# Patient Record
Sex: Male | Born: 1935 | Race: White | Hispanic: No | Smoking: Former smoker
Health system: Southern US, Community
[De-identification: ages and names within clinical notes are randomized; demographics above are authoritative.]

## PROBLEM LIST (undated history)

## (undated) DIAGNOSIS — Z9981 Dependence on supplemental oxygen: Secondary | ICD-10-CM

## (undated) DIAGNOSIS — I10 Essential (primary) hypertension: Secondary | ICD-10-CM

## (undated) DIAGNOSIS — I739 Peripheral vascular disease, unspecified: Secondary | ICD-10-CM

## (undated) DIAGNOSIS — C159 Malignant neoplasm of esophagus, unspecified: Secondary | ICD-10-CM

## (undated) DIAGNOSIS — J449 Chronic obstructive pulmonary disease, unspecified: Secondary | ICD-10-CM

## (undated) DIAGNOSIS — R2689 Other abnormalities of gait and mobility: Secondary | ICD-10-CM

## (undated) DIAGNOSIS — E538 Deficiency of other specified B group vitamins: Secondary | ICD-10-CM

## (undated) DIAGNOSIS — R911 Solitary pulmonary nodule: Secondary | ICD-10-CM

## (undated) DIAGNOSIS — I6529 Occlusion and stenosis of unspecified carotid artery: Secondary | ICD-10-CM

## (undated) DIAGNOSIS — D649 Anemia, unspecified: Secondary | ICD-10-CM

## (undated) DIAGNOSIS — I82409 Acute embolism and thrombosis of unspecified deep veins of unspecified lower extremity: Secondary | ICD-10-CM

## (undated) DIAGNOSIS — I219 Acute myocardial infarction, unspecified: Secondary | ICD-10-CM

## (undated) DIAGNOSIS — I2510 Atherosclerotic heart disease of native coronary artery without angina pectoris: Secondary | ICD-10-CM

## (undated) HISTORY — DX: Chronic obstructive pulmonary disease, unspecified: J44.9

## (undated) HISTORY — DX: Acute myocardial infarction, unspecified: I21.9

## (undated) HISTORY — DX: Other abnormalities of gait and mobility: R26.89

## (undated) HISTORY — DX: Atherosclerotic heart disease of native coronary artery without angina pectoris: I25.10

## (undated) HISTORY — PX: VASCULAR SURGERY: SHX849

## (undated) HISTORY — PX: PR VEIN BYPASS GRAFT,AORTO-FEM-POP: 35551

## (undated) HISTORY — DX: Solitary pulmonary nodule: R91.1

## (undated) HISTORY — DX: Occlusion and stenosis of unspecified carotid artery: I65.29

## (undated) HISTORY — DX: Acute embolism and thrombosis of unspecified deep veins of unspecified lower extremity: I82.409

---

## 1984-03-30 DIAGNOSIS — I219 Acute myocardial infarction, unspecified: Secondary | ICD-10-CM

## 1984-03-30 HISTORY — DX: Acute myocardial infarction, unspecified: I21.9

## 2001-01-14 ENCOUNTER — Ambulatory Visit (HOSPITAL_COMMUNITY): Admission: RE | Admit: 2001-01-14 | Discharge: 2001-01-14 | Payer: Self-pay | Admitting: Family Medicine

## 2002-12-18 ENCOUNTER — Ambulatory Visit (HOSPITAL_COMMUNITY): Admission: RE | Admit: 2002-12-18 | Discharge: 2002-12-18 | Payer: Self-pay | Admitting: Cardiovascular Disease

## 2002-12-18 ENCOUNTER — Encounter: Payer: Self-pay | Admitting: Cardiovascular Disease

## 2003-01-31 ENCOUNTER — Inpatient Hospital Stay (HOSPITAL_COMMUNITY): Admission: RE | Admit: 2003-01-31 | Discharge: 2003-02-02 | Payer: Self-pay

## 2006-03-30 HISTORY — PX: LUNG SURGERY: SHX703

## 2006-10-11 ENCOUNTER — Encounter: Payer: Self-pay | Admitting: Pulmonary Disease

## 2006-12-27 ENCOUNTER — Encounter: Payer: Self-pay | Admitting: Pulmonary Disease

## 2007-01-06 ENCOUNTER — Ambulatory Visit: Payer: Self-pay | Admitting: Pulmonary Disease

## 2007-01-10 ENCOUNTER — Encounter: Payer: Self-pay | Admitting: Pulmonary Disease

## 2007-01-10 ENCOUNTER — Ambulatory Visit: Admission: RE | Admit: 2007-01-10 | Discharge: 2007-01-10 | Payer: Self-pay | Admitting: Pulmonary Disease

## 2007-01-25 ENCOUNTER — Encounter: Payer: Self-pay | Admitting: Pulmonary Disease

## 2007-01-25 ENCOUNTER — Ambulatory Visit (HOSPITAL_COMMUNITY): Admission: RE | Admit: 2007-01-25 | Discharge: 2007-01-25 | Payer: Self-pay | Admitting: Pulmonary Disease

## 2007-01-28 ENCOUNTER — Ambulatory Visit: Payer: Self-pay | Admitting: Pulmonary Disease

## 2007-02-09 ENCOUNTER — Ambulatory Visit: Payer: Self-pay | Admitting: Thoracic Surgery

## 2007-02-21 ENCOUNTER — Inpatient Hospital Stay (HOSPITAL_COMMUNITY): Admission: RE | Admit: 2007-02-21 | Discharge: 2007-03-08 | Payer: Self-pay | Admitting: Thoracic Surgery

## 2007-02-21 ENCOUNTER — Ambulatory Visit: Payer: Self-pay | Admitting: Pulmonary Disease

## 2007-02-21 ENCOUNTER — Encounter: Payer: Self-pay | Admitting: Thoracic Surgery

## 2007-02-22 ENCOUNTER — Ambulatory Visit: Payer: Self-pay | Admitting: Thoracic Surgery

## 2007-03-11 ENCOUNTER — Inpatient Hospital Stay (HOSPITAL_COMMUNITY): Admission: EM | Admit: 2007-03-11 | Discharge: 2007-03-14 | Payer: Self-pay | Admitting: Emergency Medicine

## 2007-03-16 DIAGNOSIS — R0989 Other specified symptoms and signs involving the circulatory and respiratory systems: Secondary | ICD-10-CM

## 2007-03-16 DIAGNOSIS — J449 Chronic obstructive pulmonary disease, unspecified: Secondary | ICD-10-CM

## 2007-03-16 DIAGNOSIS — I219 Acute myocardial infarction, unspecified: Secondary | ICD-10-CM | POA: Insufficient documentation

## 2007-03-16 DIAGNOSIS — R0609 Other forms of dyspnea: Secondary | ICD-10-CM

## 2007-03-16 DIAGNOSIS — J984 Other disorders of lung: Secondary | ICD-10-CM

## 2007-03-17 ENCOUNTER — Ambulatory Visit: Payer: Self-pay | Admitting: Thoracic Surgery

## 2007-03-17 ENCOUNTER — Encounter: Admission: RE | Admit: 2007-03-17 | Discharge: 2007-03-17 | Payer: Self-pay | Admitting: Thoracic Surgery

## 2007-03-18 ENCOUNTER — Inpatient Hospital Stay (HOSPITAL_COMMUNITY): Admission: AD | Admit: 2007-03-18 | Discharge: 2007-03-23 | Payer: Self-pay | Admitting: Thoracic Surgery

## 2007-03-29 ENCOUNTER — Encounter: Admission: RE | Admit: 2007-03-29 | Discharge: 2007-03-29 | Payer: Self-pay | Admitting: Thoracic Surgery

## 2007-03-29 ENCOUNTER — Ambulatory Visit: Payer: Self-pay | Admitting: Thoracic Surgery

## 2007-03-31 HISTORY — PX: CORONARY ARTERY BYPASS GRAFT: SHX141

## 2007-03-31 HISTORY — PX: CAROTID ENDARTERECTOMY: SUR193

## 2007-04-06 ENCOUNTER — Ambulatory Visit: Payer: Self-pay | Admitting: Thoracic Surgery

## 2007-04-06 ENCOUNTER — Encounter: Admission: RE | Admit: 2007-04-06 | Discharge: 2007-04-06 | Payer: Self-pay | Admitting: Thoracic Surgery

## 2007-04-11 ENCOUNTER — Ambulatory Visit: Payer: Self-pay | Admitting: Pulmonary Disease

## 2007-04-15 ENCOUNTER — Telehealth (INDEPENDENT_AMBULATORY_CARE_PROVIDER_SITE_OTHER): Payer: Self-pay | Admitting: *Deleted

## 2007-04-25 ENCOUNTER — Telehealth (INDEPENDENT_AMBULATORY_CARE_PROVIDER_SITE_OTHER): Payer: Self-pay | Admitting: *Deleted

## 2007-05-01 ENCOUNTER — Ambulatory Visit: Payer: Self-pay | Admitting: Emergency Medicine

## 2007-05-01 ENCOUNTER — Inpatient Hospital Stay (HOSPITAL_COMMUNITY): Admission: EM | Admit: 2007-05-01 | Discharge: 2007-05-19 | Payer: Self-pay | Admitting: Emergency Medicine

## 2007-05-03 ENCOUNTER — Encounter: Payer: Self-pay | Admitting: Emergency Medicine

## 2007-05-04 ENCOUNTER — Encounter: Payer: Self-pay | Admitting: Internal Medicine

## 2007-05-05 ENCOUNTER — Ambulatory Visit: Payer: Self-pay | Admitting: Infectious Disease

## 2007-05-09 ENCOUNTER — Ambulatory Visit: Payer: Self-pay | Admitting: Internal Medicine

## 2007-05-27 ENCOUNTER — Ambulatory Visit: Payer: Self-pay | Admitting: Gastroenterology

## 2007-06-07 ENCOUNTER — Encounter: Admission: RE | Admit: 2007-06-07 | Discharge: 2007-06-07 | Payer: Self-pay | Admitting: Thoracic Surgery

## 2007-06-07 ENCOUNTER — Ambulatory Visit: Payer: Self-pay | Admitting: Thoracic Surgery

## 2007-06-22 ENCOUNTER — Ambulatory Visit: Payer: Self-pay | Admitting: Pulmonary Disease

## 2007-07-06 ENCOUNTER — Ambulatory Visit: Payer: Self-pay | Admitting: Pulmonary Disease

## 2007-07-19 ENCOUNTER — Telehealth: Payer: Self-pay | Admitting: Pulmonary Disease

## 2007-07-20 ENCOUNTER — Ambulatory Visit: Payer: Self-pay | Admitting: Pulmonary Disease

## 2007-07-20 DIAGNOSIS — I5021 Acute systolic (congestive) heart failure: Secondary | ICD-10-CM

## 2007-07-25 ENCOUNTER — Ambulatory Visit: Payer: Self-pay | Admitting: Pulmonary Disease

## 2007-08-04 ENCOUNTER — Encounter: Admission: RE | Admit: 2007-08-04 | Discharge: 2007-08-04 | Payer: Self-pay | Admitting: Cardiovascular Disease

## 2007-08-09 ENCOUNTER — Ambulatory Visit: Payer: Self-pay | Admitting: Thoracic Surgery (Cardiothoracic Vascular Surgery)

## 2007-08-09 ENCOUNTER — Encounter: Payer: Self-pay | Admitting: Thoracic Surgery (Cardiothoracic Vascular Surgery)

## 2007-08-09 ENCOUNTER — Inpatient Hospital Stay (HOSPITAL_COMMUNITY): Admission: AD | Admit: 2007-08-09 | Discharge: 2007-08-10 | Payer: Self-pay | Admitting: Cardiovascular Disease

## 2007-08-10 ENCOUNTER — Encounter: Payer: Self-pay | Admitting: Thoracic Surgery (Cardiothoracic Vascular Surgery)

## 2007-08-15 ENCOUNTER — Ambulatory Visit: Payer: Self-pay | Admitting: Thoracic Surgery (Cardiothoracic Vascular Surgery)

## 2007-08-17 ENCOUNTER — Inpatient Hospital Stay (HOSPITAL_COMMUNITY)
Admission: RE | Admit: 2007-08-17 | Discharge: 2007-08-29 | Payer: Self-pay | Admitting: Thoracic Surgery (Cardiothoracic Vascular Surgery)

## 2007-09-19 ENCOUNTER — Ambulatory Visit: Payer: Self-pay | Admitting: Thoracic Surgery (Cardiothoracic Vascular Surgery)

## 2007-09-19 ENCOUNTER — Encounter
Admission: RE | Admit: 2007-09-19 | Discharge: 2007-09-19 | Payer: Self-pay | Admitting: Thoracic Surgery (Cardiothoracic Vascular Surgery)

## 2007-09-22 ENCOUNTER — Encounter (HOSPITAL_COMMUNITY): Admission: RE | Admit: 2007-09-22 | Discharge: 2007-11-11 | Payer: Self-pay | Admitting: Cardiovascular Disease

## 2007-10-21 ENCOUNTER — Ambulatory Visit: Payer: Self-pay | Admitting: Pulmonary Disease

## 2007-10-21 DIAGNOSIS — J9611 Chronic respiratory failure with hypoxia: Secondary | ICD-10-CM

## 2007-11-03 ENCOUNTER — Encounter: Payer: Self-pay | Admitting: Pulmonary Disease

## 2007-12-21 ENCOUNTER — Encounter: Admission: RE | Admit: 2007-12-21 | Discharge: 2007-12-21 | Payer: Self-pay | Admitting: Cardiovascular Disease

## 2007-12-27 ENCOUNTER — Ambulatory Visit (HOSPITAL_COMMUNITY): Admission: RE | Admit: 2007-12-27 | Discharge: 2007-12-28 | Payer: Self-pay | Admitting: Cardiovascular Disease

## 2008-01-31 ENCOUNTER — Inpatient Hospital Stay (HOSPITAL_COMMUNITY): Admission: RE | Admit: 2008-01-31 | Discharge: 2008-02-01 | Payer: Self-pay | Admitting: Cardiovascular Disease

## 2008-02-20 ENCOUNTER — Ambulatory Visit: Payer: Self-pay | Admitting: Pulmonary Disease

## 2008-03-27 ENCOUNTER — Emergency Department (HOSPITAL_COMMUNITY): Admission: EM | Admit: 2008-03-27 | Discharge: 2008-03-27 | Payer: Self-pay | Admitting: Emergency Medicine

## 2008-08-21 ENCOUNTER — Ambulatory Visit: Payer: Self-pay | Admitting: Pulmonary Disease

## 2008-09-14 ENCOUNTER — Ambulatory Visit (HOSPITAL_COMMUNITY): Admission: RE | Admit: 2008-09-14 | Discharge: 2008-09-14 | Payer: Self-pay | Admitting: Cardiovascular Disease

## 2009-02-18 ENCOUNTER — Ambulatory Visit: Payer: Self-pay | Admitting: Pulmonary Disease

## 2009-03-28 ENCOUNTER — Ambulatory Visit (HOSPITAL_COMMUNITY): Admission: RE | Admit: 2009-03-28 | Discharge: 2009-03-28 | Payer: Self-pay | Admitting: Cardiovascular Disease

## 2009-04-17 ENCOUNTER — Encounter: Admission: RE | Admit: 2009-04-17 | Discharge: 2009-04-17 | Payer: Self-pay | Admitting: Cardiovascular Disease

## 2009-04-22 ENCOUNTER — Ambulatory Visit (HOSPITAL_COMMUNITY): Admission: RE | Admit: 2009-04-22 | Discharge: 2009-04-22 | Payer: Self-pay | Admitting: Cardiovascular Disease

## 2009-07-02 ENCOUNTER — Ambulatory Visit: Payer: Self-pay | Admitting: Pulmonary Disease

## 2009-07-05 IMAGING — CT NM PET TUM IMG SKULL BASE T - THIGH
6 series · 25 of 25 positions shown · IV contrast (350 OM)
Comparison: Outside CT report from [HOSPITAL] dated 12/27/2006

FDG PET-CT TUMOR IMAGING (WHOLE BODY):

Fasting Blood Glucose:  107

CLINICAL DATA: Possible left upper lobe lung nodule on outside CT
TECHNIQUE: 16.6 mCi F-18 FDG was injected intravenously via the right a.c. . 
Full-ring PET imaging was performed from the skull base through the lower
extremities 78 minutes after injection.  CT data was obtained and used for
attenuation correction and anatomic localization only.  (This was not acquired
as a diagnostic CT examination.)

[Series 1: pet ac · axial · 3.3mm · 4.69mm/px · z∈[-870,+0]mm · 5 of 267 slices shown]
[im 1/267]
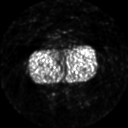
[im 67/267]
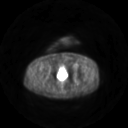
[im 134/267]
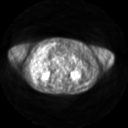
[im 200/267]
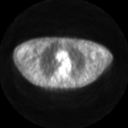
[im 267/267]
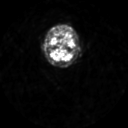

[Series 2: ct images · axial · 3.8mm · 0.98mm/px · z∈[-870,+0]mm · 5 of 267 slices shown]
[im 1/267]
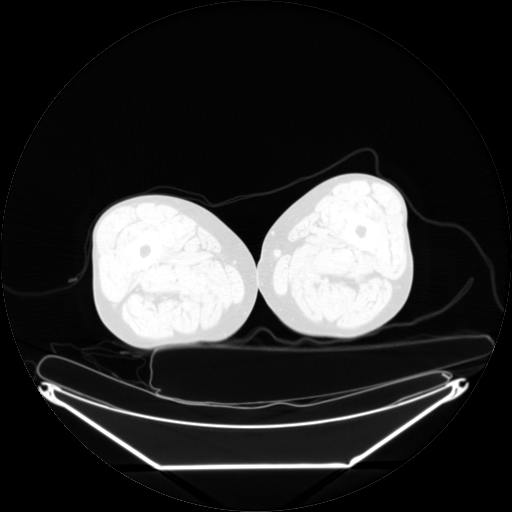
[im 67/267]
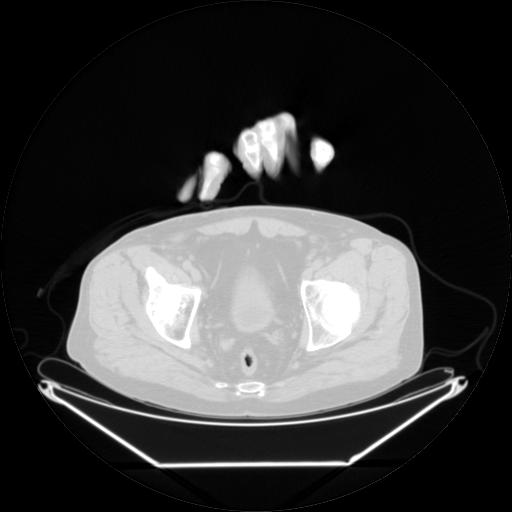
[im 134/267]
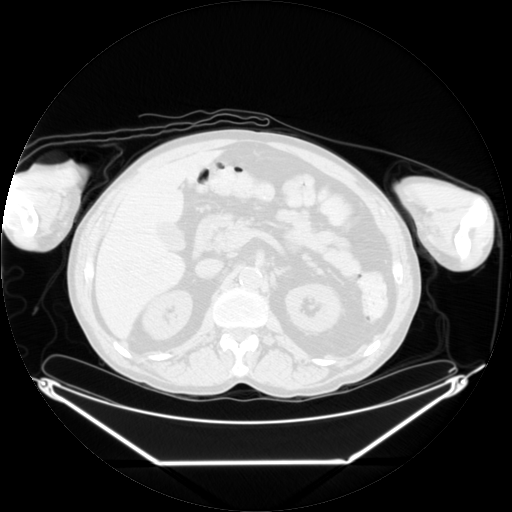
[im 200/267]
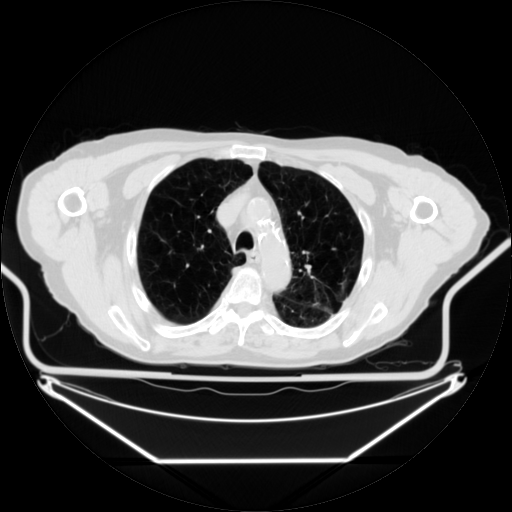
[im 267/267]
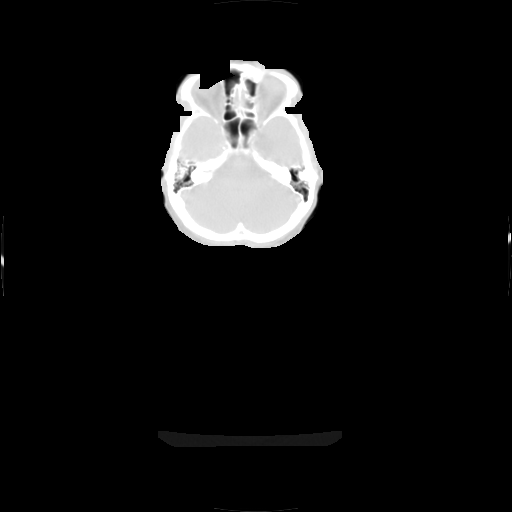

[Series 2: pet nac · axial · 3.3mm · 4.69mm/px · z∈[-870,+0]mm · 6 of 267 slices shown]
[im 1/267]
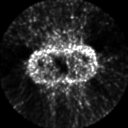
[im 54/267]
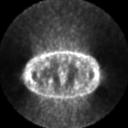
[im 107/267]
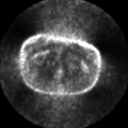
[im 160/267]
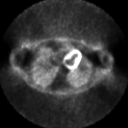
[im 213/267]
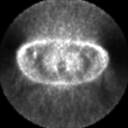
[im 267/267]
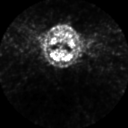

[Series 123: mip · coronal · 3.3mm · 4.69mm/px · 1 of 30 slices shown]
[im 1/30]
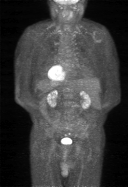

[Series 151: reformatted · axial · 3.3mm · 3.91mm/px · z∈[-870,+0]mm · 6 of 265 slices shown (1 of 2)]
[im 1/265]
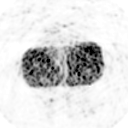
[im 53/265]
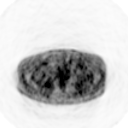
[im 106/265]
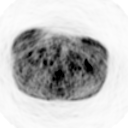
[im 159/265]
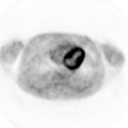
[im 212/265]
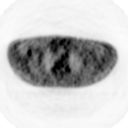
[im 265/265]
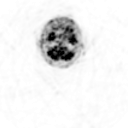

[Series 153: reformatted · coronal · 4.7mm · 6.98mm/px · 2 of 77 slices shown (2 of 2)]
[im 1/77]
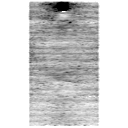
[im 77/77]
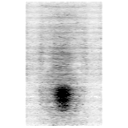

[25 of 25 positions shown; findings below may reference images not displayed]

FINDINGS: PET images demonstrate mild asymmetric right greater than left
palatine tonsil activity, likely physiologic, without correlative CT mass.

Extensive hypermetabolic-Brown fat. Apparent activity at the left posterior
first rib is felt to be related to surrounding hypermetabolic fat. Less marked
hypermetabolism surrounds more inferior posterior ribs as well. Degenerative
metabolism at the right glenohumeral joint.

Corresponding to a spiculated left upper lobe lung nodule at approximately 1.0 x
1.2 cm is hypermetabolism 2.8 g/mL. Image 63. Immediately medially is a probable
satellite nodule at 6 mm.

More posteriorly and inferiorly in the left upper lobe is a spiculated nodule
measuring 5 mm on image 70. This is faintly visible on PET at 1.1 g/mL.

A low left mediastinal node measures 9 mm on image 89. Mild hypermetabolism
g/mL. Slightly hypermetabolic to the surrounding mediastinal pool.

No activity within the right adrenal adenoma.

Two foci of hypermetabolism within the rectosigmoid region. The more superior
measures 4.2 and corresponds to an area of underdistention. Just inferior to
this is a focal area of hypermetabolism at 4.5 grams per milliliter. Concurrent
soft tissue density on image 193 could relate to retained stool or a small
polyp.

CT images performed for attenuation correction demonstrate no significant
findings in the neck. Moderate coronary artery atherosclerosis.

Moderate centrilobular emphysema. 4 mm posterior right upper lobe lung nodule on
image 79.

Right adrenal nodule measures approximately -8 Hounsfield units, consistent with
an adenoma. Tiny gallstones. Mild bladder wall thickening. Moderate
prostatomegaly. Right inguinal hernia contains fat.
IMPRESSION: 1. The largest left upper lobe lung nodule demonstrates malignant range activity
suspicious for bronchogenic carcinoma. A immediately adjacent smaller nodule is
likely a satellite.
2. More lateral left upper lobe smaller nodule may be too small to entirely
characterized by PET. A intrapulmonary metastasis cannot be excluded.
3. Isolated equivocal left mediastinal lymph node warrants followup attention.
4. No extra thoracic disease.
5. Right adrenal adenoma.
6. Right upper lobe 4 mm lung nodule, not mentioned on outside CT report.
7. Two foci of hypermetabolism within the rectosigmoid colon. these could be
physiologic, given their focality, polyps cannot be excluded. Correlate with
gastrointestinal symptoms and possibly sigmoidoscopy or colonoscopy.

## 2009-08-19 ENCOUNTER — Ambulatory Visit: Payer: Self-pay | Admitting: Pulmonary Disease

## 2010-02-18 ENCOUNTER — Telehealth: Payer: Self-pay | Admitting: Pulmonary Disease

## 2010-04-09 ENCOUNTER — Ambulatory Visit
Admission: RE | Admit: 2010-04-09 | Discharge: 2010-04-09 | Payer: Self-pay | Source: Home / Self Care | Attending: Pulmonary Disease | Admitting: Pulmonary Disease

## 2010-04-20 ENCOUNTER — Encounter: Payer: Self-pay | Admitting: Thoracic Surgery

## 2010-04-29 NOTE — Assessment & Plan Note (Signed)
Summary: rov for emphysema/chronic respiratory failure.   Copy to:  Allyson Sabal Primary Provider/Referring Provider:  Aida Puffer,  [place], [place]  CC:  Pt is here for a 6 month f/u appt.  Pt saw TP for a sick visit on 07-02-2009.  Pt c/o sob with exertion and also feels "dizzy while walking."   Pt c/o coughing up cream colored sputum.   Pt denied chest pain or tightness in chest.  .  History of Present Illness: the patient comes in today for followup of his known emphysema with chronic respiratory failure.he was recently seen by our nurse practitioner for atypical chest pain, but responded quite well to nonsteroidals and a heating pad. His chest discomfort has totally resolved. The patient states that his dyspnea on exertion is at his usual baseline, and he only has an intermittent cough with occasional cream colored sputum production. He is wearing his oxygen compliantly, and is staying on his usual bronchodilator regimen.  Current Medications (verified): 1)  Advair Diskus 250-50 Mcg/dose  Misc (Fluticasone-Salmeterol) .... Inhale 1 Puff Two Times A Day 2)  Proair Hfa 108 (90 Base) Mcg/act  Aers (Albuterol Sulfate) .... Inhale 2 Puffs Every 4 To 6 Hours As Needed 3)  Metoprolol Tartrate 25 Mg  Tabs (Metoprolol Tartrate) .... Take 1/2 Tab By Mouth Two Times A Day 4)  Lanoxin 0.125 Mg  Tabs (Digoxin) .... Take 1 Tablet By Mouth Once A Day 5)  Lipitor 40 Mg Tabs (Atorvastatin Calcium) .[name].. 1 By Mouth Once Daily 6)  Plavix 75 Mg Tabs (Clopidogrel Bisulfate) .... Take 1 Tablet By Mouth Once A Day 7)  Mobic 15 Mg Tabs (Meloxicam) .... Take 1 Tablet By Mouth Once A Day 8)  Folic Acid 1 Mg Tabs (Folic Acid) .... Take 1 Tablet By Mouth Once A Day 9)  Aspirin Low Dose 81 Mg Tabs (Aspirin) .... Take 1 Tablet By Mouth Once A Day 10)  Albuterol Sulfate (2.5 Mg/67ml) 0.083% Nebu (Albuterol Sulfate) .... Four Times A Day in Nebulizer As Needed  Allergies (verified): No Known Drug Allergies  Review of Systems    The patient complains of shortness of breath with activity, productive cough, acid heartburn, and sneezing.  The patient denies shortness of breath at rest, non-productive cough, coughing up blood, chest pain, irregular heartbeats, indigestion, loss of appetite, weight change, abdominal pain, difficulty swallowing, sore throat, tooth/dental problems, headaches, nasal congestion/difficulty breathing through nose, itching, ear ache, anxiety, depression, hand/feet swelling, joint stiffness or pain, rash, change in color of mucus, and fever.    Vital Signs:  Patient profile:   77 year old male Height:      67 inches Weight:      174 pounds BMI:     27.35 O2 Sat:      95 % on 3 LPM pulsed Temp:     97.6 degrees F oral Pulse rate:   69 / minute BP sitting:   134 / 68  (left arm) Cuff size:   regular  Vitals Entered By: Arman Filter LPN (Aug 19, 2009 11:07 AM)  O2 Flow:  3 LPM pulsed CC: Pt is here for a 6 month f/u appt.  Pt saw TP for a sick visit on 07-02-2009.  Pt c/o sob with exertion and also feels "dizzy while walking."   Pt c/o coughing up cream colored sputum.   Pt denied chest pain or tightness in chest.   Comments Medications reviewed with patient Arman Filter LPN  Aug 19, 2009 11:07 AM  Physical Exam  General:  ow male in nad Lungs:  decreased bs, but no wheezing or rhonchi Heart:  rrr, no mrg Extremities:  no edema or cyanosis Neurologic:  alert and oriented, moves all 4.   Impression & Recommendations:  Problem # 1:  EMPHYSEMA (ICD-492.8) the patient is maintaining a stable baseline on his current treatment. He has had no recent acute exacerbation or pulmonary infection. I think the most important thing that he can do at this point is to work aggressively on a conditioning and exercise program. He has been through pulmonary rehabilitation at one point, and thought it helped quite a bit. I have offered to refer him back for another session. I have recommended no change in  his current medications, and he is to followup with me in 6 months.  Medications Added to Medication List This Visit: 1)  Albuterol Sulfate (2.5 Mg/8ml) 0.083% Nebu (Albuterol sulfate) .... Four times a day in nebulizer as needed  Other Orders: Est. Patient Level III (62952) Prescription Created Electronically [phone])  Patient Instructions: 1)  no change in breathing medications 2)  think about pulmonary rehab, and will refer you. 3)  followup with me in 6mos.  Prescriptions: PROAIR HFA 108 (90 BASE) MCG/ACT  AERS (ALBUTEROL SULFATE) inhale 2 puffs every 4 to 6 hours as needed  #1 x 6   Entered and Authorized by:   Barbaraann Share MD   Signed by:   Barbaraann Share MD on 08/19/2009   Method used:   Faxed to ...       [facility] (retail)       [address]       [place], [place]  [address]       Ph: [phone] or [phone]       Fax: [phone]   RxID:   [id number] ADVAIR DISKUS 250-50 MCG/DOSE  MISC (FLUTICASONE-SALMETEROL) Inhale 1 puff two times a day  #1 x 6   Entered and Authorized by:   Barbaraann Share MD   Signed by:   Barbaraann Share MD on 08/19/2009   Method used:   Faxed to ...       [facility] (retail)       [address]       [place], [place]  [address]       Ph: [phone] or [phone]       Fax: [phone]   RxID:   [id number]

## 2010-04-29 NOTE — Assessment & Plan Note (Signed)
Summary: Acute NP office visit - "lung pain"   Copy to:  Allyson Sabal Primary Provider/Referring Provider:  Aida Puffer,  [place], [place]  CC:  sharp, tight pain in chest that pt states wrapped all the way around his ribs that pt rates as a 10 out of 10; occured Wednesday night that lasted x56min irritated with movement, no episodes since but having residual pains.  pt denies any changes in his breathing that include dyspnea, and wheezing or cough.  .  History of Present Illness: 77 yo with known hx emphysema with chronic respiratory failure.    July 02, 2009 --Presents for an acute office  visit. Complains of sharp, tight pain in chest that pt states wrapped all the way around his ribs that pt rates as a 10 out of 10; occured 1 week ago,  that lasted x41min irritated with movement, no episodes since but having residual pains.  pt denies any changes in his breathing that include dyspnea, wheezing or cough.  Would like tylox- it worked well 2 years ago,  took mobic without much help. Felt like a  tight band around ribs 1 week ago -lasted 5 min then resolved. Worse with movement. Now ribs are sore, w/ coughing. Feels much better. Breathing is at baseline. No xray >2 yrs. Denies orthopnea, hemoptysis, fever, n/v/d, edema, headache,recent travel or antibiotics.  Medications Prior to Update: 1)  Advair Diskus 250-50 Mcg/dose  Misc (Fluticasone-Salmeterol) .... Inhale 1 Puff Two Times A Day 2)  Proair Hfa 108 (90 Base) Mcg/act  Aers (Albuterol Sulfate) .... Inhale 2 Puffs Every 4 To 6 Hours As Needed 3)  Metoprolol Tartrate 25 Mg  Tabs (Metoprolol Tartrate) .... Take 1/2 Tab By Mouth Two Times A Day 4)  Lanoxin 0.125 Mg  Tabs (Digoxin) .... Take 1 Tablet By Mouth Once A Day 5)  Lipitor 40 Mg Tabs (Atorvastatin Calcium) .[name].. 1 By Mouth Once Daily 6)  Plavix 75 Mg Tabs (Clopidogrel Bisulfate) .... Take 1 Tablet By Mouth Once A Day 7)  Mobic 15 Mg Tabs (Meloxicam) .... Take 1 Tablet By Mouth Once A Day 8)  Folic  Acid 1 Mg Tabs (Folic Acid) .... Take 1 Tablet By Mouth Once A Day 9)  Aspirin Low Dose 81 Mg Tabs (Aspirin) .... Take 1 Tablet By Mouth Once A Day 10)  Albuterol Sulfate (2.5 Mg/62ml) 0.083% Nebu (Albuterol Sulfate) .... Four Times A Day in Nebulizer 11)  Budesonide .... Two Times A Day in Nebulizer  Current Medications (verified): 1)  Advair Diskus 250-50 Mcg/dose  Misc (Fluticasone-Salmeterol) .... Inhale 1 Puff Two Times A Day 2)  Proair Hfa 108 (90 Base) Mcg/act  Aers (Albuterol Sulfate) .... Inhale 2 Puffs Every 4 To 6 Hours As Needed 3)  Metoprolol Tartrate 25 Mg  Tabs (Metoprolol Tartrate) .... Take 1/2 Tab By Mouth Two Times A Day 4)  Lanoxin 0.125 Mg  Tabs (Digoxin) .... Take 1 Tablet By Mouth Once A Day 5)  Lipitor 40 Mg Tabs (Atorvastatin Calcium) .[name].. 1 By Mouth Once Daily 6)  Plavix 75 Mg Tabs (Clopidogrel Bisulfate) .... Take 1 Tablet By Mouth Once A Day 7)  Mobic 15 Mg Tabs (Meloxicam) .... Take 1 Tablet By Mouth Once A Day 8)  Folic Acid 1 Mg Tabs (Folic Acid) .... Take 1 Tablet By Mouth Once A Day 9)  Aspirin Low Dose 81 Mg Tabs (Aspirin) .... 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, inflammatory, +MAC EMPHYSEMA (ICD-492.8) DYSPNEA ON EXERTION (ICD-786.09) MYOCARDIAL INFARCTION (ICD-410.90)  Family History: Last updated: 07/02/2009 heart disease - father DM - sister  Social History: Last updated: 07/02/2009 former smoker, quit 2008 x26yrs 2ppd drinks alcohol occasionally divorced 5 children retired: Public house manager  Risk Factors: Smoking Status: quit (04/11/2007) Packs/Day: 1 1/2 ppd (03/16/2007)  Family History: heart disease - father DM - sister  Social History: former  smoker, quit 2008 x20yrs 2ppd drinks alcohol occasionally divorced 5 children retired: Public house manager  Review of Systems      See HPI  Vital Signs:  Patient profile:   77 year old male Height:      67 inches Weight:      176 pounds BMI:     27.67 O2 Sat:      98 % on 3 L/min pulsing Temp:     96.8 degrees F oral Pulse rate:   74 / minute BP sitting:   126 / 70  (left arm) Cuff size:   regular  Vitals Entered By: Boone Master CNA (July 02, 2009 11:42 AM)  O2 Flow:  3 L/min pulsing CC: sharp, tight pain in chest that pt states wrapped all the way around his ribs that pt rates as a 10 out of 10; occured Wednesday night that lasted x59min irritated with movement, no episodes since but having residual pains.  pt denies any changes in his breathing that include dyspnea, wheezing or cough.   Is Patient Diabetic? No Comments Medications reviewed with patient Daytime contact number verified with patient. Boone Master CNA  July 02, 2009 11:43 AM    Physical Exam  Additional Exam:  GEN: A/Ox3; pleasant , NAD HEENT:  [place]/AT, , EACs-clear, TMs-wnl, NOSE-clear, THROAT-clear NECK:  Supple w/ fair ROM; no JVD; normal carotid impulses w/o bruits; no thyromegaly or nodules palpated; no lymphadenopathy. RESP  Clear to P & A; w/o, wheezes/ rales/ or rhonchi., decreased bases  CARD:  RRR, no m/r/g   GI:   Soft & nt; nml bowel sounds; no organomegaly or masses detected. Musco: Warm bil,  no calf tenderness edema, clubbing, pulses intact, tender along posterior back, no eccymosis or rash.  Neuro: intact w/ no focal deficits noted.     Impression & Recommendations:  Problem # 1:  EMPHYSEMA (ICD-492.8) Atypical chest pain suspect musculoskeletal in nature. check cxr today COPD appears to be stable.  REC:  Warm heat to back.  Advil 200mg  2 tabs two times a day for 5 days, take w/ food.  Please contact office for sooner follow up if symptoms do not improve or worsen  follow up Dr.  Shelle Iron as scheduled.    Orders: T-2 View CXR (71020TC) Est. Patient Level IV (84132)  Complete Medication List: 1)  Advair Diskus 250-50 Mcg/dose Misc (Fluticasone-salmeterol) .... Inhale 1 puff two times a day 2)  Proair Hfa 108 (90 Base) Mcg/act Aers (Albuterol sulfate) .... Inhale 2 puffs every 4 to 6 hours as needed 3)  Metoprolol Tartrate 25 Mg Tabs (Metoprolol tartrate) .... Take 1/2 tab by mouth two times a day 4)  Lanoxin 0.125 Mg Tabs (Digoxin) .... Take 1 tablet by mouth once a day 5)  Lipitor 40 Mg Tabs (Atorvastatin calcium) .[name].. 1 by mouth once daily 6)  Plavix 75 Mg Tabs (Clopidogrel bisulfate) .Marland KitchenMarland KitchenMarland Kitchen  Take 1 tablet by mouth once a day 7)  Mobic 15 Mg Tabs (Meloxicam) .... Take 1 tablet by mouth once a day 8)  Folic Acid 1 Mg Tabs (Folic acid) .... Take 1 tablet by mouth once a day 9)  Aspirin Low Dose 81 Mg Tabs (Aspirin) .... Take 1 tablet by mouth once a day 10)  Albuterol Sulfate (2.5 Mg/16ml) 0.083% Nebu (Albuterol sulfate) .... Four times a day in nebulizer  Patient Instructions: 1)  Warm heat to back.  2)  Advil 200mg  2 tabs two times a day for 5 days, take w/ food.  3)  Please contact office for sooner follow up if symptoms do not improve or worsen  4)  follow up Dr. Shelle Iron as scheduled.

## 2010-04-29 NOTE — Progress Notes (Signed)
Summary: nos appt  Phone Note Call from Patient   Caller: [email or web address]  Call For: clance Summary of Call: In ref to 11/21 nos, pt has appt on 04/09/2010. Initial call taken by: Darletta Moll,  February 18, 2010 9:08 AM

## 2010-05-01 NOTE — Assessment & Plan Note (Signed)
Summary: rov for emphysema   Visit Type:  Follow-up Copy to:  Allyson Sabal Primary Provider/Referring Provider:  Aida Puffer,  [place], [place]  CC:  follow up. Pt states his breathing has unchanged. pt denies any cough. Still has occas wheezing. [name]  History of Present Illness: the pt comes in today for f/u of his known emphysema.  He is continuing on his bronchodilators and oxygen, and is trying to stay active.  He feels he is doing fairly well overall, maintaining a functional baseline.  He denies any chest congestion or purulent mucus.  Current Medications (verified): 1)  Advair Diskus 250-50 Mcg/dose  Misc (Fluticasone-Salmeterol) .... Inhale 1 Puff Two Times A Day 2)  Proair Hfa 108 (90 Base) Mcg/act  Aers (Albuterol Sulfate) .... Inhale 2 Puffs Every 4 To 6 Hours As Needed 3)  Lanoxin 0.125 Mg  Tabs (Digoxin) .... Take 1 Tablet By Mouth Once A Day 4)  Plavix 75 Mg Tabs (Clopidogrel Bisulfate) .... Take 1 Tablet By Mouth Once A Day 5)  Mobic 15 Mg Tabs (Meloxicam) .... Take 1 Tablet By Mouth Once A Day 6)  Folic Acid 1 Mg Tabs (Folic Acid) .... Take 1 Tablet By Mouth Once A Day 7)  Aspirin Low Dose 81 Mg Tabs (Aspirin) .... Take 1 Tablet By Mouth Once A Day 8)  Albuterol Sulfate (2.5 Mg/88ml) 0.083% Nebu (Albuterol Sulfate) .... Four Times A Day in Nebulizer As Needed  Allergies (verified): No Known Drug Allergies  Review of Systems       The patient complains of shortness of breath with activity, hand/feet swelling, and joint stiffness or pain.  The patient denies shortness of breath at rest, productive cough, non-productive cough, coughing up blood, chest pain, irregular heartbeats, acid heartburn, indigestion, loss of appetite, weight change, abdominal pain, difficulty swallowing, sore throat, tooth/dental problems, headaches, nasal congestion/difficulty breathing through nose, sneezing, itching, ear ache, anxiety, depression, rash, change in color of mucus, and fever.    Vital  Signs:  Patient profile:   77 year old male Height:      67 inches Weight:      178.38 pounds BMI:     28.04 O2 Sat:      90 % on 3 L/min pulsed Temp:     98.3 degrees F oral Pulse rate:   77 / minute BP sitting:   120 / 68  (left arm) Cuff size:   regular  Vitals Entered By: Carver Fila (April 09, 2010 2:26 PM)  O2 Flow:  3 L/min pulsed CC: follow up. Pt states his breathing has unchanged. pt denies any cough. Still has occas wheezing.  Comments meds and allergies updated Phone number updated Carver Fila  April 09, 2010 2:27 PM    Physical Exam  General:  thin male in nad Lungs:  decreased bs with faint basilar crackles. no wheezing noted. Heart:  rrr Extremities:  no significant edema or cyanosis  Neurologic:  alert and oriented, moves all 4.   Impression & Recommendations:  Problem # 1:  EMPHYSEMA (ICD-492.8)  the pt is maintaining a stable baseline on his current meds.  I have stressed the need to work on some type of conditioning program in order to maximize QOL.    Problem # 2:  CHRONIC RESPIRATORY FAILURE (ICD-518.83)  adequate sats on current oxygen therapy  Other Orders: Est. Patient Level III (40981)  Patient Instructions: 1)  no change in meds 2)  try to stay as active as possible. 3)  followup with  me in 6mos or sooner if having issues.   Immunization History:  Influenza Immunization History:    Influenza:  historical (03/18/2010)

## 2010-08-12 NOTE — Letter (Signed)
March 29, 2007   Barbaraann Share, MD,FCCP  [address]  [place], [place] [address]   Re:  [name], ANACKER                  DOB:  Jun 02, 1931   Dear Mellody Dance:   I saw the patient back today after three hospitalizations.  It looks  like his lung is healed.  We removed his last chest tube sutures.  He is  breathing better.  He is off oxygen except at night.  His blood sats  were 92%, his blood pressure was 116/77, pulse 100, respirations 18, and  chest x-ray showed just normal postoperative changes.  I will plan to  see him back again in 1 week to continue a close followup.   [name], M.D.  Electronically Signed   DPB/MEDQ  D:  03/29/2007  T:  03/29/2007  Job:  [id number]

## 2010-08-12 NOTE — Op Note (Signed)
[name], [name]                ACCOUNT NO.:  [id number]   MEDICAL RECORD NO.:  [id number]          PATIENT TYPE:  INP   LOCATION:  2306                         FACILITY:  [facility]   PHYSICIAN:  Salvatore Decent. Cornelius Moras, M.D. DATE OF BIRTH:  [birth year]   DATE OF PROCEDURE:  08/17/2007  DATE OF DISCHARGE:                               OPERATIVE REPORT   PREOPERATIVE DIAGNOSES:  1. Left main disease with three-vessel coronary artery disease.  2. Ischemic cardiomyopathy.   POSTOPERATIVE DIAGNOSES:  1. Left main disease with three-vessel coronary artery disease.  2. Ischemic cardiomyopathy.   PROCEDURE:  Median sternotomy for coronary artery bypass grafting x3  (left internal mammary artery to distal left anterior descending  coronary artery, saphenous vein graft to circumflex marginal branch,  saphenous vein graft to right posterolateral branch, and endoscopic  saphenous vein harvest from right thigh)   SURGEON:  Salvatore Decent. Cornelius Moras, MD   ASSISTANTS:  1. Salvatore Decent Dorris Fetch, MD  2. Theda Belfast, PA   ANESTHESIA:  General.   BRIEF CLINICAL NOTE:  The patient is a 77 year old male with known  history of coronary artery disease, peripheral vascular disease,  hypertension, hyperlipidemia, and congestive heart failure.  The patient  suffered a myocardial infarction many years ago and was treated  medically at that time.  In November 2008, the patient underwent left  VATS and thoracotomy for wedge resection of what turned out to be a  benign left upper lobe lung lesion.  Following this, the patient had a  very long and complicated postoperative convalescence culminating in an  episode of severe sepsis and congestive heart failure complicated by  acute respiratory failure and acute renal failure.  This all developed  over the course of several months.  Ultimately, the patient recovered.  During his convalescence, echocardiogram was performed demonstrating  global left ventricular  dysfunction with ejection fraction estimated  25%.  The patient subsequently underwent elective cardiac  catheterization by Dr. Nanetta Batty.  He was found to have critical  left main disease with three-vessel coronary artery disease and ischemic  cardiomyopathy.  A full consultation note has been dictated previously.  The patient has been counseled at length regarding the indications,  risks, and potential benefits of surgery.  Alternative treatment  strategies have been discussed.  He understands and accepts all  associated risks of surgery and desires to proceed as described.   OPERATIVE FINDINGS:  1. Moderate-to-severe left ventricular dysfunction with ejection      fraction estimated 25%.  2. Trace mitral regurgitation.  3. Good quality left internal mammary artery and saphenous vein      conduit for grafting.  4. Good quality target vessels for grafting.   OPERATIVE NOTE IN DETAIL:  The patient was brought to the operating room  on above-mentioned date and central monitoring was established by the  anesthesia service under the care and direction of Dr. Kipp Brood.  Specifically, a Swan-Ganz catheter was placed through the right internal  jugular approach.  A radial arterial line was placed.  Intravenous  antibiotics were administered.  Following induction with  general  endotracheal anesthesia, a Foley catheter was placed.  The patient's  chest, abdomen, both groins, and both lower extremities were prepared  and draped in a sterile manner.  Baseline transesophageal echocardiogram  was performed by Dr. Noreene Larsson.  This demonstrates global left ventricular  dysfunction with moderately dilated left ventricle.  The inferior wall  appears akinetic.  Ejection fraction is estimated 25%.  There is trace  mitral regurgitation.  No other significant abnormalities were noted.   The median sternotomy incision was performed and left internal mammary  artery was dissected from the chest wall  and prepared for bypass  grafting.  The left internal mammary artery was good-quality conduit.  Simultaneously, saphenous vein was obtained from the patient's right  thigh using endoscopic vein harvest technique through a small incision  made just below the right knee.  The saphenous vein was good-quality  conduit.  After the saphenous vein had been removed, the small incision  in the right lower extremities closed in multiple layers with running  absorbable suture.  The patient was heparinized systemically.  The left  internal mammary artery was transected distally and noted to have  excellent flow.   The pericardium was opened.  There was severe atherosclerotic plaque in  the transverse aortic arch, but that the ascending thoracic aorta  appears fairly normal.  The ascending thoracic aorta was cannulated  uneventfully.  A venous cannula was placed through the tip of the right  atrium.  A retrograde cardioplegic catheter was placed through the right  atrium into the coronary sinus.  Cardiopulmonary bypass was begun and  distal target sites were selected for coronary bypass grafting.  There  was some scar involving the inferior wall of the heart consistent with  remote history of myocardial infarction.  A temperature probe was placed  in left ventricular septum and a cardioplegic catheter was placed in the  ascending aorta.   The patient was allowed to cool passively to 32 degrees systemic  temperature.  The aortic crossclamp was applied and cold blood  cardioplegia is delivered initially in antegrade fashion through the  aortic root.  Iced saline slush was applied for topical hypothermia and  supplemental cardioplegia was administered retrograde through the  coronary sinus catheter to maintain left ventricular septal temperature  below 15 degrees centigrade.  The intermittent doses of cardioplegia  administered intermittently throughout the crossclamp portion of the  operation through  the aortic root, down the subsequently placed vein  grafts, and retrograde through the coronary sinus catheter.  The initial  cardioplegic arrest and myocardial cooling was felt be excellent.   The following distal coronary anastomoses were performed:  1. The circumflex marginal branch is grafted with a saphenous vein      graft in end-to-side fashion.  This vessel measures 2.0 mm in      diameter and is a good-quality target vessel for grafting.  2. The posterolateral branch of the distal right coronary artery is      grafted with saphenous vein graft in end-to-side fashion.  This      vessel measured 1.9 mm in diameter and is a good-quality target      vessel for grafting.  3. The left anterior descending coronary artery is grafted with left      internal mammary artery in end-to-side fashion.  This vessel      measured 2.0 mm in diameter and is a good-quality target vessel for      grafting.   Both  proximal saphenous vein anastomoses were performed directly to the  ascending aorta prior to removal of the aortic crossclamp.  The left  ventricular septal temperature rises rapidly with reperfusion of the  left internal mammary artery.  One final dose of warm retrograde hot  shot cardioplegia was administered.  The aortic crossclamp was removed  after a total crossclamp time of 50 minutes.  The heart begins to beat  spontaneously and a slow junctional escape rhythm resumes.  All proximal  and distal coronary anastomoses were inspected for hemostasis and  appropriate graft orientation.  Epicardial pacing wires fixed to the  right ventricular free wall into the right atrial appendage.  The  patient was rewarmed to 37 degrees centigrade temperature.  Low-dose  milrinone and dopamine infusions are begun.  The patient was weaned from  cardiopulmonary bypass without difficulty.  The patient's rhythm at  separation from bypass is a slow junctional escape rhythm.  AV  sequential pacing was  employed.  Total cardiopulmonary bypass time of  the operation is 65 minutes.   Followup transesophageal echocardiogram performed by Dr. Noreene Larsson after  separation from bypass demonstrates some improvement in left ventricular  function.  No other abnormalities are noted.   The venous and arterial cannulae were removed uneventfully.  Protamine  was administered to reverse anticoagulation.  The mediastinum and the  left chest were irrigated with saline solution containing vancomycin.  Meticulous surgical hemostasis was ascertained.  The mediastinum and  left chest were drained with three chest tubes exited through separate  stab incisions.  The pericardium soft tissues anterior to the aorta were  reapproximated loosely.  The sternum was closed with double-strength  sternal wire.  The soft tissues anterior to the sternum were closed in  multiple layers and the skin was closed with a running subcuticular skin  closure.   The patient tolerated the procedure well and was transported to the  surgical intensive care unit in stable condition.  There were no  intraoperative complications.  All sponge, instrument, and needle counts  were verified and correct at the completion of the operation.  The  patient was transfused 2 units of packed red blood cells during  cardiopulmonary bypass due to anemia, which was present prior to surgery  and exacerbated with hemodilution.      Salvatore Decent. Cornelius Moras, M.D.  Electronically Signed     CHO/MEDQ  D:  08/17/2007  T:  08/17/2007  Job:  [id number]   cc:   Nanetta Batty, M.D.  Barbaraann Share, MD,FCCP  Dr. Thora Lance, M.D.

## 2010-08-12 NOTE — Assessment & Plan Note (Signed)
[facility]                             PULMONARY OFFICE NOTE   NAME:[name], [name]                         MRN:          [id number]  DATE:01/06/2007                            DOB:          [birth year]    HISTORY OF PRESENT ILLNESS:  The patient is a very pleasant 77 year old  gentleman who I have been asked to see for an abnormal CT scan as well  as dyspnea.  The patient has had an abnormal area in the left upper lobe  that was noted on prior CT scan.  The patient subsequently has had a 2-  to 34-month followup and shows increasing size of the density as well as  spiculation that is concerning for possible malignancy.  The patient has  had no chest pain or hemoptysis and has had an excellent appetite.  He  has not been losing weight.  He has no significant cough but does  produce white foamy mucus throughout the day.  The patient also has a  long history of smoking and significant dyspnea on exertion.  He states  that he would probably get short of breath at one block on flat ground  at a moderate pace.  However, he has no difficulties with his easier day-  to-day activities.  He also notes day-to-day variability.   PAST MEDICAL HISTORY:  Significant for:  1. Myocardial infarction in 1986.  2. History of what sounds like a vascular occlusion that failed      interventional approach and required vascular surgery approximately      3+ years ago.   CURRENT MEDICATIONS:  1. Albuterol inhaler two puffs daily.  2. Flovent HFA two puffs b.i.d.   The patient has no known drug allergies.   SOCIAL HISTORY:  He has a history of smoking one-and-a-half packs per  day or more for the last 56 years.  He is retired from [employer] work and  lives alone.   FAMILY HISTORY:  Unknown to him.   REVIEW OF SYSTEMS:  As per history of present illness.  Also see patient  intake form documented in the chart.   PHYSICAL EXAMINATION:  GENERAL:  He is a well-developed male  in no acute  distress.  Blood pressure is 112/80, pulse 111, temperature 98.2, weight  is 156 pounds, he is 5 feet 7 inches tall, O2 saturation on room air  95%.  HEENT:  Pupils equal, round, and reactive to light and accommodation.  Extraocular muscles are intact.  Nares are patent without discharge.  Oropharynx is clear.  NECK:  Supple without JVD or lymphadenopathy.  There is no palpable  thyromegaly.  CHEST:  Reveals very decreased breath sounds in all lung fields but no  wheezes.  CARDIAC:  Reveals a mildly tachycardic but regular rhythm.  ABDOMEN:  Soft, nontender, with good bowel sounds.  GENITAL EXAM, RECTAL EXAM, BREAST EXAM:  Not done and not indicated.  LOWER EXTREMITIES:  Without edema.  Pulses are intact distally but  diminished.  NEUROLOGIC:  He is alert and oriented with no obvious motor deficits.   IMPRESSION:  1. Abnormal density noted on CT scan report from December 27, 2006,      which shows a density in the left upper lobe that is increasing in      size and has associated spiculation.  Unfortunately, this is not      available to me but sounds somewhat worrisome for malignancy.  I      have asked the patient to go by [facility] and get a disk and      bring to the office.  We have called them ahead of time to have it      prepared for him.  If he truly does have a lesion that is      suspicious for cancer and has grown in size, we typically treat      this as cancer until proven otherwise and have it resected.      However, the patient clearly has emphysema of unknown severity, and      if his lung function is poor enough we would like to try and      establish a diagnosis of cancer prior to possible resection.  I      would like to go ahead and get pulmonary function studies to get      some idea as to the severity of his emphysema and if significant      would follow through with a PET scan.  If the patient has adequate      pulmonary function for  resection, and the lesion is indeed      concerning for cancer, will go ahead and refer to thoracic surgery      for resection.  2. Emphysema with significant dyspnea on exertion.  I would like to      try the patient on a long-acting cholinergic as well as a long-      acting beta agonist and see if we can make some difference.  I have      stressed to him the importance of smoking cessation.   PLAN:  1. Obtain disk from [facility] to better look at the films.  2. Schedule for full pulmonary function studies.  3. Will start on Spiriva one puff q.a.m. and Symbicort 160/4.5 two      b.i.d.  He is to stop Flovent HFA and is to use his albuterol      inhaler only on a p.r.n. basis for rescue.  4. Stop smoking.  5. The patient will follow up after the above.     Barbaraann Share, MD,FCCP  Electronically Signed    [facility]/MedQ  DD: 01/06/2007  DT: 01/06/2007  Job #: [id number]   cc:   Aida Puffer

## 2010-08-12 NOTE — Discharge Summary (Signed)
[name], BERROCAL                ACCOUNT NO.:  [id number]   MEDICAL RECORD NO.:  [id number]          PATIENT TYPE:  INP   LOCATION:  6527                         FACILITY:  [facility]   PHYSICIAN:  Lezlie Octave, N.P.     DATE OF BIRTH:  1931/12/23   DATE OF ADMISSION:  08/09/2007  DATE OF DISCHARGE:  08/10/2007                               DISCHARGE SUMMARY   Mr. Oregel is a 77 year old male patient who came in for elective cath  by Dr. Nanetta Batty.  This was performed on Aug 09, 2007.  He had 80%  left main and 100% chronically occluded RCA with left to right  collaterals.  He did have a positive Myoview prior to his procedure.  Cardiovascular surgical consult was called.  He was seen by Dr. Tressie Stalker.  The patient had a long hospitalization back in February with  history of mycobacterium avium.  He had a VATS procedure by Dr. Edwyna Shell.  Dr. Freida Busman wanted him to get PFTs, which were performed prior to his  discharge.  He also had pre-Doppler procedures.  He had bilateral  moderate  heterogeneous diffuse plaque in bifurcation ICA.  He really  had no ICA stenosis.  His vertebral artery flow was antegrade, left was  abnormal wave forms.  His upper extremity palmar arches were within  normal limits and his right ABI waveforms demonstrated normal at rest  but his left ABI Doppler waveform demonstrated mild reduction of flow  with an ABI of 0.62.  An echo was performed also, which showed again an  EF 25-30%.  He had severe diffuse left ventricular hypokinesis.  He had  mildly reduced aortic valve leaflets with excursion.  He had mild MVR.  He had a small pericardial effusion circumferential to his heart.   Discharge medications are:  1. Lipitor 20 mg a day.  2. Advair 250/50 twice a day.  3. Aspirin 81 mg two per day.  4. Spiriva 80 mcg a day.  5. Furosemide 20 mg twice per day.  6. Potassium chloride 20 mEq once a day.  7. Metoprolol 12.5 mg twice a day.  8. Digoxin 0.125 mg once  a day.  9. Nitroglycerin 1/150 one at a time as needed for chest pain.  He did      have an episode of a decreased blood pressure at midnight and he      was given a bolus of fluid.  The following morning, his blood      pressures were okay and he was given his regular medications.      Blood pressure being 118/62, pulse was 63, and temperature was      96.8.  It was planned that he would follow up with Dr. Cornelius Moras on Aug 15, 2007, that will be Monday at 3:00 p.m. to discuss timing of his      surgery.  I do not have the results of the PFTs at this time.   DISCHARGE DIAGNOSES:  1. Positive Myoview with subsequent cardiac catheterization showing      left  main 80% and chronically occluded 100% RCA with left to right      collaterals.  2. Ischemic cardiomyopathy with an EF of 25-30% per echo done on Aug 10, 2007.  3. Hyperlipidemia.  4. Recent lobectomy for Mycobacterium avium complex.  5. Recent ventilator-dependent respiratory failure.  6. Chronic obstructive pulmonary disease.  7.  8. History of arteriosclerotic peripheral vascular disease.  9. History of congestive heart failure.  10.History of anemia.  11.Recent history of sepsis.  12.Recent history of acute renal failure.  13.Peripheral peripheral vascular disease.  14.Status post left femoral popliteal bypass for superficial femoral      artery occlusion.      Lezlie Octave, N.P.     BB/MEDQ  D:  08/10/2007  T:  08/11/2007  Job:  [id number]   cc:   Peyton Najjar A. Freida Busman, MD

## 2010-08-12 NOTE — Discharge Summary (Signed)
NAMEARNEL, [name]                ACCOUNT NO.:  [id number]   MEDICAL RECORD NO.:  [id number]          PATIENT TYPE:  INP   LOCATION:  3733                         FACILITY:  [facility]   PHYSICIAN:  Nanetta Batty, M.D.   DATE OF BIRTH:  1931-05-02   DATE OF ADMISSION:  01/31/2008  DATE OF DISCHARGE:  02/01/2008                               DISCHARGE SUMMARY   DISCHARGE DIAGNOSES:  1. Atherosclerotic peripheral vascular disease on this admission with      status post left superficial femoral artery percutaneous      transluminal angioplasty and stent secondary to claudication and      decreased ankle-brachial indices.  Previous attempts at same lesion      had failed.  This admission, I used a different wire system with a      successful procedure.  2. A prior medical history of left femoral-popliteal bypass grafting.  3. Atherosclerotic cardiovascular disease with history of coronary      bypass grafting x3 with left internal mammary artery to his left      anterior descending, saphenous vein graft to his obtuse marginal      artery and posterior descending artery in 2009.  4. Chronic obstructive pulmonary disease.  5. Severe left ventricular dysfunction/ischemic cardiomyopathy with an      ejection fraction of 25-35% with last Myoview on November 03, 2007,      showed an ejection fraction of 45% and no ischemia.  6. Hyperlipidemia.  7. History of congestive heart failure.  8. History of left upper lobe nodule which is noncancerous per old      chart.   LABORATORY:  Sodium 140, potassium 4.6, chloride 107, CO2 26, glucose  96, BUN 17, and creatinine 1.36.  Hemoglobin 11.6, hematocrit 34.9, WBCs  10.3, and platelets 283.   MEDICATIONS:  1. Lipitor 20 mg every day.  2. Advair 250/50 twice a day.  3. Aspirin 81 mg a day.  4. Spiriva every day.  5. Metoprolol 25 mg half twice a day.  6. Lanoxin 0.125 mg a day.  7. Lasix 20 mg a dat.  8. KCl 20 mEq every day.  9. ProAir p.r.n.  10.Altace 2.5 mg a day.  11.Oxygen as needed.  12.Plavix 75 mg every day was added.  13.He can take Pepcid 20 mg a day which he can buy over the counter at      the pharmacy if the stomach hurts.  He has had intolerance to full      dose aspirin in the past.  14.He is now on Plavix also.   OTHER DISCHARGE INSTRUCTIONS:  He should do no straining, lifting,  pushing, pulling, or exercise for a week.  He can drive in 2 days.  If  he has any problems with his groin, he will call us.  He has an  appointment for lower extremity Dopplers on February 21, 2008, at 10:30  and then to see Dr. Allyson Sabal on February 22, 2008 at 10:45.   HOSPITAL COURSE:  Mr. Karen Kinnard was admitted because of claudication  and elective re-attempt at  PTA and stent of his left SFA, which is known  occluded.  His only other option is to have another surgery, which he  would be high risk because of his heart disease.  Procedure was  performed by Dr. Allyson Sabal with a different catheter system, apparently with  the help of Dr. Jacinto Halim.  The procedure was successful.  He was seen by  Dr. Allyson Sabal on February 01, 2008, and he was stable and considered able to  be discharged home.      Lezlie Octave, N.P.      Nanetta Batty, M.D.  Electronically Signed    BB/MEDQ  D:  02/01/2008  T:  02/02/2008  Job:  [id number]   cc:   Barbaraann Share, MD,FCCP  Gabriel Earing, M.D.

## 2010-08-12 NOTE — Op Note (Signed)
[name], [name]                ACCOUNT NO.:  [id number]   MEDICAL RECORD NO.:  [id number]          PATIENT TYPE:  INP   LOCATION:  2899                         FACILITY:  [facility]   PHYSICIAN:  [name], M.D. DATE OF BIRTH:  04/18/1931   DATE OF PROCEDURE:  02/21/2007  DATE OF DISCHARGE:                               OPERATIVE REPORT   PREOPERATIVE DIAGNOSIS:  Left upper lobe mass, PET positive.   POSTOPERATIVE DIAGNOSIS:  Inflammatory mass left upper lobe, PET  positive.   OPERATION PERFORMED:  Left video assisted thoracoscopic surgery,  minithoracotomy, wedge left upper lobe lesion with node dissection.   SURGEON:  Dr. Patricia Nettle. Burney   FIRST ASSISTANT:  Jeanice Lim, RNFA   ANESTHESIA:  General anesthesia.   This long time 77 year old patient was a long time smoker, was found  have a left upper lobe lesion that was positive on PET.  Because of the  emphysematous nature of his left upper lobe.  The needle biopsy was  discouraged.  He is brought to the operating room for wedge resection of  this and possible left upper lobectomy.  He had a large lesion with a  small satellite lesion.  After general anesthesia, he was turned to the  left lateral thoracotomy position.  A dual-lumen tube was inserted.  The  left lung was deflated.  Two trocar sites were made in the anterior and  posterior midaxillary line at the seventh intercostal space, two trocars  were inserted.  Using a 0 degrees scope I could see there was a marked  amount of adhesions of particular the left upper lobe and the left lower  lobe to the chest wall.  Using electrocautery through one trocar site,  these were partially taken down but there was too much to take down this  way, so we proceeded with a small posterolateral thoracotomy over the  fifth intercostal space, partially dividing the latissimus, reflecting  the serratus anteriorly and opening the fifth intercostal space and  taking down the adhesions to this  area.  We then used sharp and blunt  dissection to take down the other adhesions, particularly the adhesions  to the left upper lobe.  The lesion was palpated in the posterior  segment of the left upper lobe.  We first divided the fissure with two  applications of the auto suture 60 blue stapler and then we resected the  first lesion with the auto suture 60 stapler and a second lesion which  was kind of superior to this with the auto suture 60 stapler, removing  about 40% of the left upper lobe.  Several areas where there was  bleeding were oversewn with 3-0 Vicryl.  One area on the left lower lobe  near the fissure had to be restapled with a no knife 45 with Singard  reinforcements.  We then dissected out the hilum, dissecting out a 5 and  a 10 R node and then a 11 R, a 10 L node and then 11 L node.  Two chest  tubes were brought in through the trocar sites and tied in place  with 0  silk.  A Marcaine block was done in the usual fashion.  A single On-Q  was inserted in the usual fashion.  Three drill holes were placed  through the sixth rib and then interpericostals passed around the  superior rib.  The frozen section came down as some type of  inflammatory process.  We sent some of the tissue for culture for fungus  and TB.  The pericostal were tied down and the chest was closed in  layers with #1 Vicryl in the muscle layer, 2-0 Vicryl in the  subcutaneous tissue and Dermabond for the skin.  The patient was turned  to the recovery room in stable condition.      [name], M.D.  Electronically Signed     DPB/MEDQ  D:  02/21/2007  T:  02/21/2007  Job:  [id number]   cc:   Barbaraann Share, MD,FCCP

## 2010-08-12 NOTE — Assessment & Plan Note (Signed)
OFFICE VISIT   [name], [name]  DOB:  Jul 28, 1931                                        March 17, 2007  CHART #:  [id number]   HISTORY OF PRESENT ILLNESS:  [name] is status post left video  assisted thoracoscopic surgery with mini thoracotomy and wedge resection  in the left upper lobe as well as lymph node dissection on February 21, 2007. He is seen in the office on today's date for surgical followup.  Currently he describes intermittent shortness of breath, sometimes  fairly significant, but at other times unremarkable. He obtained a chest  x-ray which reveals and increasing size of the pneumothorax on the left  side. His chest x-ray continues to show significant bilateral  subcutaneous air.   PHYSICAL EXAMINATION:  VITAL SIGNS: Blood pressure 107/72, heart rate  118, respirations 24, oxygen saturation 88% on room air.  PULMONARY EXAMINATION: Reveals marked subcutaneous air throughout the  thorax into the neck.  LUNGS: Sounds are fairly clear, but somewhat distant and also are  diminished in the bases.  CARDIAC EXAMINATION: Regular rate and rhythm. Tachycardic. Incision is  healing well without evidence of infection.  EXTREMITIES: No significant edema.   ASSESSMENT:  [name] appears to have a slow ongoing leak. He was  evaluated by Dr. Edwyna Shell as well as his chest x-ray was reviewed. It is  Dr. Scheryl Darter opinion that the patient should  be seen in one week with a repeat chest x-ray. The patient is also  informed to be seen earlier if his symptoms increase.   Rowe Clack, P.A.-C.   Sherryll Burger  D:  03/17/2007  T:  03/18/2007  Job:  [id number]   cc:   [name], M.D.  Barbaraann Share, MD,FCCP

## 2010-08-12 NOTE — Discharge Summary (Signed)
[name], [name]                ACCOUNT NO.:  [id number]   MEDICAL RECORD NO.:  [id number]          PATIENT TYPE:  INP   LOCATION:  2040                         FACILITY:  [facility]   PHYSICIAN:  [name], M.D. DATE OF BIRTH:  1931-08-19   DATE OF ADMISSION:  02/21/2007  DATE OF DISCHARGE:  03/08/2007                               DISCHARGE SUMMARY   PRIMARY ADMITTING DIAGNOSIS:  Left upper lobe lung mass.   ADDITIONAL/DISCHARGE DIAGNOSES:  1. Acute and chronic inflammation and bronchiectasis with focal      necrosis and abscess of left upper lobe.  2. Chronic obstructive pulmonary disease.  3. History of myocardial infarction in 1986.  4. History of tobacco abuse.  5. History of significant alcohol use.  6. Postoperative encephalopathy and delirium, multifactorial.  7. Postoperative respiratory failure requiring reintubation.  8. Tissue culture positive for Klebsiella pneumoniae.   PROCEDURES PERFORMED:  1. Left VATS.  2. Left mini thoracotomy with left upper lobe wedge resection and      lymph node dissection.   HISTORY:  The patient is a 77 year old male who was recently found on  chest x-ray to have a left upper lobe lung mass.  He underwent a CT scan  and a subsequent PET scan which was positive, with uptake in the left  upper lobe and questionable uptake in the mediastinal nodes.  He has a  history of smoking one and one and a half packs of cigarettes per day  which is ongoing.  He saw Dr. Edwyna Shell in the office and his films were  reviewed.  Dr. Edwyna Shell felt that his best course of action would be to  proceed with surgical resection at this time.  He explained the risks,  benefits and alternatives of the procedure to the patient and he agreed  to proceed.   HOSPITAL COURSE:  He was admitted to [facility] on February 21, 2007, and underwent a left upper lobe wedge resection x2.  Intraoperative frozen sections and final pathology were negative for  malignancy.  Biopsies showed acute and chronic inflammation and  bronchiectasis with organizing pneumonia, dense fibrosis and scarring  and focal necrosis and abscess.  The patient tolerated the procedure  well and was transferred to Unit 3300 in stable condition.  He is  followed postoperatively by Dr. Shelle Iron for management of his COPD and  emphysema.  Initially, postoperatively, he progressed as expected.  On  postop day #2 he developed a significant leukocytosis with white blood  count up to 21,000.  He was started empirically on Fortaz and was  treated with aggressive pulmonary toilet measures for presumed  bronchitis.  Over the course of the next 48 hours, he became weaker and  developed mental status changes and significant tachycardia with heart  rates up into the 130s and 140s, necessitating transfer to the SICU for  further observation.  His confusion worsened and he developed increased  work of breathing and ultimately required reintubation following his  transfer to the unit.  He remained stable on the vent and in the  interim, his intraoperative  cultures came back positive for Klebsiella  which was sensitive to Levaquin.  His antibiotic coverage was changed  and he was started on steroid taper.  He was extubated in the evening on  February 27, 2007, however, he became combative and agitated and was  reintubated later that evening.  It was unclear the source of his mental  status changes, however, because of his history of heavy alcohol  consumption, he was placed on the DT protocol.  A neurology consult was  obtained and the patient was seen by Dr. Thad Ranger.  He felt, based on  his physical exam, that this was a Hospital induced encephalopathy which  was multifactorial and recommended continuing with his current plan of  care unless other acute changes occurred.  He was able to be extubated  on March 01, 2007.  Following his extubation, his respiratory status  remained  stable.  He was covered with sliding scale insulin while on the  steroid taper.  He did continue to have tachycardia with heart rate  below 100s and was started on a beta blocker.  Once he was extubated, he  was started on clear liquids.  A swallow study was performed at the  bedside and he was felt to be at high risk for aspiration.  He was  observed closely and his mental status rapidly began to improve.  He was  continued on IV hydration and clear liquids and once his mental status  had improved to baseline, he was restarted on his regular diet.  On  March 04, 2007, he was transferred to the step-down unit and was  started on physical therapy and ambulation.  By March 07, 2007, he  was back to his mental status baseline, was significantly improved from  a respiratory standpoint and was otherwise doing well.  At that time, he  was able to be transferred to the floor.  Currently is incisions are all  healing well.  He is afebrile and all vital signs are stable.  His white  blood count has stabilized at 15.5 and he has completed a full course of  Levaquin.  The remainder of his labs show hemoglobin 12.8, hematocrit  37.5, platelets 401.  Sodium 137, potassium 3.7, BUN 17, creatinine  0.91.  His most recent chest x-ray has been stable with no pneumothorax.  He has been transferred to the floor.  He will be evaluated on morning  rounds on March 08, 2007.  At that time, if the acute changes have a  current and he has continued to remain stable, he will hopefully be  ready for discharge home.   DISCHARGE MEDICATIONS:  1. Advair 250/50 one puff b.i.d.  2. Folic acid 1 mg daily.  3. Tylox one to two q.4h. p.r.n. for pain.  4. Reglan 10 mg t.i.d. with meals x3 days.  5. Meloxicam 15 mg daily.  6. Simvastatin 10 mg daily.  7. Pletal 100 mg b.i.d.  8. Albuterol 2 puffs daily p.r.n.  9. Spiriva daily.  10.Prednisone taper as follows: 20 mg b.i.d. x2 days, then 10 mg      b.i.d. x2  days, then 10 mg daily x1 week, then discontinue.   DISCHARGE INSTRUCTIONS:  He is asked to refrain from driving, heavy  lifting or strenuous activity.  He may continue ambulating daily and  using his incentive spirometer.  He may shower daily and clean his  incisions with soap and water.  He will continue his same preoperative  diet.  DISCHARGE FOLLOWUP:  He will need to make an appointment see Dr. Shelle Iron  in two weeks for recheck of his respiratory status.  He will see Dr.  Edwyna Shell in the office and one week with a chest x-ray.  In the interim,  if he experiences problems or has questions, he is to contact our office  immediately.  Home health physical therapy and a home health nurse have  been arranged to assist the patient after discharge.      Coral Ceo, P.A.      [name], M.D.  Electronically Signed    GC/MEDQ  D:  03/07/2007  T:  03/08/2007  Job:  [id number]   cc:   Barbaraann Share, MD,FCCP  Marolyn Hammock. Thad Ranger, M.D.  [facility] office

## 2010-08-12 NOTE — Consult Note (Signed)
NAMECHARLY, [name]                ACCOUNT NO.:  [id number]   MEDICAL RECORD NO.:  [id number]          PATIENT TYPE:  OIB   LOCATION:  6527                         FACILITY:  [facility]   PHYSICIAN:  Salvatore Decent. Cornelius Moras, M.D. DATE OF BIRTH:  [birth year]   DATE OF CONSULTATION:  DATE OF DISCHARGE:                                 CONSULTATION   REQUESTING PHYSICIAN:  Nanetta Batty, MD.   REASON FOR CONSULTATION:  Left main disease.   HISTORY OF PRESENT ILLNESS:  [name] is a 77 year old [place]  gentleman with known history of coronary artery disease, peripheral  vascular disease, hypertension, hyperlipidemia, and congestive heart  failure.  The patient apparently suffered a myocardial infarction more  than 20 years ago and was treated medically at that time.  More  recently, the patient was noted to have a lung mass for which he  underwent left VATS and mini-thoracotomy for wedge resection in November  2008 by Dr. Edwyna Shell.  This mass turned out to be inflammatory, and  cultures ultimately grew Mycobacterium avium species.  His postoperative  recovery has been prolonged and complicated.  Initially, he had  recurrent pneumothoraces requiring repeat chest tube replacement and  hospitalization.  He then developed some type of infection and was  treated for antibiotics for prolonged period of time.  Subsequent to  this he developed severe C. difficile colitis which progressed to the  point of severe sepsis with respiratory failure and acute renal failure  for which he was initially hospitalized on May 01, 2007.  He was on  a ventilator for over a week and treated briefly with renal replacement  therapy for acute renal failure.  All of this resolved with medical  treatment, and he ultimately was discharged home, although his recovery  has been quite slow.  He initially was oxygen dependent and severely  short of breath.  This has slowly improved.  Of note, during his  hospitalization in  February, he was noted to have problems with  congestive heart failure, and an echocardiogram was performed revealing  ejection fraction 20%-25%.  This was considerably worse than an  echocardiogram performed several years previously.  He apparently did  not have an echocardiogram or stress test immediately prior to his chest  surgery.  He also had some transient paroxysmal atrial fibrillation  during his acute illness.  All these issues improved or resolved over  time.  Following his most recent hospital discharge, [name]'s  progress has been slow.  He does state that over the last 2-3 weeks his  breathing has improved.  He still gets short of breath with ambulation,  but he states that he can walk much further than he had been previously.  He states that at the time of hospital discharge, he could barely walk  across the room without being short of breath.  Now, he can walk  considerably further period of time and he states that he can even go up  a flight of stairs if he goes slowly.  He states that he no longer seems  to need oxygen all of  the time, although he still uses it for sleeping  at night.   [name] was seen in follow up by Dr. Shelle Iron in April, and concern  was raised regarding his problems with congestive heart failure and  continued shortness of breath.  He was referred back to Dr. Nanetta Batty.  A stress Myoview exam was performed in late April showing what  was felt to be subtle anterior wall ischemia with inferior scar and mild  peri-infarct ischemia.  Medications were adjusted, and [name]  continued to improve clinically.  He was ultimately brought in for  elective cardiac catheterization today.  He was found to have left main  disease with 3-vessel coronary artery disease and moderate-to-severe  left ventricular dysfunction.  Cardiothoracic surgical consultation was  requested.   REVIEW OF SYSTEMS:  GENERAL:  [name] has been slowly getting  better  since this hospitalization, and it has only been over the last 2-3 weeks  that he has really started to feel good.  His primary complaint is  shortness of breath with activity.  His appetite is stable.  He is not  sure if he has been gaining or losing weight, but he states he is  hearing okay.  CARDIAC:  The patient specifically denies any chest pain,  chest tightness, or chest pressure either with activity or rest.  His  only complaint is exertional shortness of breath which he states he has  had for years, although it was much worse after his recent series of  hospitalizations.  He states it is much better over the last few weeks.  Prior to this, he does report having problems with resting shortness of  breath, but he states that this is no longer the case.  In the past, he  has had paroxysmal nocturnal dyspnea, but not recently.  He denies any  tachy palpitations or dizzy spells.  He has not had syncopal episodes.  He has not had lower extremity edema.  RESPIRATORY:  Notable for  exertional shortness of breath.  The patient has been able to continue  to stay away from smoking since last November.  He denies productive  cough, hemoptysis, or wheezing.  GASTROINTESTINAL:  Negative.  MUSCULOSKELETAL:  Negative.  PERIPHERAL VASCULAR:  Notable for severe  left calf claudication which bothers him.  As he has been improving from  the standpoint of his breathing, he has been walking more, and he now  gets claudication in his left calf.  He does not have rest pain.  NEUROLOGIC:  Negative.  PSYCHIATRIC:  Negative.   PAST MEDICAL HISTORY:  1. Coronary artery disease.  2. Congestive heart failure.  3. Chronic obstructive pulmonary disease.  4. Hypertension.  5. Hyperlipidemia.  6. Mycobacterium avium into cellularity, status post left upper lobe      wedge resection, November 2007.  7. Peripheral vascular disease, s/p left femoral-popliteal bypass for      SFA occlusion.   FAMILY  HISTORY:  Noncontributory.   SOCIAL HISTORY:  The patient lives alone and only recently retired last  spring at the age of 60 after having worked in the [employer] for  many years.  The patient has a long-standing history of heavy tobacco  abuse, although he quit smoking in November 2007.  He denies excessive  alcohol consumption.   CURRENT MEDICATIONS:  Lipitor, Pletal, Advair Diskus, Spiriva, aspirin,  and albuterol as needed.   DRUG ALLERGIES:  TETANUS TOXOID.   PHYSICAL EXAM:  The patient  is a thin male who appears his stated age  and is in no acute distress.  HEENT exam is unrevealing.  There is no  jugular venous distention.  There are no carotid bruits.  Auscultation  of the chest demonstrates few inspiratory crackles.  No wheezes or  rhonchi noted.  Breath sounds are symmetrical.  Cardiovascular exam is  notable for regular rate and rhythm.  No murmurs, rubs, or gallops  noted.  The abdomen is soft, nondistended, and nontender.  Bowel sounds  are present.  The extremities are warm and adequately perfused.  There  is a well-healed scar in the left thigh from previous femoropopliteal  bypass.  Distal pulses are not palpable in either lower leg at the  ankle.  There are changes in some venous insufficiency in both lower  legs.  There is no lower extremity edema.  The skin is frail and thin  throughout, but there are no open skin lesions or ulcerations.  Rectal  and GU exams are both deferred.   DIAGNOSTIC TESTS:  Cardiac catheterization performed by Dr. Gery Pray today  is reviewed.  This demonstrates 80% left main coronary artery stenosis  with 100% chronic occlusion of the right coronary artery.  There is  otherwise no significant coronary artery disease.  There is moderate-to-  severe left ventricular dysfunction with ejection fraction estimated 25%-  30%.  There is inferior wall akinesis.  There is some calcification of  the ascending and transverse thoracic aorta.    IMPRESSION:  Left main disease with 3-vessel coronary artery disease and  moderate-to-severe left ventricular dysfunction.  Mr. Butch has long-  standing symptoms of exertional shortness of breath.  He developed  congestive heart failure during his most recent hospitalization and  acute illness.  He has slowly recovered from this and he has actually  been improving recently.  He states that he feels better over the last 2-  3 weeks than he has been more than 6 months since his surgery last  November.  Based upon his coronary anatomy, I do not feel there is much  question that he would probably benefit from his coronary artery bypass  grafting.  His underlying pulmonary function is not clear at this point  in time, and is not clear to me whether or not now is the best time to  proceed with surgery.  Obviously, there is some risk of cardiac event  during the interim period time, but on the other hand Mr. Botting seems  to be only now slowly recovering to the point where he has been feeling  good.   PLAN:  We will obtain pulmonary function tests and a follow up  echocardiogram as well as basic lab work.  We will discuss with Dr.  Shelle Iron and Dr. Allyson Sabal plans for possible surgery in the near future.      Salvatore Decent. Cornelius Moras, M.D.  Electronically Signed     CHO/MEDQ  D:  08/09/2007  T:  08/10/2007  Job:  [id number]   cc:   Nanetta Batty, M.D.  Barbaraann Share, MD,FCCP  Thora Lance, M.D.

## 2010-08-12 NOTE — Discharge Summary (Signed)
[name], [name]                ACCOUNT NO.:  [id number]   MEDICAL RECORD NO.:  [id number]          PATIENT TYPE:  INP   LOCATION:  2017                         FACILITY:  [facility]   PHYSICIAN:  Salvatore Decent. Cornelius Moras, M.D. DATE OF BIRTH:  1931-05-19   DATE OF ADMISSION:  08/17/2007  DATE OF DISCHARGE:                               DISCHARGE SUMMARY   ADDENDUM   Over the next 2 days, the patient remained stable.  He remained in  normal sinus rhythm.  The patient remained on 2 liters nasal cannula,  maintaining sats greater than 90%. All incisions were clean, dry, and  intact, and healing well.  A chest x-ray done on Aug 26, 2007, was  stable with no significant atelectasis or pneumothorax noted.  The  patient was felt to be ready for transfer to [facility] in a.m. on  August 29, 2007.  For details of the patient's followup appointments and  discharge instructions, please see dictated discharge summary.   DISCHARGE MEDICATIONS:  1. Lipitor 20 mg at night.  2. Advair Diskus 250/50 one puff b.i.d.  3. Lanoxin 0.125 mg daily.  4. Spiriva 18 mcg inhale daily.  5. Enteric-coated aspirin 325 mg daily.  6. Ramipril 2.5 mg daily.  7. Lopressor 12.5 mg b.i.d.  8. Ultram 50 mg 1-2 tab q.4-6 hours p.r.n.  9. ProAir HFA 2 puffs q.4-6 hours p.r.n.  10.Robitussin DM 10 mL p.o. q.6-8 hours p.r.n.  11.He will require 2 liters nasal cannula and maintain sats greater      than 90%.      Theda Belfast, PA      Salvatore Decent. Cornelius Moras, M.D.  Electronically Signed    KMD/MEDQ  D:  08/28/2007  T:  08/28/2007  Job:  [id number]   cc:   Nanetta Batty, M.D.

## 2010-08-12 NOTE — Discharge Summary (Signed)
NAMEHADEN, [name]                ACCOUNT NO.:  [id number]   MEDICAL RECORD NO.:  [id number]          PATIENT TYPE:  INP   LOCATION:  2034                         FACILITY:  [facility]   PHYSICIAN:  [name], M.D. DATE OF BIRTH:  08/09/1931   DATE OF ADMISSION:  03/10/2007  DATE OF DISCHARGE:  03/14/2007                               DISCHARGE SUMMARY   PRIMARY ADMITTING DIAGNOSES:  1. Anterior left pneumothorax.  2. Extensive subcutaneous emphysema.   ADDITIONAL/DISCHARGE DIAGNOSES:  1. Anterior left pneumothorax.  2. Extensive subcutaneous emphysema.  3. 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  4. Chronic obstructive pulmonary disease.  5. History of tobacco abuse.  6. History of alcohol use.   PROCEDURE PERFORMED:  Insertion of anterior chest tube.   HISTORY:  The patient is a 77 year old male who was recently discharged  from the hospital status post a left VATS.  He had undergone a left  upper lobe wedge resection which was positive for Mycobacterium avium  and negative for malignancy.  His postoperative course was complicated  by mental status changes and respiratory failure which required  reintubation.  He ultimately was discharged home in good condition.  However, after his discharge home, he developed acute onset of swelling  in his left chest and face.  He was brought to the emergency department  at [facility] for further evaluation and was noted to have  significant subcutaneous emphysema on physical exam, and chest x-ray  showed an anterior pneumothorax with subcutaneous air.  Because of this,  Dr. Edwyna Shell was consulted and saw the patient in the emergency department  for chest tube placement and admission for chest tube management.   HOSPITAL COURSE:  The patient underwent placement of an anterior left  chest tube by Dr. Edwyna Shell.  He was admitted subsequently for chest tube  management.  Following  placement of his chest tube, his subcutaneous  emphysema began to improve.  Over the course of the next several days,  his pneumothorax and his air leak both resolved.  On March 13, 2007,  his chest tube was discontinued.  A followup chest x-ray showed  significant improvement with only a minimal, less than 5%, apical  pneumothorax.  Otherwise, he has remained stable during this admission.  He has continued to work with physical therapy and is improving from a  deconditioning standpoint.  He has been afebrile, and his vital signs  have been stable.  His other surgical incisions are healing well.  He is  tolerating a regular diet and is having normal bowel and bladder  function.  He was noted to have a mild elevation of his white blood cell  count from the emergency department.  This was felt to be secondary to a  steroid taper.  He has been seen and evaluated on morning rounds by Dr.  Edwyna Shell on March 14, 2007, and it is felt that at this time he may be  discharged home.   DISCHARGE MEDICATIONS:  1. Prednisone 10 mg daily to complete a taper.  2. Tylox 1-2 q.4-6h. p.r.n. for pain.  3. Advair 250/50 1 puff b.i.d.  4. Folic acid 1 mg daily.  5. Reglan 10 mg t.i.d. with meals.  6. Meloxicam 15 mg daily.  7. Simvastatin 10 mg daily.  8. Pletal 100 mg b.i.d.  9. Albuterol 2 puffs p.r.n.  10.Spiriva daily.   DISCHARGE INSTRUCTIONS:  He is asked to refrain from driving, heavy  lifting, or strenuous activity.  He may continue ambulating daily and  using his incentive spirometer.  He may shower daily and clean his  incisions with soap and water.  He will continue the same preoperative  diet.   DISCHARGE FOLLOWUP:  He will see Dr. Edwyna Shell back in the office on  Thursday, March 17, 2007 with a chest x-ray from [facility].  If he experiences any problems or has questions in the interim, he is  asked to contact our office immediately.  He will also have his home  health nurse  and physical therapy reinstated following discharge.      Coral Ceo, P.A.      [name], M.D.  Electronically Signed    GC/MEDQ  D:  03/14/2007  T:  03/14/2007  Job:  [id number]   cc:   [facility] Office

## 2010-08-12 NOTE — Cardiovascular Report (Signed)
[name], [name]                ACCOUNT NO.:  [id number]   MEDICAL RECORD NO.:  [id number]          PATIENT TYPE:  OIB   LOCATION:  3703                         FACILITY:  [facility]   PHYSICIAN:  Nanetta Batty, M.D.   DATE OF BIRTH:  1931/12/19   DATE OF PROCEDURE:  DATE OF DISCHARGE:                            CARDIAC CATHETERIZATION   Mr. Littles is a 77 year old gentleman now 4 months status post coronary  artery bypass grafting x3 on Aug 17, 2007.  He had a LIMA to his LAD,  vein graft to an OM and a PDA.  He has moderately severe LV dysfunction  with EF in the 25% range.  He also has history of PVOD status post fem-  pop bypass grafting on the left by Dr. Marcy Panning in 2004.  He has  discontinued tobacco abuse with COPD.  He does complain of left lower  extremity claudication.  Dopplers in our office performed August 2009  revealed an occluded left fem-pop bypass graft.  He presents now for  angiography and potential intervention.   DESCRIPTION OF PROCEDURE:  The patient was brought to the Second Floor  [facility] in a postabsorptive state.  He was  premedicated with p.o. Valium, IV Versed and fentanyl.  His right groin  was prepped and shaved in usual sterile fashion.  Xylocaine 1% was used  for local anesthesia.  A 5 upgraded to a 7-French sheath was inserted  into the right femoral artery using standard Seldinger technique.  A 5-  J[place] __________ catheter was used for abdominal aortography with  bifemoral runoff using bolus-chase digital subtraction with step-table  technique.  Visipaque dye was used for the entirety of the case.  Retrograde aortic pressures were monitored throughout the case.   ANGIOGRAPHIC RESULTS:  1. Abdominal aorta;      a.     Renal arteries - normal.      b.     Infrarenal abdominal aorta - normal.  2. Left lower extremity;      a.     Occluded left fem-pop bypass graft.      b.     Short segment mid left SFA occlusion with  reconstitution in       the adductor canal by profunda femoris and bridging collaterals.       He had two-vessel runoff with occluded anterior tibialis.  3. Right lower extremity;      a.     Normal with one-vessel runoff via the peroneal.   DESCRIPTION OF PROCEDURE:  The patient received a total of 4000 units of  heparin intravenously.  Contralateral access was obtained with a 5-  Jamaica crossover catheter, 0.35 Wholey wire.  The proximal cap was  punctured with an angled Glidewire and a 0.35 Quick-Cross catheter.  I  was able then to get down to the distal cap.  A 2.5 x 4 Fox SV balloon  was used to angioplasty the proximal cap for repeat access.  I attempted  to use the left re-entry device, however, I was never at the level of  the  cath and therefore the procedure was aborted, in fact the patient  had 300 mL of contrast and he has LV dysfunction.  The sheath was  withdrawn across the bifurcation.  ACT  was measured and the sheath was removed.  The patient left the lab in  stable condition.  The patient will be gently hydrated, discharged home  later today as an outpatient and will see me back in the office  approximately 1-2 weeks for followup.      Nanetta Batty, M.D.  Electronically Signed     JB/MEDQ  D:  12/27/2007  T:  12/27/2007  Job:  [id number]   cc:   Suite Second [facility]  [facility]  Aida Puffer

## 2010-08-12 NOTE — Discharge Summary (Signed)
NAMEWARRICK, [name]                ACCOUNT NO.:  [id number]   MEDICAL RECORD NO.:  [id number]          PATIENT TYPE:  INP   LOCATION:  2017                         FACILITY:  [facility]   PHYSICIAN:  Salvatore Decent. Cornelius Moras, M.D. DATE OF BIRTH:  Oct 21, 1931   DATE OF ADMISSION:  08/17/2007  DATE OF DISCHARGE:  08/26/2007                               DISCHARGE SUMMARY   ADMITTING DIAGNOSES:  1. History of myocardial infarction.  2. Multivessel coronary artery disease.  3. Ischemic cardiomyopathy (with an ejection fraction of 25%-30%).  4. History of hypertension.  5. History of hyperlipidemia.  6. History of congestive heart failure.  7. History of peripheral vascular disease.  8. History of left upper lobe lung nodule (noncancerous).   DISCHARGE DIAGNOSES:  1. History of myocardial infarction.  2. Multivessel coronary artery disease.  3. Ischemic cardiomyopathy (with an ejection fraction of 25%-30%).  4. History of hypertension.  5. History of hyperlipidemia.  6. History of congestive heart failure.  7. History of peripheral vascular disease.  8. History of left upper lobe lung nodule (noncancerous).   PROCEDURE:  Coronary artery bypass grafting x3. (left internal mammary  artery to left anterior descending, saphenous vein graft to obtuse  marginal, saphenous vein graft to RPL with EVH of the right lower  extremity done by Dr. Cornelius Moras on Aug 17, 2007.   HOSPITAL COURSE:  [name] is a 77 year old Caucasian male with a  remote MI, history of CAD, ischemic cardiomyopathy with a depressed EF  (history of hypertension, history of hyperlipidemia, history of CHF, and  history of peripheral vascular disease) who had a thoracotomy for wedge  resection and lymph node biopsies in November 2008.  Pathology revealed  no neoplasm in the left upper lobe or lymph nodes.  The patient had a  somewhat complicated postoperative course after this (sepsis, CHF  complicated by acute respiratory and renal  failure).  Eventually he did  recover.  He had an echocardiogram done that showed global left  ventricular dysfunction with an EF of 25%.  A cardiac catheterization  was then performed and was found to have multivessel coronary artery  disease (included left main).   Postoperatively, the patient required a right chest tube for a right  pneumothorax.  He was weaned off the drips as tolerated.  He did have  hypotension postoperatively as well as low urine output (and an elevated  creatinine).  He was begun on low-dose dopamine, as his blood pressure  improved.  Because of volume overload, he was placed on a Lasix drip.  This was discontinued on Aug 20, 2007.  His chest tubes still remained  for several days postoperatively due to a persistent air leak.  His  right pneumothorax was secondary to a ruptured bleb.  His left chest  tube was then removed on Aug 22, 2007, and right chest tube remained for  a couple of more days.  This was then removed as well.  Followup chest x-  ray revealed a 5% right apical pneumothorax.  This remained stable and  still present on Aug 25, 2007.  Because  of volume overload, he continued  to be diuresed.  The patient continued to improve by Aug 26, 2007.  He  was afebrile.  Vital signs were stable.  His O2 sat is 93%-98% on 3 L of  nasal cannula (COPD).  He will most likely be discharged home with home  O2.   PHYSICAL EXAMINATION:  CARDIOVASCULAR:  Regular rate and rhythm.  PULMONARY:  Clear to auscultation bilaterally.  No wheezes or rhonchi.  EXTREMITIES:  No real edema.  Incisions were clean, dry, and continued  to heal.  No signs of infection.   The patient is going be discharged to an SNF.   LABORATORY DATA:  Latest laboratory studies:  BMET done on Aug 26, 2007,  showed potassium to be 3.8, BUN and creatinine 14 and 1.01 respectively.  CBC was done Aug 24, 2007, white count 8,800, H&H was 10.2 and 30.2  respectively, and platelets 455,000.   Last  chest x-ray was done on Aug 26, 2006, which showed a stable tiny  right apical pneumothorax, small bilateral pleural effusion, and basilar  atelectasis.   DISCHARGE INSTRUCTIONS:  The patient is to remain on a low-fat, low-salt  diet.  He is not to drive or lift more than 10 pounds until instructed  otherwise.  He is to continue using his incentive spirometer.  He is to  walk daily, and increase frequency and duration as tolerated.  On  followup appointments, he needs to see Dr. Allyson Sabal in 2 weeks and Dr. Cornelius Moras  on September 19, 2007.  Prior to this appointment, a chest x-ray will be  obtained.  Epicardial pacing wires have already been removed.   DISCHARGE MEDICATIONS:  Include the following,  1. Lipitor 20 mg p.o. nightly.  2. Advair Diskus 250/50 one puff twice daily.  3. Lanoxin 0.125 mg p.o. daily.  4. Spiriva 18 mcg inhaled daily.  5. ECASA 325 mg p.o. daily.  6. Ramipril 2.5 mg p.o. daily.  7. Lopressor 25 mg p.o. twice daily.  8. Ultram 50 mg 1-2 tablets p.o. q.4-6 h.  p.r.n. pain.  9. ProAir HFA 2 puffs q.4-6 h. p.r.n.      Doree Fudge, PA      [place] H. Cornelius Moras, M.D.  Electronically Signed    DZ/MEDQ  D:  08/26/2007  T:  08/27/2007  Job:  [id number]   cc:   Nanetta Batty, M.D.

## 2010-08-12 NOTE — Assessment & Plan Note (Signed)
OFFICE VISIT   [name], [name]  DOB:  1931-11-22                                        Aug 15, 2007  CHART #:  [id number]   HISTORY OF PRESENT ILLNESS:  Mr. Belva Chimes returns for followup related to  newly discovered left main disease.  He was originally seen in  consultation at the time of this heart catheterization last week.  Since  then, he has done well.  He specifically denies any problems with chest  pain or shortness of breath.  He is eager to go ahead and proceed with  surgery, and he has discussed it further with Dr. Allyson Sabal after he had  consultation with me last week.  The remainder of his review of systems  is unrevealing.   Pulmonary function tests performed May 13 are reviewed and notable for a  baseline FEV-1 of 1.47 liters or 63% predicted.  This did not change  with bronchodilator therapy.  The FEF 25-75 was 0.44 liters or 28%  predicted.  Of note, these results are actually better than pulmonary  function tests that were performed in October 2008.  The patient's  baseline diffusion capacity is 31% predicted.   A 2-D echocardiogram performed May 13 is reviewed.  This demonstrates  moderate to severe left ventricular dysfunction with ejection fraction  estimated 25-30%.  Left ventricular size was normal.  There was global  hypokinesis.  There was mild mitral regurgitation and mild tricuspid  regurgitation.  No other abnormalities were noted.   I discussed the indications, risks, and potential benefits of elective  coronary artery bypass grafting at length with Mr.  Dupras again today.  He understands the rationale for proceeding with surgery at this  juncture due to the severity of his left main disease and three-vessel  coronary artery disease.  Although he would prefer to hold off until he  had recovered further from the surgery and other problems he had  beginning last fall, he understands that we feel concerned that with  continued  medical therapy he would be at significant risk for further  problems with congestive heart failure and/or fatal myocardial  infarction.  He understands and accepts all associated risks of surgery  including, but not limited to risk of death, stroke, myocardial  infarction, congestive heart failure, respiratory failure, pneumonia,  bleeding requiring blood transfusion, arrhythmia, infection, recurrent  coronary artery disease and recurrent congestive heart failure.  All of  his questions have been addressed.  We tentatively plan to proceed with  surgery on Wednesday. May 20.   Salvatore Decent. Cornelius Moras, M.D.  Electronically Signed   CHO/MEDQ  D:  08/15/2007  T:  08/15/2007  Job:  [id number]   cc:   Nanetta Batty, M.D.  Barbaraann Share, MD,FCCP  Gabriel Earing, M.D.  [name], M.D.

## 2010-08-12 NOTE — H&P (Signed)
[name], [name]                ACCOUNT NO.:  [id number]   MEDICAL RECORD NO.:  [id number]          PATIENT TYPE:  INP   LOCATION:  2034                         FACILITY:  [facility]   PHYSICIAN:  [name], M.D. DATE OF BIRTH:  Sep 20, 1931   DATE OF ADMISSION:  03/10/2007  DATE OF DISCHARGE:                              HISTORY & PHYSICAL   This is a readmit note.   CHIEF COMPLAINT:  Swelling in face.   HISTORY OF PRESENT ILLNESS:  This 77 year old patient had a left VATS  with wedge resection and postoperatively required prolonged ventilation  and had confusion.  After this had cleared and his chest tubes had been  discontinued he went home on Monday and then today started noticing  swelling of his left chest and face.  He came to the emergency room.  A  chest x-ray showed an anterior pneumothorax with subcutaneous emphysema.  A left chest tube was inserted without difficulty and chest x-ray showed  this to be in good position.  His cultures from his wedge resection  showed up Mycobacterium avium.  He initially had some problems with  balance but this has improved since he has gone home and his weakness  has improved.   PAST MEDICAL HISTORY:  Significant for chronic obstructive pulmonary  disease.   MEDICATIONS:  1. Advair Diskus 250/50 two twice a day.  2. Reglan 10 mg three times a day.  3. Ultram for pain.  4. Prednisone 10 mg twice a day - he was on a tapering dose.   FAMILY HISTORY:  Noncontributory.   SOCIAL HISTORY:  He is widowed, lives alone.  Came in today with his  neighbor.  He has stopped smoking but smoked up until his admission to  the hospital.  Apparently drinks alcohol regularly.   REVIEW OF SYSTEMS:  Noncontributory except for above in history of  present illness and past medical history.   PHYSICAL EXAMINATION:  His pulse is 100, blood pressure is 130/77,  respirations are 22, saturations are 94%.  HEAD, EYES, EARS, AND THROAT:  Unremarkable  except for marked  subcutaneous emphysema and swelling.  NECK:  Supple without thyromegaly.  There is no supraclavicular or  axillary adenopathy but there is subcutaneous air.  CHEST:  Decreased breath sounds in the left upper lobe and subcutaneous  air.  HEART:  Regular sinus rhythm, no murmur.  ABDOMEN:  Soft.  There is no hepatosplenomegaly.  EXTREMITIES:  Pulses are 2+.  There is no clubbing or edema.  NEUROLOGIC:  He is alert and oriented x3.  Sensory and motor are intact,  cranial nerves intact.   IMPRESSION:  Pneumothorax with subcutaneous air status post wedge of  left upper lobe for Mycobacterium avium and chronic obstructive  pulmonary disease and tobacco abuse.      [name], M.D.  Electronically Signed     DPB/MEDQ  D:  03/10/2007  T:  03/11/2007  Job:  [id number]

## 2010-08-12 NOTE — Discharge Summary (Signed)
[name], [name]                ACCOUNT NO.:  [id number]   MEDICAL RECORD NO.:  [id number]          PATIENT TYPE:  INP   LOCATION:  3732                         FACILITY:  [facility]   PHYSICIAN:  Lonia Blood, M.D.      DATE OF BIRTH:  1932/02/13   DATE OF ADMISSION:  05/01/2007  DATE OF DISCHARGE:                               DISCHARGE SUMMARY   DISCHARGE DATE:  To be determined.   DISCHARGE DIAGNOSES:  1. Severe C. difficile pancolitis.  2. Ventilatory dependent respiratory failure, now extubated.  3. Recent surgery for mycobacterium avian complex.  4. Coronary artery disease remotely, with atrial fibrillation episode      in the hospital.  5. Acute renal failure, not improved.  6. Sepsis secondary to colitis.  7. Chronic obstructive pulmonary disease.  8. Left pneumothorax following lobectomy.  9. History of tobacco abuse.  10.Peripheral vascular disease.  11.Transient metabolic acidosis and hyperphosphatemia.  12.Congestive heart failure systolic with an ejection fraction of 25%.  13.Anemia.  14.Hypokalemia transiently.  15.Leukocytosis.  16.Transient hyperglycemia.  17.Protein calorie malnutrition.  18.Transient dysphagia, now resolved.   DISCHARGE MEDICATIONS:  1. Vancomycin 500 mg p.o. q.6 h. for one day, then 125 mg p.o. q.6 h.      one week; then 125 mg p.o. b.i.d. for one week, then 125 mg p.o.      daily for one week, then 125 mg p.o. q. 72 h. For one week.  2. Lasix 40 mg daily.  3. Pepcid 20 mg daily.  4. Folic acid 1 mg daily.  5. Reglan 10 mg t.i.d.  6. Meloxicam  50 mg daily.  7. Simvastatin 10 mg daily.  8. Pletal 100 mg twice a day.  9. Albuterol nebulizer 2 puffs as needed.  10.Tylox one tablet as needed.  11.Advair Diskus 250/50 one puff b.i.d.  12.Spiriva HandiHaler one capsule daily.   DISPOSITION:  The patient will now be transferred to skilled nursing  facility.  He was initially discharged home, but the patient cannot go  home per the  family.  He seems to be weak, so he will go to a skilled  facility first.  If he does well from there he could be sent home.   CONSULTATIONS:  1. Pulmonary Critical Care Medicine, for intubation management up      until the patient was transferred out on May 14, 2007.  2. Dr. Nanetta Batty, [facility].  3. Dr. Beryle Lathe, nephrology.  4. Dr. Paulette Blanch Dam, infectious disease.  5. Dr. Manus Rudd, general surgery.  6. Dr. Lina Sar, gastroenterology.   PROCEDURES PERFORMED:  1. Flexible sigmoidoscopy, performed by Dr. Juanda Chance on May 04, 2007.  This showed severe pseudomembranous colitis from rectum to      18 cm around splenic flexure.  2. Intubation and mechanical ventilation, performed on May 03, 2007.  3. A 2-D echocardiogram performed on May 03, 2007.  This showed an      EF of 20-25%, multiple findings.  Please see report.  4. Abdominal x-ray on May 01, 2007.  This showed nonspecific,      although abnormal bowel-gas pattern; with subnormal size gas and      fluid-filled small bowel loose.  Post-surgical changes in the left      lung, may be residual small hydropneumothorax.  5. CT of the abdomen without contrast, May 01, 2007.  This showed      diffuse colitis, which may be infectious; and pseudomembranous      colitis should be strongly considered.  No evidence of bowel      perforation, obstruction or abscess.  A small amount of perihepatic      ascites and small left pleural effusion.  There was incidental      right adrenal adenoma.  6. CT of the pelvis showed pancolitis, also L4/L5  endplate      compression deformity, possibly acute or subacute.  7. Chest x-ray on May 02, 2007 showed severe COPD changes and      chronic scarring.  Another chest x-ray on May 03, 2007 showed      stable bilateral lower lobe airspace disease; endotracheal tube in      place.  Chest x-ray again on May 03, 2007 showed right lower      lobe airspace disease, stable to slightly worse; but no left lower      lung disease.  8. A PANDA tube placed on May 03, 2007 that showed subfeeding in      the stomach.  9. Chest x-ray on May 04, 2007 showed some improvement in basilar      lung density.  10.Renal ultrasound on May 03, 2007 showed no acute findings and      no hydronephrosis.  11.Chest x-ray on May 07, 2007 showed no significant change, with      endotracheal tube still in place.  12.Chest x-ray on May 08, 2007 showed extubation, slight      improvement in the aeration at the left base.  13.Chest x-ray on May 10, 2007 showed persistent infiltrates in      the lower lobes.  14.Abdominal x-rays on May 10, 2007 shows PANDA tube in distal      esophagus, and on May 11, 2007 showed feeding tube likely      within the gastric cardia.  15.Modified barium swallow on May 11, 2007 shows some moderate      dysphagia.   BRIEF HISTORY AND PHYSICAL:  Please refer to dictated history and  physical on admission by Dr. Lucita Ferrara.  In short however, the patient  is a 77 year old [place] man that presented with spurious diarrhea.  On admission he was found to be massively dehydrated.  He has been on  antibiotics for a while after recent lobectomy from Mycobacterium avian  complex by Dr. Edwyna Shell.  The patient came in and was admitted initially  with mainly questionable C. Difficile.   His vitals on admission showed mild tachycardia.  He was also slightly  hypotensive.  His last stain showed evidence of dehydration; but also  acute renal failure with a creatinine of 5.58 and BUN of 40; potassium  of 3.3.  The patient was admitted mainly at that point with pancolitis.  But, within 24 hours his condition worsened to the extent that he was  intubated and transferred to the intensive care unit.   HOSPITAL COURSE:  1. VENTILATORY-DEPENDENT RESPIRATORY FAILURE.  This  is a combination      of his COPD and  his multiple medical problems; including renal      failure, etc.  The patient was on the ventilator until May 08, 2007, when he was extubated.  During this period he was treated      with multiple antibiotics and multiple consults.  He went into      septic shock from C. difficile colitis.  He was on pressors.  He      was on Flagyl, Levaquin, vancomycin, clarithromycin and Primaxin at      some point.  Finally, the patient improved to the point where he      was extubated as indicated.  Since then he has been stable      pulmonary-wise.  2. CLOSTRIDIUM DIFFICILE SECONDARY TO PANCOLITIS.  Again, the      patient's main presentation was secondary to his pancolitis.      Gastroenterology and surgery, as well as ID were consulted.  The      patient was deemed to be a nonsurgical candidate for hemicolectomy.      He received multiple medications, as indicated above.  He is      finally now on oral vancomycin, in addition to probiotics.  He is      now on a taper of vancomycin, which will last a couple of weeks      more.  3. SYSTOLIC DYSFUNCTION CONGESTIVE HEART FAILURE.  The patient had an      ejection fraction of 25% this time around.  He had a prior history      of ischemic cardiomyopathy, with an ejection fraction in the 40s.      During this hospitalization he was seen and evaluated by Dr.      Nanetta Batty.  His congestive heart failure was treated and he      seems to have compensated at this point.  4. CHRONIC OBSTRUCTIVE PULMONARY DISEASE.  Again, the patient was      maintained on nebulizers after his intubation, as well as      extubation.  At this point he seems to be stable for discharge.  5. ANEMIA.  His hemoglobin and hematocrit levels are hanging around 8.      He received some blood transfusion while in the hospital; but      currently his hemoglobin is around 8.5 and stable.  6. STATUS POST LOBECTOMY.  This was performed  as an outpatient.  Prior      to this hospitalization he was followed in the hospital by Dr.      Edwyna Shell.  The patient is currently stable and he will have a follow-      up as an outpatient.  7. ACUTE RENAL FAILURE.  This was most likely secondary to      hypovolemia.  He was followed by Dr. Darrick Penna of the renal      service.  His kidney function has improved tremendously.  His      current creatinine is around 1.0; down from 5.5 when he was first      admitted.  During the hospitalization he did have CVVH while he was      in the ICU; but currently his renal function has improved      tremendously.   Otherwise, the rest of the medical problems were related to his severe  sepsis ventilatory-dependent respiratory failure, etc.  He is currently  stable in both his vitals and labs.  He is ready for  discharge at any  time.      Lonia Blood, M.D.  Electronically Signed     LG/MEDQ  D:  05/17/2007  T:  05/17/2007  Job:  [id number]   cc:   Nanetta Batty, M.D.  Acey Lav, MD  Wilmon Arms. Tsuei, M.D.  Hedwig Morton. Juanda Chance, MD

## 2010-08-12 NOTE — Assessment & Plan Note (Signed)
[facility]                             PULMONARY OFFICE NOTE   NAME:[name], [name]                         MRN:          [id number]  DATE:01/28/2007                            DOB:          Mar 04, 1932    SUBJECTIVE:  [name] comes in today for followup of his recent PFTs  as well as his PET scan.  He was initially referred to me with an  abnormal density in the left upper lobe that was somewhat concerning for  cancer.  His full pulmonary function studies showed fairly severe  emphysema with an FEV1 of 1.17 and an FEV1% of 43.  There was no  significant change with bronchodilators.  Air trapping was noted, and  there was a moderate decrease in DLCO.  Since the last visit, the  patient states that his breathing is much improved since being on the  Spiriva as well as the Symbicort.  Unfortunately, he has continued to  smoke a pack and a half of cigarettes a day, and I have spoken with him  about this.  He did undergo a PET scan, which showed the lesion in the  left upper lobe to be moderately hypermetabolic.  There was also a  satellite nodule close by at 6 mm that appeared the same.  There was  also posteriorly and inferiorly in the left upper lobe a spiculated  nodule that was 5 mm that had only faintly visible PET signal.  Finally,  the patient had a low left mediastinal node that was 9 mm with  hypermetabolism.  Patient was also found to have two foci in his  rectosigmoid area that was felt to be either physiologic or possibly  secondary to polyps.  I have gone over all of the information with the  patient in great detail and have answered all of his questions.   PHYSICAL EXAMINATION:  GENERAL:  He is a well-developed male in no acute  distress.  Blood pressure is 110/76, pulse 104, temperature 97.8.  Weight is 160  pounds.  O2 saturation on room air is 94%.  CHEST:  Decreased breath sounds throughout but no wheezes.  CARDIAC:  Regular rate and  rhythm.   IMPRESSION:  1. Fairly severe obstructive lung disease which is much improved since      being on Spiriva and Symbicort.  I suspect the patient's pulmonary      function studies are better than what he initially had since he has      been on a bronchodilator regimen for a period of time.  I also      believe that they would improve further if he would stop smoking,      and I have counseled him on this.  2. Left upper lobe lesion with a moderate increased PET signal with      questionable lymphadenopathy as well that may be suspicious for      cancer.  I have had a long discussion with the patient about      possibly following this versus biopsy versus resection.  It is  in a      very difficult area deep within the lung with no chance of      diagnosis by conventional bronchoscopy.  It could possibly be      diagnosed by electronavigational bronchoscopy, which is not      available here.  The patient could also undergo transthoracic      needle aspiration, but would also be at an extremely high risk of      pneumothorax and because of the small size and depth within the      lung, at considerable risk for false negative results.  I really      think at this point in time, we should give serious consideration      to possible resection.  Although the patient does not have a great      FEV1, I do think that he could tolerate a limited lobectomy or      possibly even a full lobectomy if he will  stay on medications and      consider smoking      cessation.  I would like to get an opinion from a thoracic surgeon      and will refer the patient to Dr. Karle Plumber for further      evaluation.  Patient is agreeable to this approach.     Barbaraann Share, MD,FCCP  Electronically Signed    KMC/MedQ  DD: 01/28/2007  DT: 01/29/2007  Job #: [id number]   cc:   Cathleen Fears, M.D.

## 2010-08-12 NOTE — Discharge Summary (Signed)
[name], [name]                ACCOUNT NO.:  [id number]   MEDICAL RECORD NO.:  [id number]          PATIENT TYPE:  OIB   LOCATION:  3703                         FACILITY:  [facility]   PHYSICIAN:  Nanetta Batty, M.D.   DATE OF BIRTH:  Nov 25, 1931   DATE OF ADMISSION:  12/27/2007  DATE OF DISCHARGE:  12/28/2007                               DISCHARGE SUMMARY   DISCHARGE DIAGNOSES:  1. Peripheral vascular disease with history of left femoral-popliteal      bypass with severe claudication.      a.     Pulmonary vein angiogram with total left femoral-popliteal       bypass graft.  2. Coronary artery disease with history of bypass grafting x3 with      left internal mammary artery to the left anterior descending      coronary artery and vein graft to the obtuse marginal to the      posterior descending artery.  This was done in 2009.  3. Chronic obstructive pulmonary disease.  4. Severe left ventricular dysfunction, ejection fraction of 25-35%.      The Myoview on the same day, November 03, 2007, revealed an ejection      fraction of 45%.  No ischemia.  5. Ischemic cardiomyopathy, may need implantable cardioverter-      defibrillator for primary prevention.   DISCHARGE MEDICATIONS:  1. Lipitor 20 mg at bedtime.  2. Advair 250/50 one inhalation twice a day.  3. Aspirin 81 mg daily.  4. Spiriva 1 inhalation daily.  5. Metoprolol 25 mg half a tablet twice a day.  6. Lanoxin 0.125 mg daily.  7. Lasix 20 mg daily.  8. K-Dur 20 mEq daily.  9. ProAir daily.  10.Altace 2.5 mg daily.  11.Oxygen at home as before.  12.Nitroglycerin sublingual as needed for chest pain.  He was given      prescription for nitroglycerin.   DISCHARGE INSTRUCTIONS:  1. Low-sodium, heart-healthy diet.  2. Increase activity slowly.  3. May shower.  4. No lifting for 2 days.  5. No driving for 2 days.  6. Wash cath site with soap and water.  Call if any bleeding,      swelling, or drainage.  7. Follow up with  Dr. Allyson Sabal on Friday, December 30, 2007 at 11:45 for      evaluation for ICD as well as evaluate post PV procedure.   HISTORY OF PRESENT ILLNESS:  A 77 year old gentleman, 3 months post  bypass grafting and history of PV disease with left fem-pop bypass  grafting in 2004, done for an occluded SFA.  He actually had improved  claudication after that procedure, but that had increased his  claudication.  Due to that, Dr. Allyson Sabal was concerned that he may need  intervention to his left leg.   Additionally, the patient has COPD.  He has stopped his tobacco use now.  His EF runs at 25-35%.   Allergies to TETANUS.   FAMILY HISTORY:  See H&P.   SOCIAL HISTORY:  See H&P.   REVIEW OF SYSTEMS:  See H&P.   PHYSICAL  EXAMINATION AT DISCHARGE:  VITAL SIGNS:  Blood pressure 99/62,  pulse 73, respirations 18, temperature of 97.4, oxygen saturation on 2 L  96%.  GROIN:  Right groin cath site stable.  HEART:  Within normal.  LUNGS: Within normal.   LABORATORY DATA:  CBC; hemoglobin 11.1, hematocrit 32.8, WBC 6.8,  platelets 273.  Basic metabolic panel; sodium 138, potassium 3.9,  chloride 107, CO2 of 26, glucose 102, BUN 12, creatinine 1.12, and  calcium 8.6.   Chest x-ray done prior to the procedure PA and lateral, stable exam.  No  evidence for new or progressive findings.  He has a nodularity seen in  the right mid lung on the previous study.  It was less evident on the  exam of December 21, 2007, and lungs were hyperexpanded.  Paronychia  mole scarring is again seen bilaterally.   HOSPITAL COURSE:  The patient came in as an outpatient for PV angio  secondary to increasing claudication.  He underwent the procedure and  was found to have total left fem-pop bypass graft. Totaled short segment  in the mid SFA.  Right lower extremity was normal.  Renal arteries were  stable as well.   The patient was kept overnight due to the lateness of the completion of  the study and by the next morning, he  was stable and ready for discharge  to home.  He will follow up as an outpatient with Dr. Allyson Sabal and was  ambulating prior to discharge with his oxygen.      Darcella Gasman. Annie Paras, N.P.      Nanetta Batty, M.D.  Electronically Signed    LRI/MEDQ  D:  12/28/2007  T:  12/29/2007  Job:  [id number]   cc:   Aida Puffer

## 2010-08-12 NOTE — H&P (Signed)
NAMESHOMARI, SCICCHITANO                ACCOUNT NO.:  [id number]   MEDICAL RECORD NO.:  [id number]          PATIENT TYPE:  INP   LOCATION:  4702                         FACILITY:  [facility]   PHYSICIAN:  Lucita Ferrara, MD         DATE OF BIRTH:  Jun 16, 1931   DATE OF ADMISSION:  05/01/2007  DATE OF DISCHARGE:                              HISTORY & PHYSICAL   HISTORY OF PRESENT ILLNESS:  The patient is a 77 year old, [place]  male who presents to [facility] with a chief complaint of  intractable diarrhea.  He states that he has been unable to hold any of  his p.o. intake as he has to keep continuing to go to the bathroom with  watery, non-mucus, foul-smelling, non-bloody diarrhea.  He denies any  focal abdominal pain.  He denies any urinary symptoms, dysuria, urinary  frequency, urgency or burning.  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  The  original surgery was February 21, 2007.  Apparently, he was readmitted  on December 11, until December 15, with left-sided pneumothorax.  He  sees Dr. Edwyna Shell for surgery.  The patient denies any cough, hemoptysis,  shortness of breath, chest pain.  He denies any orthopnea or paroxysmal  nocturnal dyspnea.  He denies any fevers or chills.  After his  intractable diarrhea, the patient states that he was dizzy upon standing  and ambulation and eventually fell down in his bathroom.  Prior to the  fall, the patient denies any chest pain or shortness of breath, urinary  or bowel incontinence or seizure activity.  The patient also denies head  trauma.  Apparently, he landed on his hand.  Otherwise, his review of  systems is negative.  In regards to his diarrhea, he has undergone a  course of antibiotics in the last 10 days.  He has never been treated or  diagnosed for Clostridium difficile.  Review of systems is otherwise  negative.  He denies any skin changes or focal neurological deficits.  A  12-point review of systems is negative.   PAST MEDICAL HISTORY:  1. Remote history of coronary artery disease, status post myocardial      infarction in 1986.  2. Chronic obstructive pulmonary disease.  3. Lung mass diagnosed Mycobacterium avium complex.  4. Tobacco abuse.  5. Left pneumothorax following above procedure.  6. History of significant alcohol use in the past.  7. Remote history, per records, of peripheral vascular disease.   PAST SURGICAL HISTORY:  Left VATS procedure.   ALLERGIES:  No known drug allergies.   MEDICATIONS:  1. Tylox as needed.  2. Advair 250/50 b.i.d.  3. Folic acid 1 mg daily.  4. Reglan.  5. Meloxicam.  6. Simvastatin 10 mg daily.  7. Pletal 100 mg b.i.d.  8. Albuterol two puffs p.r.n.  9. Spiriva.   SOCIAL HISTORY:  He no longer smokes.  He quit after his surgery in  November 2008.  He only drinks socially now.  FAMILY HISTORY:  Noncontributory.   PHYSICAL EXAMINATION:  GENERAL:  The patient is a cachectic-looking, 32-  year-old male in no acute distress.  VITAL SIGNS:  Temperature 98.1, pulse 114, respirations 18, pulse  oximetry 92% on room air.  HEENT:  Normocephalic, atraumatic.  Sclerae anicteric.  Mucous membranes  are dry.  NECK:  Supple.  No JVD.  No carotid bruits.  CARDIAC:  S1, S2.  Regular rate and rhythm.  ABDOMEN:  Soft, but there is diffuse tenderness in all quadrants of the  abdomen.  EXTREMITIES:  No clubbing, cyanosis or edema.  CHEST:  Clear to auscultation bilaterally.  No rhonchi, rales or  wheezes.  NEUROLOGIC:  The patient is alert and oriented x3.  Grossly intact.   LABORATORY DATA AND X-RAY FINDINGS:  White count 27.1, hemoglobin 12.7,  hematocrit 37.9, platelets 372, neutrophils 91.  Urinalysis with 30  protein, otherwise appearance cloudy and negative.  Urine microbiology  shows a few bacteria.  Complete metabolic panel shows a  potassium of  3.3, BUN 40, creatinine 5.58.  Alk phos 136, total protein of 4.6,  albumin 1.7, calcium 7.4, magnesium 1.9, phosphorus 6.8.   X-ray of the chest and abdomen shows abdominal, nonspecific bowel gas  pattern with top normal gas and fluid filled small bowel groups.  Recommend followup.  Postsurgical changes in left lung with possible  small, loculated hydrothorax.  CT of the abdomen shows pancolitis,  possibly pseudomembranous, no focal abscess, mild ascites, right adrenal  adenoma, L4 mild compression fracture.   ASSESSMENT/PLAN:  14. A 77 year old with intractable diarrhea, possibly and likely      secondary to Clostridium difficile colitis with pseudomembranous      involvement per CT scan, pancolitis.  This is most likely secondary      to Clostridium difficile given recent antibiotic use.  Will put him      on Clostridium difficile isolation, admit, intravenous fluids.      Start Flagyl 500 mg p.o. t.i.d.  Stool for Clostridium difficile,      Mycobacterium avium complex and bacterial cultures.  Will start      Solu-Medrol.  We may consider involving gastroenterology if      symptoms are not improving.  Follow white count and symptomatology.  2. Acute renal failure secondary to volume depletion and dehydration      secondary to #1.  3. Aggressive intravenous hydration with normal saline.  Strict in's      and out's.  Daily weights.  Monitor renal function, creatinine and      electrolytes.  May consider renal consult if renal function is not      improving.  May consider bilateral renal ultrasound for      completeness.  4. Leukocytosis secondary to #1.  Given his recent surgery, will cover      him with Levaquin for broad-spectrum coverage.  5. Status post left video-assisted thoracic surgery procedure.  Biopsy      showed Mycobacterium avium complex and no carcinoma or malignancy.      Chest x-ray now shows a small hydropneumothorax.  Note, the patient      actually  had a pneumothorax postop which was decided to be      monitored and watched conservatively.  The patient clinically not      dyspneic.  We may involve thoracic surgery if clinically there are      any changes, dyspnea or shortness of breath.  We will repeat the  chest x-ray and as above, will cover him for pulmonary organisms      with Levaquin.  6. Hypokalemia secondary to dehydration and diarrhea.  Okay for now      and repeat.  7. Chronic obstructive pulmonary disease.  Will continue albuterol and      Atrovent via high-flow nebulizer and prednisone as above.  8. History of Mycobacterium avium complex, as above.  Stool for      Mycobacterium avium complex and cover him with Zithromax 1500      orally weekly.  9. Coronary artery disease, status post myocardial infarction in 1986.      Continue with risk factor modification.  Continue Pletal.  10.Deep venous thrombosis prophylaxis with Lovenox and      gastrointestinal prophylaxis with Protonix.      Lucita Ferrara, MD  Electronically Signed     RR/MEDQ  D:  05/02/2007  T:  05/02/2007  Job:  [id number]

## 2010-08-12 NOTE — Assessment & Plan Note (Signed)
OFFICE VISIT   NICOLE, HAFLEY  DOB:  [birth year]                                        September 19, 2007  CHART #:  [id number]   HISTORY OF PRESENT ILLNESS:  Mr. Nickolson comes in today for a  postoperative followup.  He is status post CABG x3 on Aug 17, 2007.  Following his discharge from the hospital, he stayed at [facility]  for 1 week for reconditioning prior to returning home.  He has now  returned to home and is doing very well.  He saw Dr. Allyson Sabal last week in  followup and was given a good report.  His medications have remained  stable since his discharge home.  He is still having some weakness and  he is easily fatigued, but this is slowly improving.  His only complaint  today is some left leg discomfort, mainly a burning sensation on the  lateral aspect of his left lower extremity.  Apparently, this was  present prior to his surgery, and he has discussed it with Dr. Allyson Sabal,  who plans on a workup in the future for peripheral vascular occlusive  disease.  He has had no lower extremity edema, no chest discomfort, and  is slowly returning to his normal activities.   PHYSICAL EXAMINATION:  VITAL SIGNS:  Blood pressure is 132/74, pulse is  68, respirations 18, and O2 sat 95% on room air.  CHEST:  His sternal, right lower extremity, and bilateral chest tubes  incisions are all healing well.  Sternum is stable to palpation without  crepitus.  HEART:  Regular rate and rhythm.  LUNGS:  Clear.  EXTREMITIES:  Lower extremities show no edema.   IMAGING STUDIES:  Chest x-ray today looks good with only minimal  effusions.   ASSESSMENT/PLAN:  Mr. Graveline is doing very well, status post CABG.  He  is investigating the outpatient cardiac rehab program, which Dr. Allyson Sabal  has recommended, and I have encouraged  him to proceed with this.  We will see him back as needed, and he will  follow up as directed with Dr. Allyson Sabal.   Salvatore Decent. Cornelius Moras, M.D.  Electronically  Signed   GC/MEDQ  D:  09/19/2007  T:  09/19/2007  Job:  [id number]   cc:   Nanetta Batty, M.D.

## 2010-08-12 NOTE — Letter (Signed)
April 06, 2007   Barbaraann Share, MD,FCCP  [address]  [place]  [place] [address]   Re:  [name], QUINTELA                  DOB:  02-26-1932   Dear Mellody Dance:   I saw [name] back in the office today.  His chest x-ray showed  marked improvement.  He is doing well overall except for having  persistent diarrhea.  I told him to stop his Reglan and just because it  could be some C. difficile I started him on Flagyl 250 three times a day  and told him if it continues to let us know.  I also told him to get an  appointment to see you for followup of his lung disease.  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  He had a tough course  but he is now doing better.  His blood pressure was 95/55.  Pulse 100.  Respirations 20.  SATs were 94%.  Lungs were clear to auscultation and  percussion.   [name], M.D.  Electronically Signed   DPB/MEDQ  D:  04/06/2007  T:  04/06/2007  Job:  [id number]

## 2010-08-12 NOTE — H&P (Signed)
[name], [name]                ACCOUNT NO.:  [id number]   MEDICAL RECORD NO.:  [id number]          PATIENT TYPE:  INP   LOCATION:  2039                         FACILITY:  [facility]   PHYSICIAN:  [name], M.D. DATE OF BIRTH:  January 12, 1932   DATE OF ADMISSION:  03/18/2007  DATE OF DISCHARGE:                              HISTORY & PHYSICAL   CHIEF COMPLAINT:  Shortness of breath.   HISTORY OF PRESENT ILLNESS:  The patient is a 77 year old male, well-  known to [facility] following recent surgery with readmission.  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  The patient's original  surgery was done February 21, 2007.  He was readmitted December 11 to  December 15 for a left-sided pneumothorax.  He was seen in the office  yesterday and chest x-ray at that time revealed increased size of the  pneumothorax.  Dr. Edwyna Shell was attempting to use conservative management  of this.  He was seen on today's date by the home health nurse, where he  was found to have an oxygen saturation measured at 84% and evidence of  increased subcutaneous emphysema.  It was Dr. Scheryl Darter opinion that the  patient should be admitted at this time for further ongoing care of the  left pneumothorax.  The patient denied cough or sputum production.  He  denied hemoptysis.  He admitted to shortness of breath, primarily with  exertion.  He denied paroxysmal nocturnal dyspnea or orthopnea.  He  denied fevers or chills.  He does have some mild indigestion symptoms.  He has no chest pain, palpitations, or arrhythmias that he is aware of.  There is no peripheral edema.  At the time of his previous discharge, he  did have subcutaneous air throughout the thorax.  It has now progressed  to the neck as well as abdominal region.  He is admitted this  hospitalization for further evaluation and treatment.   PAST MEDICAL HISTORY:  1. History of  coronary artery disease, status post myocardial      infarction in 1986.  2. COPD.  3. Previous left lung mass with resection as described above.      Diagnosis mycobacterium avium.  4. Tobacco abuse.  5. Left pneumothorax following above procedure.  6. History of ETOH with significant use in the past.  7. Peripheral vascular disease.   PAST SURGICAL HISTORY:  Left VATS as described above.   ALLERGIES:  No known drug allergies.   CURRENT MEDICATIONS:  Were the same as at previous discharge:  1. He is currently on a prednisone taper 10 mg daily for 3 additional      days.  2. Tylox p.r.n.  3. Advair 250/50 b.i.d.  4. Folic acid 1 mg daily.  5. Reglan 10 mg 3 times daily with meals.  6. Meloxicam 15 mg daily.  7. Simvastatin 10 mg daily.  8. Pletal 100 mg b.i.d.  9. Albuterol 2 puffs p.r.n.  10.Spiriva daily.   REVIEW OF SYSTEMS:  Please see the history of present  illness for  pertinent positives and negatives, otherwise noncontributory.   SOCIAL HISTORY:  No recent tobacco use since his surgery but he does  have an extensive history in the past of 57 years of usage.  Alcohol  use:  Typical use is 3 to 4 alcoholic drinks daily, but again he has not  used any since his surgery.   FAMILY HISTORY:  Noncontributory.   PHYSICAL EXAMINATION:  VITAL SIGNS:  Currently pending.  Oxygen  saturation was measured at 84% on room air.  GENERAL APPEARANCE:  A 77 year old male in no acute distress, at rest in  the bed.  HEENT:  Normocephalic, atraumatic.  Extraocular movements are intact.  Oral mucosa is pink and moist.  Sclerae nonicteric.  Pharynx is clear  without exudates or erythema.  NECK:  Supple.  No jugular venous distention.  There is subcutaneous air  palpable throughout the neck.  PULMONARY:  There is obvious air leakage noted through the incision.  As  he inhales you can see movement in the skin.  His breaths are not  labored at rest.  Air exchange is equal throughout.   Breath sounds are  diminished to auscultation, primarily secondary to subcutaneous air  which is evident throughout the thorax and abdomen region as well as the  neck as previously mentioned.  I hear no wheezes, no rhonchi, no  crackles.  CARDIAC:  Regular rate and rhythm.  No murmurs, gallops or rubs  appreciated.  ABDOMEN:  Soft, nontender, nondistended.  GENITOURINARY/RECTAL:  Deferred.  EXTREMITIES:  Reveals no edema.  DP and PT pulses are absent to  palpation bilaterally.  NEUROLOGIC:  Grossly nonfocal.  He is alert and oriented x 4.   ASSESSMENT:  Persistent left lung air leak with pneumothorax and massive  subcutaneous air.   PLAN:  Admission and immediate chest x-ray to be obtained.  Dr. Edwyna Shell  plans to evaluate the study and determine whether he will put in a chest  tube at this time or continue watchful waiting.  We will also place him  on some oxygen and continue his current medications.      Rowe Clack, P.A.-C.      [name], M.D.  Electronically Signed    WEG/MEDQ  D:  03/18/2007  T:  03/19/2007  Job:  [id number]   cc:   [name], M.D.

## 2010-08-12 NOTE — Consult Note (Signed)
NAMEPRICE, LACHAPELLE                ACCOUNT NO.:  [id number]   MEDICAL RECORD NO.:  [id number]          PATIENT TYPE:  INP   LOCATION:  2115                         FACILITY:  [facility]   PHYSICIAN:  Fayrene Fearing L. Deterding, M.D.DATE OF BIRTH:  [birth year]   DATE OF CONSULTATION:  05/03/2007  DATE OF DISCHARGE:                                 CONSULTATION   REASON FOR CONSULTATION:  Acute renal failure.   HISTORY OF PRESENT ILLNESS:  This is a 77 year old gentleman who  presented to [facility] on the 1st with intractable diarrhea.  He has a  long history of COPD, peripheral vascular disease, history of coronary  artery disease, status post cath and stent in the past.  He also in late  November had a wedge resection with atypical  microbacterium as well  which may be in complex resected for lung nodule.  He was hospitalized  twice in December with a left-sided hydropneumothorax, question of  pneumonia.  He was given Amox , but was never proven to have an  infection.  He has never had known profound diarrhea.  Creatinine was  5.7 on admission.  We do not know of a past history of renal disease,  but we do not have any past medical history and he is not able to talk  and there is no family around and no family can be obtained.  He has  history of coronary artery disease and MI in 1986, COPD with lung mass,  tobacco abuse, left hydropneumothorax, history of alcohol use in the  past, peripheral vascular disease with fem-pop bypass, surgical history  of the VATs procedure and fem-pop bypass.   ALLERGIES:  No known drug allergies.   MEDICATIONS AT HOME:  1. Tylox as needed.  2. Advair 250/50 b.i.d.  3. Folic acid 1 mg.  4. Reglan p.r.n.  5. Meloxicam p.r.n.  6. Simvastatin 10 mg.  7. Pletal 100 mg b.i.d.  8. Albuterol 2 puffs p.r.n.  9. Spiriva.   SOCIAL HISTORY:  Apparently no longer smokes or drinks.   FAMILY HISTORY:  Noncontributory by history.   PAST MEDICAL HISTORY:  Noncontributory  by history.   OBJECTIVE:  GENERAL:  He is cachectic, emaciated, unkempt, intubated and  unresponsive.  VITAL SIGNS:  Blood pressure 115/62, heart rate 115.  He is on  amiodarone and ____________ to support his blood pressure and his heart  rate.  His heart rate is slightly irregular.  He has this morning had a  DC cardioversion x2 without results.  He also was found to have  echocardiogram with baseline EF 45 to 55% has gone down to 25%.  He is  intubated at this time.  HEENT:  Fundi benign.  NECK:  Without masses or thyromegaly.  LUNGS:  Decreased breath sounds, diffuse rhonchi and rales.  ABDOMEN:  Distended, very few bowel sounds.  Abdomen is tight.  CARDIOVASCULAR:  Irregular, grade 2/6 systolic murmur best heard along  the left sternal border.  PMI is at the 10th percentile of the left  intercostal space with bilateral femoral bruits.  No dorsalis pedal  pulses are palpable.  SKIN:  Some atrophic changes in lower extremities, some bruises;  otherwise, unremarkable.   LABORATORY DATA:  Sodium 140, potassium 6.2, chloride 113, bicarb 11,  creatinine 5.3, BUN 43, glucose 183.  Albumin 1.6.  CK is 182, MB 39.5.  Calcium 7.3.  Phosphorus 9.6.  Hemoglobin 9.7, white count 3600,  platelets 405,000.   ASSESSMENT:  1. Acute kidney injury most likely in the setting of hypoperfusion      with acute tubular necrosis.  At this time he was hypoperfused  and      was having low oxygen which was with autoregulation of blood flow      also.  He is acidemic and hyperkalemic at this time and needs renal      replacement therapy.  Will need to do some investigation and make      sure there are no other etiologies such as acute interstitial      nephritis or other causes at this time also.  2. Acute abdomen.  Question is could be pancolitis, but also must      question ischemic perforation.  3. Ventilatory respiratory failure.  He has some impaired renal      function already , and he has gotten  into trouble with volume of      the MI .  I suspect he may have significant cardiac dysfunction.  4. Sepsis.  Broad spectrum antibiotics on Primaxin.  I am not sure      this is not just systemic inflammatory response  syndrome.  5. Myocardial infarction per cardiology.  6. Chronic obstructive pulmonary disease.  7. Hydropneumothorax.   PLAN:  1. Femoral catheter.  2. CVHD .  3. Antibiotics.  4. Cultures.  5. KUB.           ______________________________  Llana Aliment Deterding, M.D.     JLD/MEDQ  D:  05/03/2007  T:  05/04/2007  Job:  [id number]   cc:   Critical Care Medicine  [facility]

## 2010-08-12 NOTE — Letter (Signed)
February 09, 2007   Dr. Mellody Dance Clance  [address]  [place], [place] [address]   Re:  [name], MORTIMER                  DOB:  08/03/1931   Dear Mellody Dance;   I saw [name] back in the office today.  As you know, this 77-year-  old patient retired in April and was found on a chest x-ray found to  have a left upper lung mass.  He continues to smoke 1 to 1-1/2 pack of  cigarettes a day.  Pulmonary function tests were done which showed a  DLCO of 55% and FEC of 2.63 with an FEV 1.17.  That lesion has a  positive on PET and a questionable satellite nodule and is also some  questionable uptake in the mediastinal node.  He is referred here for  possible resective surgery.  He has had no hemoptysis, chills, excessive  sputum, weight loss.  He has had good performance status.   His past medical history is significant for myocardial infarction in  1986 and has chronic obstructive pulmonary disease.   Medications include theophylline 300 mg twice a day, Meloxicam 15 mg a  day, simvastatin 10 mg daily, cilostazol 100 mg twice a day, Albuterol  and Spiriva and Symbicort.   He is alert.   HE HAS NO ALLERGIES.   His family history is noncontributory.   Social history he has five children, he is retired, worked in Designer, fashion/clothing.  Smokes a pack a day, also has one to two mixed drinks a day.   Review of systems:  He is 156 pounds.  He is 5 feet 7 inches.  His  weight has been stable.  Cardiac:  No angina or atrial fibrillation.  Pulmonary:  See history of present illness.  GI:  No GERD, dysphagia,  nausea, vomiting or constipation.  GU:  No kidney disease, dysuria or  frequent urination.  Vascular:  No claudication, DVTs or TIAs.  Neurological:  No headache, blackouts or esthesia.  Musculoskeletal:  No  joint pain or rash.  Psychiatric:  No psychiatric illnesses.  No  problems with eyesight or hearing.  Hematological:  No problems with  bleeding, clotting disorders or anemia.   Physical exam:  His  blood pressure 148/88, pulse 100, respirations 18,  saturations were 90%.  Head, eyes, ears, nose and throat:  Unremarkable.  Neck is supple without thyromegaly.  There is no supraclavicular or  axillary adenopathy.  Chest is clear to auscultation and percussion.  Heart regular sinus rhythm, no murmurs.  Abdomen is soft.  There is no  hepatosplenomegaly.  Pulses are 2+, there is no clubbing or edema.  Neurological:  He is oriented x3.  Sensory and motor are intact.  Cranial nerves are intact.   I think that the best plan for her is either to do a trisegmentectomy if  possible or a left upper lobectomy.  His pulmonary status is borderline.  I explained to him he needs to stop smoking.  I did explain in great  detail the risk of the procedure and he wants to proceed.  We will do a  node dissection the same time.  We have tentatively scheduled him for  November 23 at [facility].   [name], M.D.  Electronically Signed   DPB/MEDQ  D:  02/09/2007  T:  02/10/2007  Job:  [id number]

## 2010-08-12 NOTE — H&P (Signed)
[name], [name]                ACCOUNT NO.:  [id number]   MEDICAL RECORD NO.:  [id number]          PATIENT TYPE:  INP   LOCATION:  NA                           FACILITY:  [facility]   PHYSICIAN:  [name], M.D. DATE OF BIRTH:  22-Apr-1931   DATE OF ADMISSION:  DATE OF DISCHARGE:                              HISTORY & PHYSICAL   CHIEF COMPLAINT:  Lung mass.   HISTORY OF PRESENT ILLNESS:  This is a 77 year old patient who retied in  April of 2008, was found on chest x-ray to have left upper lobe mass.  She continues to smoke 1 to 1-1/2 packs of cigarettes a day. Pulmonary  function tests show a DLCO of 55% and FVC of 2.63 and a FEV1 of 1.17.  The lesion was positive on PET and there was a questionable satellite  nodule and a questionable uptake in the mediastinal nodes.  No  hemoptysis, chills, fever, excessive sputum.  His performance status is  good.   PAST MEDICAL HISTORY:  Significant for myocardial infarction in 1986 and  chronic obstructive pulmonary disease.   MEDICATIONS:  Theophylline 300 mg twice a day, meloxicam 15 mg a day,  simvastatin 10 mg a day.  Cilostazol 100 mg twice a day, albuterol,  Spiriva and Symbicort.   ALLERGIES:  HE HAS NO ALLERGIES.   FAMILY HISTORY:  Noncontributory.   SOCIAL HISTORY:  He has 5 children.  He is retired, works in Designer, fashion/clothing.  He smoke a pack of cigarettes a day.   REVIEW OF SYSTEMS:  He is 156 pounds, he is 5 feet, 7.  His weight has  been stable.  Cardiac:  No anterior atrial fibrillation,.  Pulmonary:  See history of present illness.  GI:  No GERD, dysphagia, nausea,  vomiting or constipation.  GU:  No kidney disease, dysuria, frequent  urination.  Vascular: No claudication, DVT, TIAs.  Neurologic:: No  headaches, blackouts or seizures.  Musculoskeletal:  No joint pain or  rash.  Psychiatric:  No depression or nervousness.  Eye/ENT: No changes  in eye sight or hearing.  Hematological:  No problems with bleeding,  clotting  disorders or anemia.   PHYSICAL EXAMINATION:  VITAL SIGNS:  His blood pressure is 148/80, pulse  100, respirations 18, saturations were 90%.  HEENT:  Head is atraumatic.  Eyes:  Pupils equal, round and reactive to  light and accommodation. Extraocular movements are normal.  Tympanic  membranes are intact.  Nose there is no septal deviation.  Throat is  without lesions.  NECK:  Supple without thyromegaly.  There is no supraclavicular or  axillary adenopathy.  CHEST:  Clear to auscultation and percussion.  HEART:  Regular sinus rhythm.  ABDOMEN:  Soft.  There is no hepatosplenomegaly.  EXTREMITIES:  Pulses 2+.  There is no clubbing or edema.  NEUROLOGIC:  He is oriented times 3.  Sensory and motor intact.  Cranial  nerves are intact.   IMPRESSION:  1. Left upper lobe mass, positive on positron emission tomography,      probably nonsmall-cell lung cancer.  2. Chronic obstructive pulmonary disease.  3. Tobacco abuse.  4. History of coronary artery disease.   PLAN:  Left VATS either wedge or segmentectomy, or possible lobectomy of  the left upper lobe.      [name], M.D.  Electronically Signed     DPB/MEDQ  D:  02/18/2007  T:  02/19/2007  Job:  [id number]

## 2010-08-12 NOTE — Discharge Summary (Signed)
NAMECARMICHAEL, [name]                ACCOUNT NO.:  [id number]   MEDICAL RECORD NO.:  [id number]          PATIENT TYPE:  INP   LOCATION:  2039                         FACILITY:  [facility]   PHYSICIAN:  [name], M.D. DATE OF BIRTH:  12/16/1931   DATE OF ADMISSION:  03/18/2007  DATE OF DISCHARGE:                               DISCHARGE SUMMARY   HISTORY OF THE PRESENT ILLNESS:  The patient is a 77 year old male well  known to [facility] following a recent surgery with readmission.  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  The patient's original  surgery was done February 21, 2007.  He was readmitted December 11th to  December 15th for a left-sided pneumothorax.  He was seen in the office  the day prior to this admission and chest x-ray at that time revealed  increased size of the pneumothorax.  Dr. Edwyna Shell was attempting to use  conservative management of this.  On the date of admission, he was seen  by his home health nurse and found to have an oxygen saturation of 84%  with evidence of increased subcutaneous emphysema.  Dr. Edwyna Shell felt the  patient should have readmission for chest tube placement.   PAST MEDICAL HISTORY:  1. Coronary artery disease status post myocardial infarction in 1986.  2. COPD.  3. Previous left lung mass with resection as described above,      diagnosis Mycobacterium avium.   OTHER DIAGNOSES:  1. Tobacco abuse.  2. Left pneumothorax following the above procedure.  3. History of ETOH with significant use in the past.  4. History of peripheral vascular disease.   PAST SURGICAL HISTORY:  Left video-assisted thoracoscopy as described  above.   ALLERGIES:  NO KNOWN DRUG ALLERGIES.   MEDICATIONS PRIOR TO ADMISSION:  1. Prednisone taper 10 mg daily.  2. Tylox p.r.n.  3. Advair 250/50 b.i.d.  4. Folic acid 1 mg daily.  5. Reglan 10 mg 3 times daily with meals.  6. Meloxicam  15 mg daily.  7. Simvastatin 10 mg daily.  8. Pletal 100 mg b.i.d.  9. Albuterol 2 puffs p.r.n.  10.Spiriva daily.   FAMILY HISTORY:  Please see the history and physical done at the time of  admission.   SOCIAL HISTORY:  Please see the history and physical done at the time of  admission.   REVIEW OF SYMPTOMS:  Please see the history and physical done at the  time of admission.   PHYSICAL EXAM:  Please see the history and physical done at the time of  admission.   HOSPITAL COURSE:  Patient was readmitted.  A chest tube was placed  without difficulty.  This was managed with a routine daily chest x-ray.  The patient had a small pneumothorax on March 22, 2007, but Dr.  Edwyna Shell determined that it was acceptable to remove the chest tube at  that time.  This was done.  The pneumothorax appears to be reasonably  stable.  He is clinically stable.  Oxygen saturations are 95% on  1 L.  Home oxygen has been arranged.  He is hemodynamically stable.  He is  tolerating low-level activities without significant difficulties.  He  will be discharged home on today's date after Dr. Edwyna Shell reviews the  patient's chest x-ray and determines if he is stable for discharge at  this time.   CONDITION ON DISCHARGE:  Stable and improving.   MEDICATIONS ON DISCHARGE:  1. Folic acid 1 mg daily.  2. Reglan 10 mg 3 times daily with meals.  3. Zocor 10 mg daily.  4. Advair 250/50 twice daily.  5. Pletal 100 mg twice daily.  6. Mobic 15 mg daily.  7. Ventolin inhaler 4 times daily.  8. Spiriva inhaler once daily.  9. Tylox 1 every 6 hours as needed.   FOLLOWUP:  Dr. Edwyna Shell on Tuesday, December 30th, at 3 p.m. with a chest  x-ray.   WOUND CARE:  The patient may clean his incisions gently with soap and  water.   ACTIVITIES:  Patient is to receive instructions regarding this.  No  heavy lifting.  No driving.  Increasing activities slowly.   FINAL DIAGNOSES:  1. Recurrent left pneumothorax due to  persistent air leak.  2. Other diagnoses as previously listed per the history.      Rowe Clack, P.A.-C.      [name], M.D.  Electronically Signed   WEG/MEDQ  D:  03/23/2007  T:  03/23/2007  Job:  [id number]   cc:   [name], M.D.

## 2010-08-12 NOTE — Op Note (Signed)
NAMESHANNON, [name]                ACCOUNT NO.:  [id number]   MEDICAL RECORD NO.:  [id number]          PATIENT TYPE:  INP   LOCATION:  2115                         FACILITY:  [facility]   PHYSICIAN:  Hedwig Morton. Juanda Chance, MD     DATE OF BIRTH:  1931-06-04   DATE OF PROCEDURE:  DATE OF DISCHARGE:                               OPERATIVE REPORT   NAME OF PROCEDURE:  Flexible sigmoidoscopy.   INDICATION:  This 77 year old gentleman was admitted with metabolic  acidosis, profuse diarrhea and dehydration, acute renal failure, and  oxygen-dependent COPD requiring intubation and ventilatory assistance.  We were asked to see him to for evaluation of diarrhea and abdominal  distention.  His radiographic studies confirm presence of diffuse  colitis.  He was started on vancomycin for presumed pseudomembranous  colitis.  Flexible sigmoidoscopy was done to rule out ischemic colitis  or severe ulcerative colitis.   Endoscope, Pentax, video endoscope sedation, Versed continuous infusion,  and fentanyl at 50 mcg IV.   FINDINGS:  Pentax single channel videoscope passed through the rectum to  sigmoid colon.  Patient was monitored by pulse oximeter.  He continued  to be monitored on the ventilator.  Patient had no bowl prep prior to  the procedure.  Rectal tone was somewhat decreased. Colonoscopic  examination showed pseudomembranes throughout the rectum extending into  the sigmoid colon, descending colon, and all the up through the  descending colon to the splenic flexure.  There was some yellow stool  coating the pseudomembranes throughout the colon which were rather heavy  circumferential and they were so abundant that essentially no normal  colon tissue was visualized.  After copious irrigation of the colon, we  could see some tissue in between the pseudomembranous exudate.  Multiple  biopsies were taken from the left colon.  No evidence of ulcerative  colitis or Crohn disease was noted.   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   PLAN:  The recommendations have been delineated  in  a progress note.  The patient will continue the vancomycin.  We will begin __________ .  Continue patient on bowel rest.  Start fluids and obtain surgical  consultation to follow patient in case he needs total colectomy.      Hedwig Morton. Juanda Chance, MD  Electronically Signed     DMB/MEDQ  D:  05/04/2007  T:  05/05/2007  Job:  [id number]   cc:   Wilmon Arms. Tsuei, M.D.  Coralyn Helling, MD

## 2010-08-12 NOTE — Consult Note (Signed)
[name], [name]                ACCOUNT NO.:  [id number]   MEDICAL RECORD NO.:  [id number]          PATIENT TYPE:  INP   LOCATION:  2115                         FACILITY:  [facility]   PHYSICIAN:  Wilmon Arms. Corliss Skains, M.D. DATE OF BIRTH:  07-06-1931   DATE OF CONSULTATION:  05/04/2007  DATE OF DISCHARGE:                                 CONSULTATION   REFERRING PHYSICIAN:  Hedwig Morton. Juanda Chance, M.D.   REASON FOR CONSULTATION:  Pseudomembranous colitis.   The patient is a 77 year old [place] male who presented to the  emergency department on May 01, 2007 with a chief complaint of  intractable diarrhea.  The patient stated that he had had several days  of almost continuous watery, foul-smelling nonbloody diarrhea.  He  denied any focal abdominal pain.  His p.o. intake had gone down  significantly due to the diarrhea.  At admission, he denied any urinary  symptoms.  The patient had recently finished a course of antibiotics.  He had had a left upper lobe wedge resection for a lung mass on February 21, 2007.  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  He was readmitted in December with a left-sided  pneumothorax.  He denies any pulmonary symptoms at this time.  The  patient was admitted to the hospital but has subsequently deteriorated.  He was moved to the intensive care unit on May 03, 2007 where he was  intubated.  He is now being managed by critical care medicine.  He is  currently on pressor support with dobutamine and vasopressin.  He has  been weaned off of his phenylephrine.  He has also developed renal  failure but is not currently being dialyzed.   PAST MEDICAL HISTORY:  1. Coronary artery disease status post myocardial infarction in 1986.  2. COPD.  3. Left upper lobe lung mass diagnosed as Mycobacterium avium complex.  4. Tobacco abuse.  5. Significant alcohol use.  6. Peripheral vascular disease.   PAST SURGICAL HISTORY:  1.  Left VATS procedure.  2. Left pneumothorax managed by chest tube.   ALLERGIES:  NONE.   HOME MEDICATIONS:  Tylox p.r.n., Advair, folic acid, Reglan, meloxicam,  simvastatin, Pletal, albuterol, and Spiriva.   SOCIAL HISTORY:  The patient stopped smoking after his thoracic surgery  in November of 2008.  He has a history of significant alcohol use with 3-  4 drinks a day but apparently has cut back.   FAMILY HISTORY:  Not obtainable.   PHYSICAL EXAMINATION:  GENERAL:  The patient is intubated and sedated.  This is a cachectic, emaciated-appearing male.  VITAL SIGNS:  Temperature 96.9, pulse 92, blood pressure 115/63,  respirations 16, 93% oxygen saturation on ventilator.  HEENT:  Sclerae anicteric.  LUNGS:  He was scattered rhonchi.  HEART:  Tachycardiac, regular rhythm.  ABDOMEN:  Distended, hypoactive bowel sounds.  No significant tenderness  on physical examination.   LABORATORY DATA:  White count 18.8, hemoglobin 9.5, platelet count 311.  Electrolytes:  Sodium 138, potassium 3.7, chloride 101, bicarb 30, BUN  26, creatinine 2.74.  Dr. Lina Sar of gastroenterology performed a flexible sigmoidoscopy  today.  This showed scattered diffuse pseudomembranes consistent with  pseudomembranous colitis.   IMPRESSION:  Pseudomembranous colitis resulting in severe sepsis.  The  patient apparently has improved slightly on his current management.   RECOMMENDATIONS:  Would continue his current medical management.  However, if he begins to deteriorate, must strongly consider an emergent  subtotal colectomy.  With his preadmission comorbidities and his level  of malnourishment, his ability to tolerate any type of large operation  would be fairly limited.  We will continue to follow along with you.      Wilmon Arms. Tsuei, M.D.  Electronically Signed     MKT/MEDQ  D:  05/04/2007  T:  05/05/2007  Job:  [id number]   cc:   Hedwig Morton. Juanda Chance, MD

## 2010-08-12 NOTE — Cardiovascular Report (Signed)
[name], [name]                ACCOUNT NO.:  [id number]   MEDICAL RECORD NO.:  [id number]          PATIENT TYPE:  OIB   LOCATION:  6527                         FACILITY:  [facility]   PHYSICIAN:  Nanetta Batty, M.D.   DATE OF BIRTH:  [birth year]   DATE OF PROCEDURE:  08/09/2007  DATE OF DISCHARGE:                            CARDIAC CATHETERIZATION   HISTORY:  [name] is a 77 year old thin-appearing white male with history  of CAD, status post remote cath in the past revealing occluded right  coronary artery with collaterals.  Previous PVOD was documented occluded  left SFA status post left fem-pop bypass graft by Dr. Orson Slick in 2004.  He has a long history of COPD, recently stopped smoking November of  2008.  He had surgical excision of mycobacterium avium-intracellulare by  Dr. Dewayne Shorter who protracted the hospital course.   The patient was recently seen by Dr. Jacinto Halim with increasing dyspnea and  CHF.  The 2-D echo revealed an EF decreased from 45%-25% and an Myoview  showed inferior scar with mild peri-infarct ischemia and anteroapical  ischemia.  A 6-French sheath was inserted into the right femoral artery  using standard Seldinger technique.  A 6-French left Judkins catheter  with a 6-French pigtail catheter were used for selective coronary  angiography, left ventriculography, supravalvular aortography, and  distal abdominal aortography.  Visipaque dye was used for entirety of  the case.  A retrograde aortic, ventricular pullback pressures were  recorded.   HEMODYNAMIC RESULTS:  Aortic systolic pressure 127, diastolic pressure  58, ventricular systolic pressure 128, end-diastolic pressure 12.   SELECTIVE CORONARY ANGIOGRAPHY.:  1. Left main; left main had a 80%-90% calcific stenosis in its distal      portion before the bifurcation of the LAD and circumflex.  2. LAD; free of significant disease, gave off moderate-sized first      diagonal branch.  3. Left circumflex; nondominant  and free of significant disease.  4. Right coronary; occluded in its midportion with grade II left-to-      right collaterals.  5. Left ventriculogram; RAO, left ventriculogram was performed using      25 mL of Visipaque dye 12 mL/second.  The overall LVEF was      estimated approximately 20%-25% with moderate global hypokinesia.  6. Supravalvular aortography; performed in the LAO view using 20 mL of      Visipaque dye 20 mL/sec x2.  There appeared to be a bovine arch.  I      was unable to selectively intubate the left subclavian, but there      appeared to be adequate flow without ostial disease.  7. Distal abdominal aortography; distal abdominal aortogram was      performed using 20 mL of Visipaque dye at 20 mL/second.  Renal      arteries were widely patent.  The infrarenal abdominal aorta and      iliac bifurcation appear free of significant atherosclerotic      changes.   IMPRESSION:  [name] has left main three-vessel disease with  moderate left ventricular dysfunction and positive Myoview with  dyspnea.  He will need coronary bypass grafting for complete revascularization.   The sheath was removed and pressures on the groin to achieve hemostasis.  The patient left the lab in stable condition.  [facility] has been notified.      Nanetta Batty, M.D.  Electronically Signed     JB/MEDQ  D:  08/09/2007  T:  08/10/2007  Job:  [id number]   cc:   Hilbert Odor, MD,FCCP

## 2010-08-12 NOTE — Procedures (Signed)
[name], [name]                ACCOUNT NO.:  [id number]   MEDICAL RECORD NO.:  [id number]          PATIENT TYPE:  AMB   LOCATION:  SDS                          FACILITY:  [facility]   PHYSICIAN:  Nanetta Batty, M.D.   DATE OF BIRTH:  1931-05-29   DATE OF PROCEDURE:  DATE OF DISCHARGE:                    PERIPHERAL VASCULAR INVASIVE PROCEDURE   HISTORY:  Teagan is a very pleasant 77 year old mildly overweight  Caucasian male with a history of CAD and PAD who had coronary artery  bypass grafting x3 in 2009 with a left main disease and severe LV  dysfunction. A LIMA to his LAD and vein graft to the OM and PDA.  His EF  at that time was 25% which was __________ in the 50%-55% by 2-D  echocardiogram in June of last year __________ Myoview stress testing.  He has had left fem-pop bypass grafting by Dr. Enos Fling __________  close after __________ stenting of his left __________ January 31, 2008  __________ claudication.  He did stop smoking over 2 years ago and has  had lung surgery by Dr. Dewayne Shorter, COPD, as well as hyperlipidemia.  Carotid upper extremities Dopplers have showed high-grade right  subclavian artery stenosis and he veers to the right when he walks and  he does not have retrograde right vertebral flow and he denies right  upper extremity claudication.  CT angiogram performed of his upper  extremities were unrevealing. He presents now for angiography to further  define his subclavian and vertebral anatomy and __________.   PROCEDURE AND DESCRIPTION:  The patient was brought to the second floor  of the [facility] in the postabsorptive state.  He  was premedicated with p.o. Valium.  His right groin was prepped and  shaved in the usual sterile fashion.  Xylocaine 1% was used for local  anesthesia.  A 5 French sheath was inserted into the right femoral  artery using standard Seldinger technique.  A 5 French __________  catheter and GB-1 catheter was used for  arch angiography __________  innominate with subclavian artery angiography.  __________ was used for  the entirety of the case.  Retrograde aortic pressure was monitored  during the case.   ANGIOGRAPHIC RESULTS:  1. The aortic arch was type 1.  2. __________ innominate was widely patent.  The right common carotid      arteries were patent as well.  The right subclavian artery had a      95% calcific stenosis right at the __________ of a large right      vertebral artery.  3. Left subclavian widely patent.  The left vertebral artery appeared      to be occluded.   IMPRESSION:  Mr. Filter has a high-grade calcific lesion at the  bifurcation of the super-dominant right vertebral and right subclavian  artery __________ percutaneous options for revascularization.  He does  appear to have __________ symptoms since he does veer to the right when  he walks.  I am wondering rather he is a candidate for a right carotid  and vertebral bypass.   The sheath was removed and  pressure was held to the groin to achieve  hemostasis.  The patient left the lab in stable condition.  He will  discharged home and seen as an  outpatient.  We will see him back in the  office in 1 week for follow-up.  I will discuss his case with Dr. Durene Cal to see if there are any surgical options.      Nanetta Batty, M.D.     JB/MEDQ  D:  04/22/2009  T:  04/22/2009  Job:  [id number]   cc:   [facility]  Aida Puffer  [facility]

## 2010-08-12 NOTE — Op Note (Signed)
NAMEFARAZ, PONCIANO                ACCOUNT NO.:  [id number]   MEDICAL RECORD NO.:  [id number]          PATIENT TYPE:  INP   LOCATION:  2306                         FACILITY:  [facility]   PHYSICIAN:  Guadalupe Maple, M.D.  DATE OF BIRTH:  01/25/1932   DATE OF PROCEDURE:  DATE OF DISCHARGE:                               OPERATIVE REPORT   PROCEDURE:  Intraoperative transesophageal echocardiography.   Mr. Christpher Stogsdill is a 77 year old white male with a history of severe  coronary artery disease and LV dysfunction who is scheduled to undergo  coronary artery bypass grafting by Dr. Cornelius Moras.  Intraoperative  transesophageal echocardiography was requested to evaluate the left  ventricular function to determine if any valvular pathology was present  and to serve as a monitor for intraoperative volume status.   The patient was brought to the operating room and general anesthesia was  induced without difficulty.  The trachea was intubated without  difficulty.  The transesophageal echocardiography probe was then  inserted into the esophagus without difficulty.   IMPRESSION PREBYPASS FINDINGS:  1. Aortic valve.  The aortic valve was trileaflet.  The leaflets were      mildly thickened, but opened well.  There was no aortic      insufficiency.  2. Mitral valve.  The mitral annulus appeared mildly dilated with      moderate mitral annular calcification, but the leaflets coapted      well without prolapse or fluttering and there was trace mitral      insufficiency.  3. Left ventricle.  The left ventricular cavity was dilated.  There      appeared to be global hypokinesis present with apparent akinesis of      the basal septum.  Ejection fraction was estimated 25%.  There was      no thrombus noted in the left ventricular apex.  Left ventricular      wall thickness measured 1.0-1.2 centimeters in diameter in diastole      in the suprapapillary level of the lateral wall and the anterior      wall.  4.  Right ventricle.  The right ventricular size appeared to be within      normal limits.  There was good contractility of the right      ventricular free wall.  5. Tricuspid valve.  The tricuspid valve was structurally intact.      There was no tricuspid insufficiency that could be appreciated.  6. Interatrial septum.  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  There was no evidence of      patent foramen ovale or atrial septal defect by a color Doppler or      bubble study.  7. Left atrium.  There was no thrombus noted in the left atrial and      left atrial appendage.  8. Ascending aorta.  The ascending aorta appeared to have a normal      contour with mild atheromatous disease present with some      calcification noted at the  area of the sinotubular ridge      anteriorly.  There was no aneurysmal dilation of the ascending      aorta.  9. Descending aorta.  There was moderate plaque noted in the      descending aorta.  The diameter measured 2.7 cm.   POST BYPASS FINDINGS:  1. Aortic valve.  The aortic valve was unchanged from the prebypass      study.  The leaflets opened well without aortic insufficiency.  2. Mitral valve.  There was trace mitral insufficiency, unchanged from      the prebypass study.  3. Left ventricle.  The left ventricular function appeared somewhat      improved from the prebypass study with increased contractility      noted at the anterior lateral and inferior walls with again      apparent akinesis in the basal      septum.  Ejection fraction estimated 25%-30%.  4. Right ventricle.  The right ventricular function appeared normal.      There was good contractility of the right ventricular free wall.      Normal right ventricular size.           ______________________________  Guadalupe Maple, M.D.     DCJ/MEDQ  D:  08/17/2007  T:  08/18/2007  Job:  [id number]

## 2010-08-12 NOTE — Cardiovascular Report (Signed)
[name], [name]                ACCOUNT NO.:  [id number]   MEDICAL RECORD NO.:  [id number]          PATIENT TYPE:  INP   LOCATION:  3733                         FACILITY:  [facility]   PHYSICIAN:  Nanetta Batty, M.D.   DATE OF BIRTH:  Feb 20, 1932   DATE OF PROCEDURE:  01/31/2008  DATE OF DISCHARGE:                            CARDIAC CATHETERIZATION   [name] is a 77 year old gentleman with a history of coronary artery bypass  grafting x3 in May 2009.  He has a moderate LV dysfunction with PAD  status post remote left fem-pop bypass grafting.  Followup Dopplers  showed occlusion of fem-pop bypass graft.  The patient has complained of  progressive claudication.  The patient had a failed attempt at  percutaneous revascularization on December 27, 2007, and presents now  for a re-attempt at PTA and stenting.   PROCEDURE DESCRIPTION:  The patient was brought to the second floor at  [facility] in the postabsorptive state.  He was  premedicated with p.o. Valium, IV Versed, and fentanyl.  His right groin  was prepped and shaved in the usual sterile fashion.  1% Xylocaine was  used for local anesthesia.  A 7-French terminal crossover sheath was  inserted into the right femoral artery using a standard Seldinger  technique.  A 5-French crossover catheter was used to obtain  contralateral access along with an 0.035 Wholey wire.  The patient  received 4000 units of heparin intravenously.  Visipaque dye was used  entirety of the case.  Retrograde aortic pressures monitored during the  case.  A 4-French Slip-Cath along with an 0.035 angled Glidewire were  used to cross the proximal cap of the total occlusion.  Ultimately, I  was able to get down approximately halfway from the proximal to distal  cap.  There was 2-vessel runoff.  I exchanged for an 0.018 Quick-Cross  and a PT Graphix 2, which ultimately was able to reenter the true lumen.  Using a Savvy 2 x 10 cm balloon, PTA was  then performed from the distal  true lumen up to the proximal true lumen and this was exchanged over a 4-  French end-hole for an 0.035 Wholey wire, which was then locked and  further PTA was performed with a 4 x 10 Powerflex.  Stenting was  performed with a 6 x 170 Bard LifeStent and post-dilatation with a 5 x  10 Powerflex resulting in reduction of moderately long segment total  occlusion to 0% residual with excellent runoff and no dissection.  The  patient tolerated the procedure well.  ACT was measured and the sheath  was removed.  Pressure was held in the groin to achieve hemostasis.  The  patient left the lab in stable condition.  He will be treated with  aspirin and Plavix and will be gently hydrated and discharged home in  the morning.  He will get followup Dopplers, ABIs.  I will see him back  in followup after that.      Nanetta Batty, M.D.  Electronically Signed     JB/MEDQ  D:  01/31/2008  T:  02/01/2008  Job:  [id number]   cc:   [facility] Second [facility]  [facility]

## 2010-08-12 NOTE — Assessment & Plan Note (Signed)
[facility]                         GASTROENTEROLOGY OFFICE NOTE   NAME:[name], [name]                         MRN:          [id number]  DATE:05/27/2007                            DOB:          10-28-1931    NEW PATIENT CONSULTATION   PROBLEM:  Diarrhea since November.   HISTORY:  Mr. [name] is a very nice, 77 year old, white male, who was  hospitalized at [facility], February 01 through February 17 of 2009 and  was seen by the GI Service early on in his hospital stay.  He was found  to have a severe C.diff colitis.  This was associated with sepsis and  vent-dependent respiratory failure.  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  He has been on antibiotics due to the Mycobacterium.  He was treated for his C.diff colitis with IV Flagyl and oral Vancomycin  and again was just discharged from the hospital on the 17th of February.  He went to a rehab center for 10 days, and today is his first day  returning home.  He was discharged on a tapering regimen of Vancomycin  and at this point has been on Vancomycin 125 b.i.d. over this past week.  He is to start 125 daily next week.  His wife is concerned because he is  still having diarrhea.  The patient says he is probably having five to  six bowel movements per day, that his stools are still definitely loose,  he may occasionally have to get up with a nocturnal episode of diarrhea,  he is having no abdominal pain, no cramping, no nausea or vomiting, does  feel that he is regaining his strength and has been eating fairly well.  He is ambulating currently with a walker and seems to get about fairly  well.  She is trying to preempt any problems due to the severity of his  recent illness.   The patient has had three bowel movements per day and says he has not  had  any incontinence.  His weight is down 27 pounds from prior to  December of 2008.   CURRENT MEDICATIONS:  1. Spiriva 18 mcg daily.  2. Meloxicam 15 daily.  3. Lasix 40 daily.  4. Pepcid 20 daily.  5. Reglan 10 mg b.i.d.  6. Advair 250/50 one b.i.d.  7. Folic acid daily.  8. Simvastatin 10 daily.  9. Pletal 100 mg b.i.d.  10.Potassium supplement 40 mEq daily.   ALLERGIES:  No known drug allergies.   PAST MEDICAL HISTORY:  1. As outlined above.  2. Lung resection in November of 2008.  After that, he developed a      left-sided pneumothorax and a possible pneumonia.  3. history of an MI in 48.  4. Prior ETOH.  5. Peripheral vascular disease, S/P fem-pop bypass.   FAMILY HISTORY:  Negative for colon cancer or polyps.   SOCIAL HISTORY:  The patient is single, lives alone, he is accompanied  by  a family member today.  He is retired from the [employer].  He  is an ex-smoker and no current ETOH.   REVIEW OF SYSTEMS:  In addition to GI is positive for shortness of  breath chronic and exacerbated by ambulation, lower extremity edema,  recent fever, and otherwise as outlined in the HPI.   PHYSICAL EXAMINATION:  GENERAL:  Well-developed, thin, chronically ill-  appearing, white male.  VITAL SIGNS:  Height is 5 foot 7, weight is 129, pulse is 120.  HEENT:  Nontraumatic, normocephalic, EOMI, PERRLA, sclerae anicteric.  CARDIOVASCULAR:  Regular rate and rhythm with S1 and S2, tachy.  LUNGS:  Decreased breath sounds bilaterally.  ABDOMEN:  Soft, bowel sounds are active, he is nontender, there is no  palpable mass or hepatosplenomegaly.  RECTAL EXAM:  Not done today.   IMPRESSION:  78. A 77 year old, white male with recent hospitalization for severe      Clostridium difficile colitis with sepsis and a subsequent      ventilator-dependent respiratory failure and acute renal failure,      now resolved, with a persistent diarrhea four weeks out from      initial diagnosis of  Clostridium difficile.  2. Chronic obstructive pulmonary disease.  3. Coronary artery disease.  4. Peripheral vascular disease.  5. History of lung resection in November of 2008, for a Mycobacterium      avium infection.   PLAN:  1. I feel that he is still recovering from the C.diff colitis and that      the diarrhea does not necessarily indicate that he is not resolving      his infection.  Will plan to continue a longer Vancomycin taper.      Complete another two weeks of Vancomycin 125 mg b.i.d., then 125      daily x 1 week, and then 125 every other day for one week.  2. Add Florastor one p.o. b.i.d. x 1 month.  3. Add Questran 4 g daily in 6 to 8 ounces of water, given two hours      apart from any other medication.  4. A low residue, lactose-free diet.  5. Follow up with Dr. Juanda Chance in four weeks after he has tapered off of      his antibiotics.  I also discussed the importance of avoiding other      treatments with antibiotics unless absolutely      necessary and informing any future physicians that he has had a      sepsis associated with C.diff colitis when they are making      decisions about antibiotic use.      Mike Gip, PA-C  Electronically Signed      Venita Lick. Russella Dar, MD, [facility]  Electronically Signed   AE/MedQ  DD: 05/27/2007  DT: 05/28/2007  Job #: [id number]   cc:   Aida Puffer

## 2010-08-12 NOTE — Consult Note (Signed)
NAMEASHLY, GOETHE                ACCOUNT NO.:  [id number]   MEDICAL RECORD NO.:  [id number]          PATIENT TYPE:  INP   LOCATION:  2301                         FACILITY:  [facility]   PHYSICIAN:  Casimiro Needle L. Reynolds, M.D.DATE OF BIRTH:  [birth year]   DATE OF CONSULTATION:  DATE OF DISCHARGE:                                 CONSULTATION   INPATIENT CONSULTATION:   REQUESTING PHYSICIAN:  Dr. Molli Knock   REASON FOR EVALUATION:  Weakness.   HISTORY OF PRESENT ILLNESS:  This is the initial inpatient consultation  evaluation of this 77 year old man with a past medical history which  includes heavy smoking, alcohol abuse, COPD and coronary artery disease  with MI in 61.  He has been worked up for the last couple of months  for a lung mass which was felt to most likely be cancerous.  He was  admitted to the hospital on November 24 and underwent a left-sided VATS  procedure with biopsy and the mass turned out to be an abscess.  Cultures ultimately grew out Klebsiella pneumonia and the patient has  been on intravenous antibiotics for that.  The patient had significant  nausea and vomiting following his procedure, and then on November  27/November 28 became confused and combative.  He then developed  worsening respiratory distress and had to be reintubated.  There has  been some concern that alcohol withdrawal had been contributing to his  mental status issues.  In any case, he was extubated on March 01, 2007.  Since then, he has been rather lethargic.  He was felt to be weak  in his proximal extremity muscles this morning, and that precipitated a  neurologic evaluation.  He has been receiving scheduled Ativan, which  was discontinued this morning.  He has also been on a prednisone taper.  He was receiving tube feeds while he was intubated and has been on  thiamine and folate.   PAST MEDICAL HISTORY:  Remarkable for:  1. COPD with long history of tobacco abuse.  2. He had a previous MI in  1986.  3. He has history of significant alcohol use.  4. Peripheral vascular disease.   FAMILY/SOCIAL/REVIEW OF SYSTEMS:  Per the admission H&P and per the  accompanying resident note by Dr. Phillips Odor which is reviewed and which is  endorsed.   MEDICATIONS:  His present medications include:  1. Insulin.  2. Atrovent.  3. Xopenex.  4. Levaquin.  5. Reglan 5 mg IV q.6 (discontinued this a.m.).  6. Metoprolol.  7. Thiamine.  8. Folate.  9. Ativan 2 mg q.8 hours (discontinued this morning).  10.He also receives p.r.n. Ativan, and last received Haldol and      Fentanyl on December 2.   PHYSICAL EXAMINATION:  VITAL SIGNS:  Temperature 98.2, blood pressure  106/51, pulse 110, respirations 27, O2 sat 95% on room air.  GENERAL EXAMINATION:  This is an ill-appearing male of average size,  supine in the hospital bed, no evident distress.  HEAD:  Cranium is normocephalic, atraumatic.  Oropharynx is benign.  NECK:  Supple without carotid or supraclavicular bruits.  HEART:  Regular rate without murmurs.  CHEST:  Coarse rhonchi.  NEUROLOGIC EXAMINATION:  Mental status:  He is initially very drowsy,  but is able to be brought to full alertness.  He is not able to answer  orientation questions.  His speech is quite dysarthric and difficult to  understand, it is unclear if his speech is appropriate to situation.  He  is able to follow one and some two-step commands when he is able to  sustain attention.  Cranial nerves:  Pupils are equal and reactive.  Examination of extraocular movements is somewhat limited, but he seems  to look to the left and right fairly well.  He blinks to threat from  both the left and the right.  He has good facial strength, and can move  tongue and palate symmetrically.  He also has excellent strength of neck  flexion.  Motor:  Normal bulk and tone.  He has absolutely normal  strength in all tested extremity muscles, including deltoid, biceps,  triceps, finger  extensors, hip flexors, quads, ankle dorsiflexors and  ankle plantar flexors.  Sensation:  Intact to light touch in all  extremities.  No neglect to double-simultaneous stimulation.  Coordination:  He does not cooperate with finger-to-nose testing.  Gait  is deferred.  Reflexes 2+ and symmetric.  Toes are downgoing  bilaterally.   LABORATORY REVIEW:  Labs per daily notes.  He has not had any neuro  imaging at this time.   IMPRESSION:  1. Encephalopathy following extubation.  This likely is multifactorial      and related to medications and his general illnesses.  Question      nutritional components as well.  2. Reported weakness.  Today, I examined this man at full alertness,      and he has normal motor strength.   RECOMMENDATIONS:  Would proceed with the usual support for a hospital-  induced encephalopathy, which would include nutritional support,  thiamine, and discontinuation of sedating medications and steroids as  much as possible.  If this persists, an EEG and imaging studies would be  appropriate.  Will follow as needed.   Thank you for the consultation.      Michael L. Thad Ranger, M.D.  Electronically Signed     MLR/MEDQ  D:  03/03/2007  T:  03/03/2007  Job:  [id number]   cc:   [name], M.D.  Charlcie Cradle Delford Field, MD, FCCP

## 2010-08-12 NOTE — Assessment & Plan Note (Signed)
OFFICE VISIT   [name], [name]  DOB:  06-01-1931                                        June 07, 2007  CHART #:  [id number]   Mr. Deal came for followup today.  He was recently in the hospital  with severe Clostridium difficile colitis and renal failure and  respiratory failure.  He is very weak but is ambulating well.  His blood  pressure was 112/18, pulse 100, respirations 18, sat's 94% on oxygen.  His chest x-ray looks good with no evidence of recurrence.  He is doing  well overall.  He will be seeing you in a couple of weeks and I will see  him again if he has any further pulmonary problems.   [name], M.D.  Electronically Signed   DPB/MEDQ  D:  06/07/2007  T:  06/08/2007  Job:  [id number]

## 2010-08-12 NOTE — Consult Note (Signed)
[name], [name]                ACCOUNT NO.:  [id number]   MEDICAL RECORD NO.:  [id number]          PATIENT TYPE:  INP   LOCATION:  3114                         FACILITY:  [facility]   PHYSICIAN:  Nanetta Batty, M.D.   DATE OF BIRTH:  03/11/1932   DATE OF CONSULTATION:  05/03/2007  DATE OF DISCHARGE:                                 CONSULTATION   The patient is a 77 year old male who has been seen by Dr. Allyson Bell in the  past.  He has a history of peripheral vascular disease and coronary  artery disease.  He had a left SFA occlusion in September of 2004 that  ultimately required left fem bypass grafting.  We have been following  him with Dopplers since then.  He last saw [place] in 2007.  He had a remote  MI in the 80's and reportedly had a catheterization by W. Viann Fish, M.D. some years ago which showed one blockage that was treated  medically.  A nuclear study done in our office in August of 2004 showed  inferior wall scar with minimal peri-infarct ischemia and subtle lateral  wall ischemia with an EF of 49%.  Echocardiogram showed an EF of 45-50%  with aortic valve sclerosis in 2004.  He has done fairly well from a  cardiac standpoint as far as we can tell from our office records, we  have not seen him since 2007.  In November of this year, he was admitted  for a left upper lobe mass.  It turns out this was MAC.  He had a VATS  procedure in December by [name], M.D.  This was complicated by  postprocedure pneumothorax and he was readmitted later in December for  chest tube.  He had done fairly well since then until a couple of days  prior to this admission when he had intractable diarrhea.  He was  admitted by the [facility] service on May 01, 2007.  It was felt that  he had acute renal failure secondary to volume depletion.  Creatinine  was 5.2.  His creatinine in December was 0.9.  We were asked to see him  today by the critical care service.  The patient developed  acute  respiratory distress, wide complex tachycardia, and hypotension.  His  EKG shows difficult to interpret rhythm.  He definitely has a wide  complex left bundle branch block type rhythm with a rate of 150.  His  pressure is 100.  The patient is intubated.  He is seen now by Dr.  Allyson Bell.   PAST MEDICAL HISTORY:  Remarkable for coronary artery disease, COPD,  vascular disease.  He also apparently drinks 3-4 glasses of alcohol a  day.  There is a note that he is allergic to tetanus in our office  records.   HOME MEDICATIONS:  Unknown.  Currently he is on:  1. Combivent.  2. Protonix.  3. Pletal.  4. Lovenox.   SOCIAL HISTORY:  He is a [place] male with five children.  He quit  smoking in 2003 apparently.  He does drink alcohol daily.  FAMILY HISTORY:  Unobtainable currently as is review of systems as the  patient is intubated.   PHYSICAL EXAMINATION:  VITAL SIGNS:  Blood pressure 100/60, pulse 140,  respirations per ventilator.  GENERAL:  He is a chronically ill-appearing, disheveled male in no acute  distress.  NECK:  Without obvious bruit.  CHEST:  Clear lung fields.  HEART:  Rapid rhythm without obvious murmur.  ABDOMEN:  Soft and nondistended.  EXTREMITIES:  Without obvious edema.  NEUROLOGY:  The patient is sedated.   LABORATORY DATA:  White count 17.4, hemoglobin 12.1, hematocrit 37.1,  platelets 432.  Sodium 140, potassium 4.2, BUN 44, creatinine 5.29.   CT scan done May 01, 2007, showed diffuse colitis without  perforation.  EKG is as noted above.   IMPRESSION:  1. Wide complex tachycardia.  This may be atrial fibrillation/flutter      with aberrancy.  Review of his office records show that he has not      previously had a left bundle branch block and he has been in sinus      rhythm.  2. Hypotension secondary to #1.  3. Acute renal insufficiency, possibly secondary to dehydration.  4. Recent intractable diarrhea.  5. Left upper lobe wedge resection  and VATS for MAC by Dr. Edwyna Shell.  6. Coronary artery disease with remote myocardial infarction and one      vessel blockage per history with low risk Myoview in 2004.  7. Mild left ventricular dysfunction on his last assessment with an      ejection fraction of 45% by echo in 2004.  8. History of daily alcohol use per records.   PLAN:  The patient was seen by Dr. Allyson Bell.  As the patient is sedated, we  may attempt DC cardioversion at this time.  We will get a stat echo and  cycle his enzymes.      [name], P.A.      Nanetta Batty, M.D.  Electronically Signed    LKK/MEDQ  D:  05/03/2007  T:  05/03/2007  Job:  [id number]

## 2010-08-15 NOTE — Cardiovascular Report (Signed)
NAMECALIL, Roberto                            ACCOUNT NO.:  [id number]   MEDICAL RECORD NO.:  [id number]                   PATIENT TYPE:  OIB   LOCATION:  2892                                 FACILITY:  [facility]   PHYSICIAN:  Nanetta Batty, M.D.                DATE OF BIRTH:  [birth year]   DATE OF PROCEDURE:  DATE OF DISCHARGE:  12/18/2002                              CARDIAC CATHETERIZATION   INDICATIONS:  [name] is a 77 year old divorced white male father of  five, grandfather of four grandchildren who had a remote cardiac  catheterization by W. Viann Fish, M.D. in the past and was found to have  one blocked artery with collateral filling.  He has not seen a  cardiologist since that time.  His risk factor profile includes tobacco  abuse and hyperlipidemia.  He does complain of left lower extremity  claudication which was somewhat improved with Pletal.  Dopplers suggested  short segment occlusion within the left SFA.  A Cardiolite stress test was  low risk.  He presents now for angiography and attempted endovascular  treatment of his left SFA for relief of symptoms of claudication.   PROCEDURE DESCRIPTION:  The patient was brought to the sixth floor [facility] in the postabsorptive state.  He  was premedicated with p.o. Valium.  His right groin was prepped and shaved  in the usual sterile fashion.  1% Xylocaine was used for local anesthesia.  A 5-French sheath was inserted into the right femoral artery using standard  Seldinger technique.  5-French tennis racquet catheter and IMA were used for  mid stream and distal abdominal aortography and bifemoral runoff.  ICD dye  was used for the entirety of the case.  Retrograde aortic pressure was  monitored during the case.   ANGIOGRAPHIC RESULTS:  1. Abdominal aorta:     a. Renal arteries:  Normal.     b. Infrarenal abdominal aorta:  Normal.  2. Left lower extremity:     a. Short segment  occlusion mid left SFA with reconstitution of the above        the knee popliteal by collaterals.     b. Two vessel runoff with an occluded anterior tibia.  3. Right lower extremity:     a. 60% focal mid to distal right SFA.     b. One vessel runoff via the perineal.   PROCEDURE DESCRIPTION:  The patient received 3000 units of heparin  intravenously.  Contralateral access was obtained with a short 5-French IMA  and 0.035 Wholey wire.  Backup was obtained with a 4 x 4 long Powerflex  balloon.  Attempts were made to cross the short segment total occlusion with  an 0.035 angled Glidewire.  However, this proved to be unsuccessful probably  because of the chronicity of the occlusion.  There were well-developed  collaterals.   IMPRESSION:  Unsuccessful  attempt at percutaneous revascularization of a  short segment total occlusion of the left SFA for relief of symptoms of  claudication.  The guidewire and balloon were removed.  An ACT was measured  and the sheath was removed.  Pressure was held on the groin to achieve  hemostasis.  The patient left the laboratory in  stable condition.  He will be discharged home later today as an outpatient.  We will see him back in the office in one to two weeks for follow-up.  He  would be a candidate for a __________ above the knee popliteal bypass graft  for relief of symptoms of claudication.  He left the laboratory in stable  condition.                                                Nanetta Batty, M.D.    Cordelia Pen  D:  12/18/2002  T:  12/18/2002  Job:  [id number]   cc:   [facility]   [facility]   Gabriel Earing, M.D.  [address]  [place]  [place] [address]  Fax: [phone]

## 2010-08-15 NOTE — Op Note (Signed)
NAMEGREGORI, [name]                            ACCOUNT NO.:  [id number]   MEDICAL RECORD NO.:  [id number]                   PATIENT TYPE:  INP   LOCATION:  5735                                 FACILITY:  [facility]   PHYSICIAN:  Lorre Munroe., M.D.            DATE OF BIRTH:  20-Mar-1932   DATE OF PROCEDURE:  01/31/2003  DATE OF DISCHARGE:                                 OPERATIVE REPORT   PREOPERATIVE DIAGNOSIS:  Ischemic left lower extremity.   POSTOPERATIVE DIAGNOSIS:  Ischemic left lower extremity.   OPERATION PERFORMED:  Left femoral-popliteal bypass with in situ saphenous  vein.   SURGEON:  Lebron Conners, M.D.   ASSISTANT:  Rose Phi. Maple Hudson, M.D.   ANESTHESIA:  General.   INDICATIONS FOR PROCEDURE:  The patient is a 77 year old white male with a  long history of smoking, who had severe limiting claudication which greatly  limited his work and activities.  He also had intermittent paresthesias of  the left foot.  Arteriography showed occlusion of the distal superficial  femoral artery with reconstitution above the knee with three-vessel runoff  to the foot.  He was admitted to the hospital for femoral to popliteal  bypass.   DESCRIPTION OF PROCEDURE:  After the patient was monitored and anesthetized  and had a Foley catheter and routine preparation of the left lower abdomen,  groin, thigh, leg and foot, I first made a longitudinal incision over the  femoral artery and dissected out and controlled the common, deep and  superficial femoral arteries.  I then found the long saphenous vein as it  approached the femoral vein, freed it up, ligated the branches locally.  Using a series of extensions of the incision, I dissected out the entire  long saphenous vein and the thigh down to knee.  It was of adequate size and  healthy in appearance all the way down.  I clipped or ligated side branches  and decided that I would perform the procedure with vein.  I then deepened  my  incision into the popliteal fossa just above the knee and found the  popliteal artery and controlled it proximally and distally.  It was a soft  vessel but had no pulse in it.  After the patient received 5000 units of  heparin and that was allowed to circulate.  I put a partial occlusion clamp  across the femoral vein at the saphenofemoral junction and cut the long  saphenous vein off just at the saphenofemoral junction.  Then I closed the  hole in the femoral vein with running 4-0 Prolene suture.  This did not  appear to cause any stenosis of the vein.  Using scissors, I cut the  proximal valves. I then made a longitudinal arteriotomy just over the  profunda femoris and did an end-to-side anastomosis of the vein to the  artery using 5-0 Prolene suture.  There was good flow into  the vein.  I then  divided the vein distally and cut the remaining valves with valvulotome and  there was very strong pulsatile flow through the vein which dilated up  nicely.  I then tightened controls on the popliteal artery and I cut the  vein to the necessary length and spatulated it and performed end-to-side  anastomosis using 6-0 Prolene suture taking great care not to twist the  vein. There was excellent through it.  Before finishing the anastomosis I  passed a 3 mm and 4 mm dilator up and found that there was excellent flow  and no stenosis.  There was a nice pulse in the distal popliteal artery and  excellent flow through it.  Hemostasis was obtained and I packed a little  bit of Surgicel around all of the anastomoses.  Sponge, needle and  instrument counts were correct.  I closed the  incisions with two layers of 2-0 Vicryl proximally and distally and one  layer of 2-0 Vicryl in the subcutaneous tissues through the midportion of  the incision.  I closed the skin with staples.  The patient went to recovery  room in satisfactory condition.                                               Lorre Munroe.,  M.D.    WB/MEDQ  D:  01/31/2003  T:  01/31/2003  Job:  [id number]

## 2010-10-03 ENCOUNTER — Encounter: Payer: Self-pay | Admitting: Pulmonary Disease

## 2010-10-06 ENCOUNTER — Encounter: Payer: Self-pay | Admitting: Pulmonary Disease

## 2010-10-06 ENCOUNTER — Ambulatory Visit (INDEPENDENT_AMBULATORY_CARE_PROVIDER_SITE_OTHER): Payer: Medicare Other | Admitting: Pulmonary Disease

## 2010-10-06 DIAGNOSIS — J438 Other emphysema: Secondary | ICD-10-CM

## 2010-10-06 DIAGNOSIS — J9610 Chronic respiratory failure, unspecified whether with hypoxia or hypercapnia: Secondary | ICD-10-CM

## 2010-10-06 NOTE — Patient Instructions (Signed)
No change in breathing medications. Stay as active as possible and wear oxygen followup with me in 6mos.

## 2010-10-06 NOTE — Progress Notes (Signed)
  Subjective:    Patient ID: [name], male    DOB: June 17, 1931, 77 y.o.   MRN: [id number]  HPI    Review of Systems  Constitutional: Negative for fever and unexpected weight change.  HENT: Positive for rhinorrhea and sneezing. Negative for ear pain, nosebleeds, congestion, sore throat, trouble swallowing, dental problem, postnasal drip and sinus pressure.   Eyes: Positive for redness and itching.  Respiratory: Positive for shortness of breath. Negative for cough, chest tightness and wheezing.   Cardiovascular: Positive for chest pain. Negative for palpitations and leg swelling.  Gastrointestinal: Negative for nausea and vomiting.  Genitourinary: Negative for dysuria.  Musculoskeletal: Negative for joint swelling.  Skin: Negative for rash.  Neurological: Negative for headaches.  Hematological: Bruises/bleeds easily.  Psychiatric/Behavioral: Negative for dysphoric mood. The patient is not nervous/anxious.        Objective:   Physical Exam Wd male in nad No purulence or discharge noted Chest with decreased bs, no wheezing Cor with rrr LE with very mild edema, no cyanosis  Alert, oriented, moves all 4        Assessment & Plan:

## 2010-10-12 ENCOUNTER — Encounter: Payer: Self-pay | Admitting: Pulmonary Disease

## 2010-10-12 NOTE — Assessment & Plan Note (Signed)
The pt is doing well from a pulmonary standpoint.  He feels his breathing is at baseline on current BD regimen and oxygen.  I have asked him to stay active, but keep out of the heat.  He is to call if any increased symptoms.  No change in current meds.

## 2010-12-17 ENCOUNTER — Other Ambulatory Visit: Payer: Self-pay | Admitting: *Deleted

## 2010-12-17 MED ORDER — FLUTICASONE-SALMETEROL 250-50 MCG/DOSE IN AEPB: 1.0000 | INHALATION_SPRAY | Freq: Two times a day (BID) | RESPIRATORY_TRACT | Status: DC

## 2010-12-19 LAB — COMPREHENSIVE METABOLIC PANEL
ALT: 13
ALT: 13
ALT: 16
ALT: 22
ALT: 22
ALT: 23
AST: 20
AST: 21
AST: 23
AST: 43 — ABNORMAL HIGH
AST: 45 — ABNORMAL HIGH
AST: 69 — ABNORMAL HIGH
AST: 69 — ABNORMAL HIGH
AST: 78 — ABNORMAL HIGH
Albumin: 1.6 — ABNORMAL LOW
Albumin: 1.6 — ABNORMAL LOW
Albumin: 1.6 — ABNORMAL LOW
Albumin: 1.6 — ABNORMAL LOW
Albumin: 1.7 — ABNORMAL LOW
Albumin: 1.9 — ABNORMAL LOW
Albumin: 2 — ABNORMAL LOW
Alkaline Phosphatase: 129 — ABNORMAL HIGH
Alkaline Phosphatase: 137 — ABNORMAL HIGH
BUN: 34 — ABNORMAL HIGH
BUN: 43 — ABNORMAL HIGH
CO2: 23
CO2: 25
CO2: 26
Calcium: 6.7 — ABNORMAL LOW
Calcium: 7.2 — ABNORMAL LOW
Calcium: 7.4 — ABNORMAL LOW
Calcium: 8 — ABNORMAL LOW
Calcium: 8.5
Chloride: 113 — ABNORMAL HIGH
Chloride: 113 — ABNORMAL HIGH
Chloride: 116 — ABNORMAL HIGH
Creatinine, Ser: 1.68 — ABNORMAL HIGH
Creatinine, Ser: 2.05 — ABNORMAL HIGH
Creatinine, Ser: 3.83 — ABNORMAL HIGH
Creatinine, Ser: 5.75 — ABNORMAL HIGH
GFR calc Af Amer: 12 — ABNORMAL LOW
GFR calc Af Amer: 12 — ABNORMAL LOW
GFR calc Af Amer: 13 — ABNORMAL LOW
GFR calc Af Amer: 15 — ABNORMAL LOW
GFR calc Af Amer: 19 — ABNORMAL LOW
GFR calc Af Amer: 20 — ABNORMAL LOW
GFR calc Af Amer: 39 — ABNORMAL LOW
GFR calc Af Amer: 49 — ABNORMAL LOW
GFR calc non Af Amer: 10 — ABNORMAL LOW
GFR calc non Af Amer: 17 — ABNORMAL LOW
GFR calc non Af Amer: 32 — ABNORMAL LOW
Potassium: 3.9
Potassium: 4.5
Potassium: 6.2 — ABNORMAL HIGH
Sodium: 138
Sodium: 139
Sodium: 139
Sodium: 141
Sodium: 141
Total Bilirubin: 0.5
Total Bilirubin: 0.5
Total Bilirubin: 0.6
Total Protein: 4.1 — ABNORMAL LOW
Total Protein: 4.6 — ABNORMAL LOW
Total Protein: 5.2 — ABNORMAL LOW

## 2010-12-19 LAB — POCT I-STAT EG7
Acid-Base Excess: 3 — ABNORMAL HIGH
Acid-Base Excess: 6 — ABNORMAL HIGH
Acid-Base Excess: 9 — ABNORMAL HIGH
Acid-base deficit: 1
Acid-base deficit: 4 — ABNORMAL HIGH
Bicarbonate: 27.8 — ABNORMAL HIGH
Bicarbonate: 31.5 — ABNORMAL HIGH
Bicarbonate: 31.7 — ABNORMAL HIGH
Bicarbonate: 35.9 — ABNORMAL HIGH
Calcium, Ion: 1.03 — ABNORMAL LOW
Calcium, Ion: 1.03 — ABNORMAL LOW
Calcium, Ion: 1.09 — ABNORMAL LOW
Calcium, Ion: <0.25 — ABNORMAL LOW
Calcium, Ion: <0.25 — ABNORMAL LOW
HCT: 28 — ABNORMAL LOW
HCT: 29 — ABNORMAL LOW
HCT: 30 — ABNORMAL LOW
HCT: 31 — ABNORMAL LOW
Hemoglobin: 10.5 — ABNORMAL LOW
Hemoglobin: 10.5 — ABNORMAL LOW
Hemoglobin: 10.9 — ABNORMAL LOW
Hemoglobin: 9.9 — ABNORMAL LOW
O2 Saturation: 61
O2 Saturation: 62
O2 Saturation: 66
O2 Saturation: 74
Operator id: 245011
Operator id: 245011
Operator id: 245011
Potassium: 3.5
Potassium: 3.7
Potassium: 3.7
Potassium: 4.1
Sodium: 137
Sodium: 142
Sodium: 142
TCO2: 27
TCO2: 33
TCO2: 33
TCO2: 38
pCO2, Ven: 44.8 — ABNORMAL LOW
pCO2, Ven: 51.6 — ABNORMAL HIGH
pH, Ven: 7.393 — ABNORMAL HIGH
pH, Ven: 7.397 — ABNORMAL HIGH
pH, Ven: 7.401 — ABNORMAL HIGH
pH, Ven: 7.431 — ABNORMAL HIGH
pO2, Ven: 27 — CL
pO2, Ven: 32
pO2, Ven: 37

## 2010-12-19 LAB — CARBOXYHEMOGLOBIN
Carboxyhemoglobin: 1
Carboxyhemoglobin: 1.3
Methemoglobin: 0.8
Methemoglobin: 0.9
Methemoglobin: 0.9
Methemoglobin: 1.2
Methemoglobin: 1.3
O2 Saturation: 60.5
O2 Saturation: 64.6
O2 Saturation: 72.4
O2 Saturation: 73.7
O2 Saturation: 78
Total hemoglobin: 10 — ABNORMAL LOW
Total hemoglobin: 10.1 — ABNORMAL LOW
Total hemoglobin: 9.3 — ABNORMAL LOW
Total hemoglobin: 9.5 — ABNORMAL LOW

## 2010-12-19 LAB — URINALYSIS, ROUTINE W REFLEX MICROSCOPIC
Glucose, UA: NEGATIVE
Glucose, UA: NEGATIVE
Ketones, ur: NEGATIVE
Leukocytes, UA: NEGATIVE
Protein, ur: 30 — AB
Protein, ur: 30 — AB
Protein, ur: NEGATIVE
Specific Gravity, Urine: 1.015
Specific Gravity, Urine: 1.023
Urobilinogen, UA: 0.2
Urobilinogen, UA: 0.2
pH: 5

## 2010-12-19 LAB — DIFFERENTIAL
Basophils Absolute: 0
Basophils Absolute: 0
Basophils Relative: 0
Basophils Relative: 0
Eosinophils Absolute: 0
Eosinophils Absolute: 0
Eosinophils Relative: 0
Lymphocytes Relative: 5 — ABNORMAL LOW
Lymphocytes Relative: 6 — ABNORMAL LOW
Lymphs Abs: 0.3 — ABNORMAL LOW
Monocytes Absolute: 0.9
Monocytes Absolute: 1.1 — ABNORMAL HIGH
Monocytes Relative: 6
Neutro Abs: 23.5 — ABNORMAL HIGH
Neutro Abs: 24.6 — ABNORMAL HIGH
Neutro Abs: 29.4 — ABNORMAL HIGH
Neutrophils Relative %: 88 — ABNORMAL HIGH
Neutrophils Relative %: 91 — ABNORMAL HIGH
WBC Morphology: INCREASED

## 2010-12-19 LAB — RENAL FUNCTION PANEL
Albumin: 1.6 — ABNORMAL LOW
Albumin: 1.6 — ABNORMAL LOW
Albumin: 1.7 — ABNORMAL LOW
Albumin: 1.8 — ABNORMAL LOW
Albumin: 1.9 — ABNORMAL LOW
Albumin: 2 — ABNORMAL LOW
Albumin: 2 — ABNORMAL LOW
Albumin: 1.8 — ABNORMAL LOW
Albumin: 1.8 — ABNORMAL LOW
Albumin: 1.9 — ABNORMAL LOW
Albumin: 2.1 — ABNORMAL LOW
BUN: 17
BUN: 17
BUN: 30 — ABNORMAL HIGH
BUN: 31 — ABNORMAL HIGH
BUN: 32 — ABNORMAL HIGH
BUN: 35 — ABNORMAL HIGH
BUN: 36 — ABNORMAL HIGH
BUN: 7
BUN: 8
CO2: 27
CO2: 28
CO2: 30
CO2: 31
CO2: 33 — ABNORMAL HIGH
Calcium: 7 — ABNORMAL LOW
Calcium: 7 — ABNORMAL LOW
Calcium: 7.9 — ABNORMAL LOW
Calcium: 8 — ABNORMAL LOW
Calcium: 8.2 — ABNORMAL LOW
Calcium: 8.2 — ABNORMAL LOW
Calcium: 8.3 — ABNORMAL LOW
Calcium: 7.6 — ABNORMAL LOW
Calcium: 7.7 — ABNORMAL LOW
Calcium: 7.9 — ABNORMAL LOW
Calcium: 7.9 — ABNORMAL LOW
Calcium: 8 — ABNORMAL LOW
Chloride: 101
Chloride: 109
Chloride: 111
Chloride: 114 — ABNORMAL HIGH
Chloride: 116 — ABNORMAL HIGH
Chloride: 100
Chloride: 104
Creatinine, Ser: 1.24
Creatinine, Ser: 1.45
Creatinine, Ser: 1.54 — ABNORMAL HIGH
Creatinine, Ser: 1.56 — ABNORMAL HIGH
Creatinine, Ser: 1.56 — ABNORMAL HIGH
Creatinine, Ser: 1.57 — ABNORMAL HIGH
Creatinine, Ser: 2.74 — ABNORMAL HIGH
Creatinine, Ser: 1.04
Creatinine, Ser: 1.08
Creatinine, Ser: 1.09
Creatinine, Ser: 1.11
GFR calc Af Amer: 28 — ABNORMAL LOW
GFR calc Af Amer: 53 — ABNORMAL LOW
GFR calc Af Amer: 57 — ABNORMAL LOW
GFR calc Af Amer: >60
GFR calc Af Amer: >60
GFR calc Af Amer: >60
GFR calc Af Amer: >60
GFR calc Af Amer: >60
GFR calc non Af Amer: 23 — ABNORMAL LOW
GFR calc non Af Amer: 47 — ABNORMAL LOW
GFR calc non Af Amer: 48 — ABNORMAL LOW
GFR calc non Af Amer: 52 — ABNORMAL LOW
GFR calc non Af Amer: 57 — ABNORMAL LOW
GFR calc non Af Amer: >60
GFR calc non Af Amer: >60
GFR calc non Af Amer: >60
GFR calc non Af Amer: >60
Glucose, Bld: 111 — ABNORMAL HIGH
Glucose, Bld: 113 — ABNORMAL HIGH
Glucose, Bld: 135 — ABNORMAL HIGH
Glucose, Bld: 73
Glucose, Bld: 86
Glucose, Bld: 99
Phosphorus: 2.8
Phosphorus: 2.8
Phosphorus: 2.9
Phosphorus: 2.9
Phosphorus: 3.2
Phosphorus: 3.6
Phosphorus: 4
Phosphorus: 3.6
Phosphorus: 4
Phosphorus: 4.1
Phosphorus: 4.3
Phosphorus: 4.5
Potassium: 3 — ABNORMAL LOW
Potassium: 3.6
Potassium: 3.6
Potassium: 3.7
Potassium: 4.1
Potassium: 3.2 — ABNORMAL LOW
Potassium: 3.4 — ABNORMAL LOW
Sodium: 134 — ABNORMAL LOW
Sodium: 138
Sodium: 138
Sodium: 140
Sodium: 141
Sodium: 146 — ABNORMAL HIGH
Sodium: 135
Sodium: 142

## 2010-12-19 LAB — BLOOD GAS, ARTERIAL
Bicarbonate: 10.2 — ABNORMAL LOW
Bicarbonate: 18.5 — ABNORMAL LOW
O2 Saturation: 99
PEEP: 5
Patient temperature: 98.6
Patient temperature: 98.6
RATE: 12
TCO2: 11.3
pCO2 arterial: 36.2
pH, Arterial: 7.08 — CL
pH, Arterial: 7.327 — ABNORMAL LOW
pO2, Arterial: 132 — ABNORMAL HIGH

## 2010-12-19 LAB — CBC
HCT: 25.3 — ABNORMAL LOW
HCT: 25.8 — ABNORMAL LOW
HCT: 27.5 — ABNORMAL LOW
HCT: 28.3 — ABNORMAL LOW
HCT: 29.2 — ABNORMAL LOW
HCT: 29.2 — ABNORMAL LOW
HCT: 30.9 — ABNORMAL LOW
HCT: 37.1 — ABNORMAL LOW
Hemoglobin: 12.1 — ABNORMAL LOW
Hemoglobin: 8.6 — ABNORMAL LOW
Hemoglobin: 9.3 — ABNORMAL LOW
MCHC: 32.9
MCHC: 33.1
MCHC: 33.3
MCHC: 33.4
MCHC: 33.5
MCHC: 33.6
MCHC: 33.6
MCHC: 33.8
MCHC: 34.2
MCHC: 33.3
MCHC: 33.5
MCV: 86.3
MCV: 86.9
MCV: 87
MCV: 87
MCV: 87.1
MCV: 87.1
MCV: 87.5
MCV: 87.9
MCV: 88.1
MCV: 86.6
MCV: 86.7
MCV: 86.9
Platelets: 311
Platelets: 341
Platelets: 348
Platelets: 388
Platelets: 392
Platelets: 405 — ABNORMAL HIGH
Platelets: 418 — ABNORMAL HIGH
Platelets: 453 — ABNORMAL HIGH
Platelets: 459 — ABNORMAL HIGH
Platelets: 381
Platelets: 458 — ABNORMAL HIGH
Platelets: 477 — ABNORMAL HIGH
Platelets: 489 — ABNORMAL HIGH
Platelets: 499 — ABNORMAL HIGH
RBC: 2.87 — ABNORMAL LOW
RBC: 3.06 — ABNORMAL LOW
RBC: 3.24 — ABNORMAL LOW
RBC: 3.27 — ABNORMAL LOW
RBC: 4.34
RBC: 2.93 — ABNORMAL LOW
RBC: 3 — ABNORMAL LOW
RDW: 14.1
RDW: 14.6
RDW: 14.9
RDW: 15
RDW: 15.1
RDW: 15.2
RDW: 15.3
RDW: 15.6 — ABNORMAL HIGH
RDW: 16.2 — ABNORMAL HIGH
RDW: 16.2 — ABNORMAL HIGH
RDW: 16.2 — ABNORMAL HIGH
RDW: 16.2 — ABNORMAL HIGH
RDW: 16.4 — ABNORMAL HIGH
WBC: 11.6 — ABNORMAL HIGH
WBC: 18.8 — ABNORMAL HIGH
WBC: 19.2 — ABNORMAL HIGH
WBC: 20.3 — ABNORMAL HIGH
WBC: 26.9 — ABNORMAL HIGH
WBC: 30.6 — ABNORMAL HIGH
WBC: 10.9 — ABNORMAL HIGH
WBC: 7.1

## 2010-12-19 LAB — HEPATIC FUNCTION PANEL
Alkaline Phosphatase: 97
Indirect Bilirubin: 0.4
Total Protein: 3.9 — ABNORMAL LOW

## 2010-12-19 LAB — MAGNESIUM
Magnesium: 1.6
Magnesium: 1.7
Magnesium: 1.7
Magnesium: 1.8
Magnesium: 1.8
Magnesium: 1.9
Magnesium: 1.6
Magnesium: 1.7
Magnesium: 1.7

## 2010-12-19 LAB — POCT I-STAT 3, ART BLOOD GAS (G3+)
Acid-Base Excess: 2
Acid-Base Excess: 20 — ABNORMAL HIGH
Acid-Base Excess: 6 — ABNORMAL HIGH
Acid-base deficit: 1
Bicarbonate: 25 — ABNORMAL HIGH
O2 Saturation: 91
O2 Saturation: 94
O2 Saturation: 97
O2 Saturation: 98
Operator id: 244801
Operator id: 280981
Patient temperature: 96.2
Patient temperature: 96.2
TCO2: 22
TCO2: 26
TCO2: 27
pCO2 arterial: 35.9
pCO2 arterial: 36.1
pH, Arterial: 7.456 — ABNORMAL HIGH
pH, Arterial: 7.462 — ABNORMAL HIGH
pH, Arterial: 7.483 — ABNORMAL HIGH
pH, Arterial: 7.486 — ABNORMAL HIGH
pO2, Arterial: 76 — ABNORMAL LOW

## 2010-12-19 LAB — BASIC METABOLIC PANEL
BUN: 18
BUN: 33 — ABNORMAL HIGH
BUN: 35 — ABNORMAL HIGH
CO2: 28
Calcium: 7.7 — ABNORMAL LOW
Calcium: 8.3 — ABNORMAL LOW
Chloride: 110
Chloride: 112
Creatinine, Ser: 1.32
Creatinine, Ser: 1.54 — ABNORMAL HIGH
GFR calc Af Amer: 54 — ABNORMAL LOW
GFR calc non Af Amer: 11 — ABNORMAL LOW
GFR calc non Af Amer: 45 — ABNORMAL LOW
GFR calc non Af Amer: 53 — ABNORMAL LOW
Glucose, Bld: 146 — ABNORMAL HIGH
Glucose, Bld: 53 — ABNORMAL LOW
Glucose, Bld: 63 — ABNORMAL LOW
Potassium: 3.7
Potassium: 4.2
Sodium: 140
Sodium: 150 — ABNORMAL HIGH

## 2010-12-19 LAB — POCT I-STAT 7, (LYTES, BLD GAS, ICA,H+H)
Acid-base deficit: 1
Acid-base deficit: 6 — ABNORMAL HIGH
Bicarbonate: 21.5
Bicarbonate: 28.7 — ABNORMAL HIGH
Calcium, Ion: 1.03 — ABNORMAL LOW
Calcium, Ion: 1.03 — ABNORMAL LOW
Calcium, Ion: 1.06 — ABNORMAL LOW
Calcium, Ion: 1.11 — ABNORMAL LOW
HCT: 30 — ABNORMAL LOW
Hemoglobin: 10.2 — ABNORMAL LOW
O2 Saturation: 90
O2 Saturation: 96
O2 Saturation: 99
Operator id: 245011
Operator id: 245011
Patient temperature: 96.3
Potassium: 3.4 — ABNORMAL LOW
Potassium: 3.6
Potassium: 4
Sodium: 142
Sodium: 143
TCO2: 23
TCO2: 30
pCO2 arterial: 28.5 — ABNORMAL LOW
pCO2 arterial: 29.3 — ABNORMAL LOW
pCO2 arterial: 38.6
pH, Arterial: 7.459 — ABNORMAL HIGH
pO2, Arterial: 122 — ABNORMAL HIGH

## 2010-12-19 LAB — LACTIC ACID, PLASMA: Lactic Acid, Venous: 1.2

## 2010-12-19 LAB — CULTURE, BAL-QUANTITATIVE W GRAM STAIN

## 2010-12-19 LAB — CROSSMATCH: Antibody Screen: NEGATIVE

## 2010-12-19 LAB — TRIGLYCERIDES: Triglycerides: 57

## 2010-12-19 LAB — DIC (DISSEMINATED INTRAVASCULAR COAGULATION)PANEL
INR: 1.8 — ABNORMAL HIGH
Smear Review: NONE SEEN
aPTT: 159 — ABNORMAL HIGH

## 2010-12-19 LAB — URINE MICROSCOPIC-ADD ON

## 2010-12-19 LAB — CARDIAC PANEL(CRET KIN+CKTOT+MB+TROPI)
CK, MB: 122 — ABNORMAL HIGH
CK, MB: 39.5 — ABNORMAL HIGH
CK, MB: 6.9 — ABNORMAL HIGH
Relative Index: 21.7 — ABNORMAL HIGH
Relative Index: 44.7 — ABNORMAL HIGH
Total CK: 273 — ABNORMAL HIGH
Total CK: 327 — ABNORMAL HIGH
Troponin I: 11.99
Troponin I: 5.2
Troponin I: 8.25

## 2010-12-19 LAB — HEMOGLOBIN AND HEMATOCRIT, BLOOD
HCT: 29.3 — ABNORMAL LOW
Hemoglobin: 9.8 — ABNORMAL LOW

## 2010-12-19 LAB — CULTURE, BLOOD (ROUTINE X 2)
Culture: NO GROWTH
Culture: NO GROWTH

## 2010-12-19 LAB — CHOLESTEROL, TOTAL: Cholesterol: 82

## 2010-12-19 LAB — PROTIME-INR
INR: 1.6 — ABNORMAL HIGH
INR: 1.8 — ABNORMAL HIGH
Prothrombin Time: 19.9 — ABNORMAL HIGH
Prothrombin Time: 21.4 — ABNORMAL HIGH

## 2010-12-19 LAB — CLOSTRIDIUM DIFFICILE EIA: C difficile Toxins A+B, EIA: 5

## 2010-12-19 LAB — PREALBUMIN
Prealbumin: 12.2 — ABNORMAL LOW
Prealbumin: 21.6

## 2010-12-19 LAB — PHOSPHORUS: Phosphorus: 3.5

## 2010-12-19 LAB — B-NATRIURETIC PEPTIDE (CONVERTED LAB)
Pro B Natriuretic peptide (BNP): 621 — ABNORMAL HIGH
Pro B Natriuretic peptide (BNP): 981 — ABNORMAL HIGH

## 2010-12-19 LAB — HIV ANTIBODY (ROUTINE TESTING W REFLEX): HIV: NONREACTIVE

## 2010-12-19 LAB — THEOPHYLLINE LEVEL: Theophylline Lvl: 0.5 — ABNORMAL LOW

## 2010-12-19 LAB — D-DIMER, QUANTITATIVE: D-Dimer, Quant: 5.96 — ABNORMAL HIGH

## 2010-12-24 LAB — POCT I-STAT 3, ART BLOOD GAS (G3+)
Acid-base deficit: 2
Acid-base deficit: 3 — ABNORMAL HIGH
Acid-base deficit: 4 — ABNORMAL HIGH
Acid-base deficit: 4 — ABNORMAL HIGH
Bicarbonate: 21.1
Bicarbonate: 21.9
Bicarbonate: 22.1
Bicarbonate: 22.5
Bicarbonate: 22.6
Bicarbonate: 22.8
Bicarbonate: 23.6
O2 Saturation: 86
O2 Saturation: 87
O2 Saturation: 91
O2 Saturation: 92
O2 Saturation: 94
Operator id: 203371
Operator id: 203371
Operator id: 203371
Operator id: 297551
Operator id: 3342
Patient temperature: 37.4
Patient temperature: 37.4
Patient temperature: 38
Patient temperature: 38.1
TCO2: 24
TCO2: 24
TCO2: 25
TCO2: 25
pCO2 arterial: 39.7
pCO2 arterial: 40.5
pCO2 arterial: 45.9 — ABNORMAL HIGH
pCO2 arterial: 46 — ABNORMAL HIGH
pCO2 arterial: 48.1 — ABNORMAL HIGH
pCO2 arterial: 50.4 — ABNORMAL HIGH
pH, Arterial: 7.294 — ABNORMAL LOW
pH, Arterial: 7.333 — ABNORMAL LOW
pH, Arterial: 7.357
pH, Arterial: 7.359
pH, Arterial: 7.378
pO2, Arterial: 322 — ABNORMAL HIGH
pO2, Arterial: 55 — ABNORMAL LOW
pO2, Arterial: 65 — ABNORMAL LOW
pO2, Arterial: 67 — ABNORMAL LOW
pO2, Arterial: 67 — ABNORMAL LOW
pO2, Arterial: 78 — ABNORMAL LOW
pO2, Arterial: 80

## 2010-12-24 LAB — BASIC METABOLIC PANEL
BUN: 14
BUN: 16
BUN: 17
CO2: 26
CO2: 28
CO2: 28
CO2: 31
Calcium: 8.5
Calcium: 8.6
Calcium: 8.6
Calcium: 8.8
Calcium: 8.9
Chloride: 113 — ABNORMAL HIGH
Creatinine, Ser: 1.03
GFR calc Af Amer: 53 — ABNORMAL LOW
GFR calc Af Amer: 57 — ABNORMAL LOW
GFR calc Af Amer: >60
GFR calc non Af Amer: 44 — ABNORMAL LOW
GFR calc non Af Amer: 47 — ABNORMAL LOW
GFR calc non Af Amer: >60
GFR calc non Af Amer: >60
Glucose, Bld: 110 — ABNORMAL HIGH
Glucose, Bld: 117 — ABNORMAL HIGH
Glucose, Bld: 135 — ABNORMAL HIGH
Glucose, Bld: 98
Potassium: 3.8
Potassium: 4
Potassium: 4.2
Potassium: 4.6
Potassium: 4.9
Sodium: 139
Sodium: 139
Sodium: 139
Sodium: 140
Sodium: 143

## 2010-12-24 LAB — URINALYSIS, ROUTINE W REFLEX MICROSCOPIC
Bilirubin Urine: NEGATIVE
Glucose, UA: NEGATIVE
Hgb urine dipstick: NEGATIVE
Ketones, ur: NEGATIVE
Nitrite: NEGATIVE
Specific Gravity, Urine: 1.038 — ABNORMAL HIGH
Urobilinogen, UA: 0.2
pH: 5

## 2010-12-24 LAB — TYPE AND SCREEN
ABO/RH(D): B POS
Antibody Screen: NEGATIVE

## 2010-12-24 LAB — BLOOD GAS, ARTERIAL
Acid-base deficit: 0.5
Bicarbonate: 23.3
Bicarbonate: 22.1
Patient temperature: 97.6
TCO2: 24.4
TCO2: 23.2
pCO2 arterial: 34.4 — ABNORMAL LOW
pH, Arterial: 7.438
pH, Arterial: 7.425
pO2, Arterial: 62.9 — ABNORMAL LOW

## 2010-12-24 LAB — POCT I-STAT, CHEM 8
BUN: 23
Calcium, Ion: 1.3
Chloride: 107
Glucose, Bld: 133 — ABNORMAL HIGH
HCT: 32 — ABNORMAL LOW
Hemoglobin: 10.9 — ABNORMAL LOW
Potassium: 4.7
Sodium: 144
TCO2: 23

## 2010-12-24 LAB — CBC
HCT: 34.4 — ABNORMAL LOW
HCT: 29.5 — ABNORMAL LOW
HCT: 30.2 — ABNORMAL LOW
HCT: 30.7 — ABNORMAL LOW
HCT: 30.8 — ABNORMAL LOW
HCT: 31 — ABNORMAL LOW
HCT: 31.2 — ABNORMAL LOW
HCT: 36.1 — ABNORMAL LOW
Hemoglobin: 11.3 — ABNORMAL LOW
Hemoglobin: 10.1 — ABNORMAL LOW
Hemoglobin: 10.1 — ABNORMAL LOW
Hemoglobin: 10.2 — ABNORMAL LOW
Hemoglobin: 10.4 — ABNORMAL LOW
Hemoglobin: 10.6 — ABNORMAL LOW
Hemoglobin: 10.8 — ABNORMAL LOW
Hemoglobin: 9.8 — ABNORMAL LOW
MCHC: 32.9
MCHC: 32.9
MCHC: 33.2
MCHC: 33.3
MCV: 79.3
MCV: 81.4
MCV: 81.7
MCV: 81.8
Platelets: 176
Platelets: 183
Platelets: 357
Platelets: 465 — ABNORMAL HIGH
RBC: 3.77 — ABNORMAL LOW
RBC: 3.82 — ABNORMAL LOW
RBC: 3.85 — ABNORMAL LOW
RBC: 4.04 — ABNORMAL LOW
RBC: 4.58
RDW: 16.9 — ABNORMAL HIGH
RDW: 17.1 — ABNORMAL HIGH
RDW: 17.2 — ABNORMAL HIGH
RDW: 17.3 — ABNORMAL HIGH
RDW: 18 — ABNORMAL HIGH
RDW: 18.3 — ABNORMAL HIGH
WBC: 8.3
WBC: 9.1
WBC: 9.3

## 2010-12-24 LAB — POCT I-STAT 4, (NA,K, GLUC, HGB,HCT)
Glucose, Bld: 110 — ABNORMAL HIGH
Glucose, Bld: 112 — ABNORMAL HIGH
Glucose, Bld: 113 — ABNORMAL HIGH
Glucose, Bld: 117 — ABNORMAL HIGH
HCT: 22 — ABNORMAL LOW
HCT: 28 — ABNORMAL LOW
Hemoglobin: 10.9 — ABNORMAL LOW
Hemoglobin: 7.5 — CL
Hemoglobin: 9.5 — ABNORMAL LOW
Operator id: 3342
Operator id: 3342
Potassium: 3.9
Potassium: 4.9
Potassium: 5.1
Sodium: 136
Sodium: 142
Sodium: 143

## 2010-12-24 LAB — MAGNESIUM
Magnesium: 2.3
Magnesium: 2.8 — ABNORMAL HIGH

## 2010-12-24 LAB — COMPREHENSIVE METABOLIC PANEL
AST: 18
Albumin: 3.9
Alkaline Phosphatase: 62
Alkaline Phosphatase: 68
BUN: 13
BUN: 18
Chloride: 106
Chloride: 110
Creatinine, Ser: 1.07
GFR calc non Af Amer: >60
Glucose, Bld: 103 — ABNORMAL HIGH
Potassium: 3.6
Potassium: 4.3
Total Bilirubin: 0.7
Total Bilirubin: 0.8

## 2010-12-24 LAB — LIPID PANEL
Cholesterol: 170
HDL: 58
LDL Cholesterol: 88
Triglycerides: 122

## 2010-12-24 LAB — APTT: aPTT: 35

## 2010-12-24 LAB — HEMOGLOBIN AND HEMATOCRIT, BLOOD: HCT: 18.2 — ABNORMAL LOW

## 2010-12-24 LAB — PLATELET COUNT: Platelets: 213

## 2010-12-24 LAB — CREATININE, SERUM
Creatinine, Ser: 0.98
GFR calc non Af Amer: 42 — ABNORMAL LOW
GFR calc non Af Amer: >60

## 2010-12-24 LAB — POCT I-STAT 3, VENOUS BLOOD GAS (G3P V)
Bicarbonate: 20.9
O2 Saturation: 72
TCO2: 22
pCO2, Ven: 44.8 — ABNORMAL LOW
pH, Ven: 7.276
pO2, Ven: 43

## 2010-12-24 LAB — HEMOGLOBIN A1C
Mean Plasma Glucose: 126
Mean Plasma Glucose: 101

## 2010-12-29 LAB — BASIC METABOLIC PANEL
Chloride: 107
Creatinine, Ser: 1.12
GFR calc Af Amer: >60
GFR calc non Af Amer: >60

## 2010-12-29 LAB — CBC
MCV: 79.3
RBC: 4.14 — ABNORMAL LOW
WBC: 6.8

## 2010-12-30 LAB — BASIC METABOLIC PANEL
CO2: 26
Chloride: 107
GFR calc Af Amer: >60
Potassium: 4.6
Sodium: 140

## 2010-12-30 LAB — CBC
HCT: 34.9 — ABNORMAL LOW
Hemoglobin: 11.6 — ABNORMAL LOW
MCHC: 33.2
MCV: 81.3
RBC: 4.3

## 2011-01-02 LAB — CBC
HCT: 32 — ABNORMAL LOW
HCT: 36.1 — ABNORMAL LOW
MCHC: 33.9
MCHC: 34.8
MCV: 90
MCV: 90.2
Platelets: 421 — ABNORMAL HIGH
Platelets: 449 — ABNORMAL HIGH
Platelets: 451 — ABNORMAL HIGH
RBC: 3.54 — ABNORMAL LOW
RBC: 3.92 — ABNORMAL LOW
WBC: 11.2 — ABNORMAL HIGH
WBC: 12.6 — ABNORMAL HIGH

## 2011-01-02 LAB — COMPREHENSIVE METABOLIC PANEL
AST: 15
Albumin: 2.1 — ABNORMAL LOW
Alkaline Phosphatase: 64
CO2: 28
Chloride: 103
Creatinine, Ser: 0.93
GFR calc Af Amer: >60
GFR calc non Af Amer: >60
Potassium: 3.4 — ABNORMAL LOW
Total Bilirubin: 0.6

## 2011-01-02 LAB — BASIC METABOLIC PANEL
BUN: 14
Calcium: 7.9 — ABNORMAL LOW
Creatinine, Ser: 1.13
GFR calc Af Amer: >60

## 2011-01-05 LAB — BASIC METABOLIC PANEL
BUN: 13
BUN: 13
BUN: 14
BUN: 17
BUN: 25 — ABNORMAL HIGH
BUN: 25 — ABNORMAL HIGH
CO2: 26
CO2: 27
CO2: 28
CO2: 31
Calcium: 8.5
Calcium: 8.6
Calcium: 8.7
Calcium: 9
Chloride: 101
Chloride: 101
Chloride: 102
Chloride: 103
Chloride: 99
Creatinine, Ser: 0.79
Creatinine, Ser: 0.83
Creatinine, Ser: 0.87
Creatinine, Ser: 0.92
GFR calc Af Amer: >60
GFR calc Af Amer: >60
GFR calc Af Amer: >60
GFR calc non Af Amer: >60
GFR calc non Af Amer: >60
GFR calc non Af Amer: >60
GFR calc non Af Amer: >60
Glucose, Bld: 103 — ABNORMAL HIGH
Glucose, Bld: 125 — ABNORMAL HIGH
Glucose, Bld: 132 — ABNORMAL HIGH
Glucose, Bld: 180 — ABNORMAL HIGH
Potassium: 3.2 — ABNORMAL LOW
Potassium: 3.7
Potassium: 3.9
Potassium: 3.9
Potassium: 4
Sodium: 137

## 2011-01-05 LAB — CBC
HCT: 32.5 — ABNORMAL LOW
HCT: 33.7 — ABNORMAL LOW
HCT: 36.5 — ABNORMAL LOW
HCT: 37.5 — ABNORMAL LOW
Hemoglobin: 12.5 — ABNORMAL LOW
Hemoglobin: 12.8 — ABNORMAL LOW
MCHC: 34.1
MCHC: 34.1
MCHC: 34.2
MCV: 91.2
MCV: 91.2
MCV: 91.7
MCV: 91.9
MCV: 92.6
Platelets: 341
Platelets: 348
Platelets: 362
Platelets: 384
Platelets: 388
Platelets: 401 — ABNORMAL HIGH
RBC: 3.51 — ABNORMAL LOW
RBC: 4 — ABNORMAL LOW
RBC: 4.09 — ABNORMAL LOW
RBC: 4.15 — ABNORMAL LOW
RDW: 13
RDW: 13.2
RDW: 13.4
WBC: 11.1 — ABNORMAL HIGH
WBC: 13 — ABNORMAL HIGH
WBC: 13.7 — ABNORMAL HIGH
WBC: 14.5 — ABNORMAL HIGH
WBC: 14.7 — ABNORMAL HIGH
WBC: 15.5 — ABNORMAL HIGH
WBC: 17.5 — ABNORMAL HIGH

## 2011-01-05 LAB — PHOSPHORUS: Phosphorus: 5 — ABNORMAL HIGH

## 2011-01-05 LAB — IRON AND TIBC
Iron: 40 — ABNORMAL LOW
TIBC: 236

## 2011-01-05 LAB — FERRITIN: Ferritin: 71 (ref 22–322)

## 2011-01-05 LAB — POCT I-STAT 3, ART BLOOD GAS (G3+)
Acid-Base Excess: 4 — ABNORMAL HIGH
Bicarbonate: 25.3 — ABNORMAL HIGH
Operator id: 209401
Patient temperature: 98
pH, Arterial: 7.587 — ABNORMAL HIGH

## 2011-01-05 LAB — VITAMIN B12: Vitamin B-12: 369 (ref 211–911)

## 2011-01-05 LAB — CULTURE, RESPIRATORY W GRAM STAIN

## 2011-01-05 LAB — MAGNESIUM: Magnesium: 1.8

## 2011-01-05 LAB — RETICULOCYTES
RBC.: 3.52 — ABNORMAL LOW
Retic Count, Absolute: 102.1

## 2011-01-05 LAB — THEOPHYLLINE LEVEL: Theophylline Lvl: 0.8 — ABNORMAL LOW

## 2011-01-06 LAB — POCT I-STAT 3, ART BLOOD GAS (G3+)
Acid-Base Excess: 2
Acid-Base Excess: 3 — ABNORMAL HIGH
Acid-Base Excess: 7 — ABNORMAL HIGH
Bicarbonate: 22.9
Bicarbonate: 23.2
Bicarbonate: 27.3 — ABNORMAL HIGH
Bicarbonate: 31.8 — ABNORMAL HIGH
O2 Saturation: 90
O2 Saturation: 93
O2 Saturation: 98
O2 Saturation: 98
Operator id: 270211
Operator id: 270221
Operator id: 271091
Patient temperature: 97.9
Patient temperature: 98.7
TCO2: 24
TCO2: 24
TCO2: 29
TCO2: 29
TCO2: 33
pCO2 arterial: 27.6 — ABNORMAL LOW
pCO2 arterial: 30.4 — ABNORMAL LOW
pCO2 arterial: 38.2
pCO2 arterial: 40.1
pCO2 arterial: 44.3
pH, Arterial: 7.4
pH, Arterial: 7.447
pH, Arterial: 7.481 — ABNORMAL HIGH
pH, Arterial: 7.506 — ABNORMAL HIGH
pH, Arterial: 7.533 — ABNORMAL HIGH
pH, Arterial: 7.575 — ABNORMAL HIGH
pO2, Arterial: 104 — ABNORMAL HIGH
pO2, Arterial: 108 — ABNORMAL HIGH
pO2, Arterial: 147 — ABNORMAL HIGH
pO2, Arterial: 51 — ABNORMAL LOW
pO2, Arterial: 58 — ABNORMAL LOW
pO2, Arterial: 90

## 2011-01-06 LAB — TYPE AND SCREEN
ABO/RH(D): B POS
Antibody Screen: NEGATIVE

## 2011-01-06 LAB — BLOOD GAS, ARTERIAL
Acid-base deficit: 0.4
Bicarbonate: 23.2
Drawn by: 206361
O2 Saturation: 92.5
pO2, Arterial: 60.1 — ABNORMAL LOW

## 2011-01-06 LAB — URINALYSIS, ROUTINE W REFLEX MICROSCOPIC
Bilirubin Urine: NEGATIVE
Bilirubin Urine: NEGATIVE
Glucose, UA: NEGATIVE
Hgb urine dipstick: NEGATIVE
Hgb urine dipstick: NEGATIVE
Ketones, ur: 15 — AB
Nitrite: NEGATIVE
Protein, ur: NEGATIVE
Specific Gravity, Urine: 1.02
Specific Gravity, Urine: 1.026
Urobilinogen, UA: 1
Urobilinogen, UA: 1
pH: 5

## 2011-01-06 LAB — BASIC METABOLIC PANEL
BUN: 13
BUN: 16
CO2: 28
Calcium: 8.3 — ABNORMAL LOW
Calcium: 8.6
Calcium: 8.9
Chloride: 98
Creatinine, Ser: 0.84
Creatinine, Ser: 0.99
Creatinine, Ser: 0.99
GFR calc Af Amer: >60
GFR calc Af Amer: >60
GFR calc non Af Amer: >60
Glucose, Bld: 126 — ABNORMAL HIGH
Glucose, Bld: 126 — ABNORMAL HIGH
Sodium: 139

## 2011-01-06 LAB — CBC
Hemoglobin: 12.5 — ABNORMAL LOW
Hemoglobin: 15.7
MCHC: 34.3
MCHC: 34.4
MCHC: 34.5
MCHC: 34.7
MCV: 89.6
MCV: 90.3
MCV: 91.4
Platelets: 270
Platelets: 289
Platelets: 297
Platelets: 337
RBC: 3.14 — ABNORMAL LOW
RBC: 3.87 — ABNORMAL LOW
RBC: 3.88 — ABNORMAL LOW
RBC: 5.1
RDW: 12.9
RDW: 13
RDW: 13.1
RDW: 13.1
WBC: 10.1
WBC: 13.4 — ABNORMAL HIGH
WBC: 15.3 — ABNORMAL HIGH
WBC: 21.2 — ABNORMAL HIGH
WBC: 7.4

## 2011-01-06 LAB — CULTURE, RESPIRATORY W GRAM STAIN: Gram Stain: NONE SEEN

## 2011-01-06 LAB — COMPREHENSIVE METABOLIC PANEL
ALT: 18
ALT: 33
ALT: 43
AST: 18
AST: 21
AST: 49 — ABNORMAL HIGH
Albumin: 2.6 — ABNORMAL LOW
Albumin: 3 — ABNORMAL LOW
Albumin: 4.1
Alkaline Phosphatase: 38 — ABNORMAL LOW
Alkaline Phosphatase: 54
Alkaline Phosphatase: 61
BUN: 17
BUN: 20
CO2: 21
CO2: 27
Calcium: 8.7
Chloride: 104
Chloride: 94 — ABNORMAL LOW
Creatinine, Ser: 0.89
GFR calc Af Amer: >60
GFR calc Af Amer: >60
GFR calc non Af Amer: >60
Glucose, Bld: 112 — ABNORMAL HIGH
Potassium: 3.5
Potassium: 4.1
Potassium: 4.2
Sodium: 129 — ABNORMAL LOW
Sodium: 132 — ABNORMAL LOW
Total Bilirubin: 1.1
Total Bilirubin: 1.2
Total Bilirubin: 1.3 — ABNORMAL HIGH
Total Protein: 5.1 — ABNORMAL LOW
Total Protein: 6
Total Protein: 6.9

## 2011-01-06 LAB — DIFFERENTIAL
Basophils Absolute: 0
Eosinophils Relative: 0
Lymphocytes Relative: 5 — ABNORMAL LOW
Monocytes Absolute: 0.4
Monocytes Relative: 4
Neutro Abs: 9.2 — ABNORMAL HIGH

## 2011-01-06 LAB — HEMOGLOBIN AND HEMATOCRIT, BLOOD
HCT: 28.7 — ABNORMAL LOW
HCT: 28.9 — ABNORMAL LOW
HCT: 29.2 — ABNORMAL LOW
Hemoglobin: 9.9 — ABNORMAL LOW
Hemoglobin: 9.9 — ABNORMAL LOW

## 2011-01-06 LAB — CULTURE, BLOOD (ROUTINE X 2): Culture: NO GROWTH

## 2011-01-06 LAB — FUNGUS CULTURE W SMEAR

## 2011-01-06 LAB — CULTURE, BAL-QUANTITATIVE W GRAM STAIN

## 2011-01-06 LAB — TISSUE CULTURE

## 2011-01-06 LAB — URINE CULTURE
Colony Count: NO GROWTH
Culture: NO GROWTH
Special Requests: NEGATIVE

## 2011-01-06 LAB — PROTIME-INR: INR: 1.1

## 2011-01-06 LAB — AFB CULTURE WITH SMEAR (NOT AT ARMC)

## 2011-01-06 LAB — ABO/RH: ABO/RH(D): B POS

## 2011-01-06 LAB — EXPECTORATED SPUTUM ASSESSMENT W GRAM STAIN, RFLX TO RESP C

## 2011-04-06 ENCOUNTER — Ambulatory Visit: Payer: Medicare Other | Admitting: Pulmonary Disease

## 2011-04-06 ENCOUNTER — Ambulatory Visit (INDEPENDENT_AMBULATORY_CARE_PROVIDER_SITE_OTHER): Payer: Medicare Other | Admitting: Pulmonary Disease

## 2011-04-06 ENCOUNTER — Encounter: Payer: Self-pay | Admitting: Pulmonary Disease

## 2011-04-06 DIAGNOSIS — J9610 Chronic respiratory failure, unspecified whether with hypoxia or hypercapnia: Secondary | ICD-10-CM

## 2011-04-06 DIAGNOSIS — J438 Other emphysema: Secondary | ICD-10-CM

## 2011-04-06 NOTE — Progress Notes (Signed)
  Subjective:    Patient ID: [name], male    DOB: 12/10/1931, 78 y.o.   MRN: [id number]  HPI Patient comes in today for follow up of his significant emphysema.  He is wearing oxygen compliantly, and staying on his bronchodilator regimen.  He has not had a recent chest cold or COPD exacerbation.  He feels that his exertional tolerance may be a tiny bit worse from last visit, but not overly significant.  He really has not been staying very active because of the cold weather.  He denies any chest congestion or cough.   Review of Systems  Constitutional: Negative for fever and unexpected weight change.  HENT: Positive for rhinorrhea. Negative for ear pain, nosebleeds, congestion, sore throat, sneezing, trouble swallowing, dental problem, postnasal drip and sinus pressure.   Eyes: Negative for redness and itching.  Respiratory: Positive for shortness of breath. Negative for cough, chest tightness and wheezing.   Cardiovascular: Negative for palpitations and leg swelling.  Gastrointestinal: Negative for nausea and vomiting.  Genitourinary: Negative for dysuria.  Musculoskeletal: Negative for joint swelling.  Skin: Negative for rash.  Neurological: Negative for headaches.  Hematological: Does not bruise/bleed easily.  Psychiatric/Behavioral: Negative for dysphoric mood. The patient is not nervous/anxious.        Objective:   Physical Exam Overweight male in no acute distress Nose without purulence or discharge noted Chest with decreased breath sounds, but no wheezes or rhonchi Cardiac exam with regular rate and rhythm Lower extremities with no significant edema, no cyanosis Alert and oriented, moves all 4 extremities.       Assessment & Plan:

## 2011-04-06 NOTE — Assessment & Plan Note (Signed)
The pt is really maintaining a fairly stable baseline on his current meds.  I have stressed to him the importance of increasing his activity and working on weight loss.  He is to followup with me in 6mos if doing well.

## 2011-04-06 NOTE — Patient Instructions (Signed)
No change in medications Stay as active as possible and work on weight loss followup with me in 6mos.

## 2011-07-10 ENCOUNTER — Encounter (HOSPITAL_COMMUNITY): Payer: Self-pay | Admitting: Pharmacy Technician

## 2011-07-21 ENCOUNTER — Ambulatory Visit (HOSPITAL_COMMUNITY)
Admission: RE | Admit: 2011-07-21 | Discharge: 2011-07-21 | Disposition: A | Discharge status: Home / Self Care | Payer: Medicare Other | Source: Ambulatory Visit | Attending: Cardiology | Admitting: Cardiology

## 2011-07-21 ENCOUNTER — Encounter (HOSPITAL_COMMUNITY)
Admission: RE | Disposition: A | Discharge status: Home / Self Care | Payer: Self-pay | Source: Ambulatory Visit | Attending: Cardiology

## 2011-07-21 ENCOUNTER — Ambulatory Visit (HOSPITAL_COMMUNITY): Payer: Medicare Other

## 2011-07-21 DIAGNOSIS — I739 Peripheral vascular disease, unspecified: Secondary | ICD-10-CM | POA: Insufficient documentation

## 2011-07-21 DIAGNOSIS — I2510 Atherosclerotic heart disease of native coronary artery without angina pectoris: Secondary | ICD-10-CM | POA: Insufficient documentation

## 2011-07-21 DIAGNOSIS — I771 Stricture of artery: Secondary | ICD-10-CM | POA: Insufficient documentation

## 2011-07-21 HISTORY — PX: ARCH AORTOGRAM: SHX5501

## 2011-07-21 LAB — POCT I-STAT, CHEM 8
BUN: 17 mg/dL (ref 6–23)
Creatinine, Ser: 1.2 mg/dL (ref 0.50–1.35)
Hemoglobin: 14.3 g/dL (ref 13.0–17.0)
Potassium: 3.9 mEq/L (ref 3.5–5.1)
Sodium: 141 mEq/L (ref 135–145)

## 2011-07-21 SURGERY — ARCH AORTOGRAM
Anesthesia: LOCAL

## 2011-07-21 MED ORDER — ONDANSETRON HCL 4 MG/2ML IJ SOLN: 4.0000 mg | Freq: Four times a day (QID) | INTRAMUSCULAR | Status: DC | PRN

## 2011-07-21 MED ORDER — ACETAMINOPHEN 325 MG PO TABS: 650.0000 mg | ORAL_TABLET | ORAL | Status: DC | PRN

## 2011-07-21 MED ORDER — LIDOCAINE HCL (PF) 1 % IJ SOLN
INTRAMUSCULAR | Status: AC
  Filled 2011-07-21: qty 30

## 2011-07-21 MED ORDER — HEPARIN (PORCINE) IN NACL 2-0.9 UNIT/ML-% IJ SOLN
INTRAMUSCULAR | Status: AC
  Filled 2011-07-21: qty 2000

## 2011-07-21 MED ORDER — MIDAZOLAM HCL 2 MG/2ML IJ SOLN
INTRAMUSCULAR | Status: AC
  Filled 2011-07-21: qty 2

## 2011-07-21 MED ORDER — SODIUM CHLORIDE 0.9 % IV SOLN: 1.0000 mL/kg/h | INTRAVENOUS | Status: DC

## 2011-07-21 MED ORDER — SODIUM CHLORIDE 0.9 % IV SOLN
INTRAVENOUS | Status: DC
  Administered 2011-07-21: 1000 mL via INTRAVENOUS

## 2011-07-21 MED ORDER — FENTANYL CITRATE 0.05 MG/ML IJ SOLN
INTRAMUSCULAR | Status: AC
  Filled 2011-07-21: qty 2

## 2011-07-21 NOTE — Discharge Instructions (Signed)

## 2011-07-21 NOTE — Interval H&P Note (Signed)
History and Physical Interval Note:  07/21/2011 7:42 AM  [name]  has presented today for surgery, with the diagnosis of PVD  The various methods of treatment have been discussed with the patient and family. After consideration of risks, benefits and other options for treatment, the patient has consented to  Procedure(s) (LRB): ARCH AORTOGRAM (N/A) as a surgical intervention .  The patients' history has been reviewed, patient examined, no change in status, stable for surgery.  I have reviewed the patients' chart and labs.  Questions were answered to the patient's satisfaction.     Pamella Pert

## 2011-07-21 NOTE — CV Procedure (Signed)
Procedure performed: Arch aortogram, selective right and left subclavian arteriogram, right innonimate arteriogram.  Indication: Patient is a 78 year old woman with known coronary artery disease and peripheral arterial disease. He is known right subclavian artery stenoses. He's been having worsening symptoms of subclavian steal with marked gait instability. Hence he is brought to the peripheral angiography suite to evaluate his subclavian anatomy consider the revascularization strategy.  Angiographic data: Arch aortogram : Type II arch. The right inonimate artery is very large and gives origin cancer a large carotid artery and subclavian artery. There is a heavily calcified, very complex high-grade stenosis that is evident at the origin of the subclavian artery and encompasses the origin of a very large and dominant right vertebral artery. The stenosis extends beyond vertebral artery. The internal carotid artery of looks very clean without any significant stenoses.  Left subclavian artery: The lungs are clear heart is widely patent. The left vertebral artery is very small, nondominant and probably occluded in the proximal segment.    Recommendation: I have evaluated the angiograms along with Dr. Durene Cal, and he'll be seeing the patient in the office and consider surgical graft position off the right subclavian stenoses. He will look into evaluation of the vertebral artery revascularization at the same time.  Technique: Under sterile precautions using a 5 French right femoral arterial access, a Simmons 1 catheter was advanced into the arch of the aorta after having performed arch aortogram with pigtail catheter. selective right innominate arteriogram and subclavian arteriogram was performed. The catheter was then exchanged over a long exchange wire to a Bernstein 1 catheter and angiography was repeated. The same cath was utilized to engage the left subdural artery and left subclavian arteriogram  was performed. The cath was then pulled out of the body over a versacore wire. Patient of the procedure well. There was no immediate complication.

## 2011-07-21 NOTE — H&P (Signed)
  Please see paper chart for complete  History and physical.

## 2011-07-24 ENCOUNTER — Telehealth: Payer: Self-pay | Admitting: Surgery

## 2011-07-24 NOTE — Telephone Encounter (Signed)
Message copied by Fredrich Birks on Fri Jul 24, 2011  3:25 PM ------      Message from: Melene Plan      Created: Fri Jul 24, 2011  2:10 PM                   ----- Message -----         From: Nada Libman, MD         Sent: 07/23/2011  10:18 PM           To: Melene Plan, RN            Dr Nadara Eaton asked me to have this patient seen in the office as a NEW patient for right subclavian stenosis.  Please schedule in the next 1-2 weeks                  Also, Dr Allyson Sabal is referring to me as a NW pateint Elizebeth Brooking.  Have her seen in the next 1-2 weeks.

## 2011-07-24 NOTE — Telephone Encounter (Signed)
Patient notified of appointment via telephone call. Mailed letter to home address also regarding scheduled appointment.   

## 2011-08-07 ENCOUNTER — Encounter: Payer: Self-pay | Admitting: Surgery

## 2011-08-10 ENCOUNTER — Encounter: Payer: Self-pay | Admitting: Surgery

## 2011-08-10 ENCOUNTER — Ambulatory Visit (INDEPENDENT_AMBULATORY_CARE_PROVIDER_SITE_OTHER): Payer: Medicare Other | Admitting: Surgery

## 2011-08-10 ENCOUNTER — Other Ambulatory Visit: Payer: Medicare Other

## 2011-08-10 VITALS — BP 112/67 | HR 78 | Temp 97.8°F | Ht 67.0 in | Wt 177.0 lb

## 2011-08-10 DIAGNOSIS — I771 Stricture of artery: Secondary | ICD-10-CM

## 2011-08-10 NOTE — Progress Notes (Signed)
[facility]   Patient name: [name] MRN: [id number] DOB: 30-May-1931 Sex: male   Referred by: Dr. Nadara Eaton  Reason for referral:  Chief Complaint  Patient presents with  . Sub-clavian    New pt subclavian stenosis- Dr. Nadara Eaton    HISTORY OF PRESENT ILLNESS: In today for further evaluation of balance problems. She has recently undergone cardiac and arch angiography by Dr.Gangi. This revealed an atretic left vertebral artery with a dominant right vertebral artery however there was a high-grade stenosis at the origin. The patient states that he has had a very difficult time with balance for the past 20+ years. He feels that he will surround as if he is drunk. He is afraid to wall I his [place] at home because he may fall into it. He has multiple bruises on his arms and legs for frequent falls. He does not endorse any right upper extremity symptoms. He denies rest pain or claudication like symptoms. Occasionally the right arm will get tired with excessive activity but this is not very common.  The patient is on 3 L of continuous oxygen secondary to emphysematous changes and a history of lung resection which turned out to be negative for malignancy. He is also status post coronary artery bypass grafting for coronary artery disease. He did not have any chest pain at this time. He is taking Plavix. He also suffers from cardiomyopathy. He has a history of a DVT.  Past Medical History  Diagnosis Date  . Pulmonary nodule     s/p wedge resction, inflammatory, +MAC  . Emphysema   . Myocardial infarction   . Cardiomyopathy     EF 25% by echo 2009  . COPD (chronic obstructive pulmonary disease)   . CAD (coronary artery disease)   . DVT (deep venous thrombosis)     Past Surgical History  Procedure Date  . Carotid endarterectomy 2009    triple bypass  . Lung surgery 2008    tumor    History   Social History  . Marital Status: Divorced    Spouse Name: N/A   Number of Children: N/A  . Years of Education: N/A   Occupational History  . Retired     Designer, fashion/clothing   Social History Main Topics  . Smoking status: Former Smoker -- 2.0 packs/day for 56 years    Types: Cigarettes    Quit date: 03/30/2006  . Smokeless tobacco: Not on file  . Alcohol Use: No     occ  . Drug Use: No  . Sexually Active: Not on file   Other Topics Concern  . Not on file   Social History Narrative  . No narrative on file    Family History  Problem Relation Age of Onset  . Heart disease Father   . Diabetes Sister     Allergies as of 08/10/2011 - Review Complete 08/10/2011  Allergen Reaction Noted  . Tetanus toxoids Other (See Comments) 04/06/2011    Current Outpatient Prescriptions on File Prior to Visit  Medication Sig Dispense Refill  . albuterol (PROVENTIL) (2.5 MG/3ML) 0.083% nebulizer solution Take 2.5 mg by nebulization 4 (four) times daily as needed. For shortness of breath      . aspirin 81 MG chewable tablet Chew 81 mg by mouth daily.      [name] atorvastatin (LIPITOR) 40 MG tablet Take 40 mg by mouth daily.      . cetirizine (ZYRTEC) 10 MG tablet Take 10 mg by mouth daily  as needed. For runny nose      . cilostazol (PLETAL) 100 MG tablet Take 100 mg by mouth 2 (two) times daily.       . clopidogrel (PLAVIX) 75 MG tablet Take 75 mg by mouth daily.       . Fluticasone-Salmeterol (ADVAIR) 250-50 MCG/DOSE AEPB Inhale 1 puff into the lungs every 12 (twelve) hours.      . folic acid (FOLVITE) 1 MG tablet Take 1 mg by mouth daily.       . furosemide (LASIX) 40 MG tablet Take 40 mg by mouth daily.       . metoprolol succinate (TOPROL-XL) 25 MG 24 hr tablet Take 25 mg by mouth daily.      . vitamin B-12 (CYANOCOBALAMIN) 1000 MCG tablet Take 1,000 mcg by mouth daily.      [name] albuterol (PROAIR HFA) 108 (90 BASE) MCG/ACT inhaler Inhale 2 puffs into the lungs every 6 (six) hours as needed. For shortness of breath         REVIEW OF SYSTEMS: Positive for shortness of  breath with exertion and when lying flat. Positive for history of a DVT. Positive for varicose veins. Positive for weakness in his arms and legs. Positive for dizziness. All other review of systems are negative as documented by the patient and the encounter form.  PHYSICAL EXAMINATION: General: The patient appears their stated age.  Vital signs are BP 112/67  Pulse 78  Temp(Src) 97.8 F (36.6 C) (Oral)  Ht 5\' 7"  (1.702 m)  Wt 177 lb (80.287 kg)  BMI 27.72 kg/m2 HEENT:  No gross abnormalities Pulmonary: Respirations are non-labored Abdomen: Soft and non-tender  Musculoskeletal: There are no major deformities.   Neurologic: No focal weakness or paresthesias are detected, Skin: There are no ulcer or rashes noted. Psychiatric: The patient has normal affect. Cardiovascular: There is a regular rate and rhythm without significant murmur appreciated. No carotid bruits. Palpable left radial pulse, nonpalpable right brachial and radial pulse.  Diagnostic Studies: None today  Outside Studies/Documentation Historical records were reviewed.  They showed stenosis at the origin of the right vertebral artery.  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 With an occluded left vertebral artery and a dominant right vertebral artery with a high-grade stenosis at its origin, this could potentially explain his symptoms. He does not endorse significant right upper extremity problems. After reviewing his films I believe that he is a candidate for transposition of his right vertebral artery. I more than likely will not revascularize his right arm at the same time given his lack of symptoms. I had an extensive discussion with the patient regarding the operation. We discussed the risks which include but are not limited to worsening pulmonary complications secondary  to phrenic nerve injury, thoracic duct injury with lymphatic leak, the possibility of stroke, as well as the possibility that revascularization may not improve his symptoms. At this point the patient wishes to proceed. I will give formal pulmonary clearance from Dr. Shelle Iron. His operation is been scheduled for Thursday, May 23. I will stop his Plavix 5 days prior.     Jorge Ny, M.D. [facility] [place] Office: [phone] Pager:  [phone]

## 2011-08-11 ENCOUNTER — Other Ambulatory Visit: Payer: Self-pay | Admitting: *Deleted

## 2011-08-11 ENCOUNTER — Ambulatory Visit (INDEPENDENT_AMBULATORY_CARE_PROVIDER_SITE_OTHER): Payer: Medicare Other | Admitting: Pulmonary Disease

## 2011-08-11 ENCOUNTER — Encounter: Payer: Self-pay | Admitting: Pulmonary Disease

## 2011-08-11 VITALS — BP 124/76 | HR 93 | Temp 97.6°F | Ht 67.0 in | Wt 176.6 lb

## 2011-08-11 DIAGNOSIS — J438 Other emphysema: Secondary | ICD-10-CM

## 2011-08-11 DIAGNOSIS — J9610 Chronic respiratory failure, unspecified whether with hypoxia or hypercapnia: Secondary | ICD-10-CM

## 2011-08-11 NOTE — Patient Instructions (Signed)
Will send a note to your vascular surgeon No change in medications.

## 2011-08-11 NOTE — Assessment & Plan Note (Signed)
The patient is stable from a COPD standpoint currently, with no evidence for a chest infection or acute exacerbation.  Regarding his pulmonary risk for upcoming vascular surgery, I think he is at least a moderate risk for postop pulmonary complications such as pneumonia or ventilator dependence.  In his favor, the patient has been through quite a bit over the years, and has always done fairly well in the end.  The pulmonary service will be available for assistance in the postoperative period.

## 2011-08-11 NOTE — Progress Notes (Signed)
  Subjective:    Patient ID: [name], male    DOB: 1931/07/26, 78 y.o.   MRN: [id number]  HPI The patient comes in today for preop pulmonary clearance.  He has known severe airflow obstruction with chronic oxygen dependent respiratory failure, and is scheduled for vascular surgery.  The patient feels that he is at his usual baseline, and has not had any recent symptoms to suggest a developing pulmonary infection.  He feels that he is doing well overall.  I have had a long discussion with him regarding his upcoming surgery and lung disease, and the potential complications of pneumonia and prolonged ventilator dependence.   Review of Systems  Constitutional: Negative.  Negative for fever and unexpected weight change.  HENT: Negative.  Negative for ear pain, nosebleeds, congestion, sore throat, rhinorrhea, sneezing, trouble swallowing, dental problem, postnasal drip and sinus pressure.   Eyes: Negative.  Negative for redness and itching.  Respiratory: Positive for shortness of breath. Negative for cough, chest tightness and wheezing.   Cardiovascular: Negative.  Negative for palpitations and leg swelling.  Gastrointestinal: Negative.  Negative for nausea and vomiting.  Genitourinary: Negative.  Negative for dysuria.  Musculoskeletal: Negative.  Negative for joint swelling.  Skin: Negative.  Negative for rash.  Neurological: Negative.  Negative for headaches.  Hematological: Negative.  Does not bruise/bleed easily.  Psychiatric/Behavioral: Negative.  Negative for dysphoric mood. The patient is not nervous/anxious.        Objective:   Physical Exam Well-developed male in no acute distress Nose without purulence or discharge noted Chest with mild decrease in breath sounds, but no wheezes or rhonchi Cardiac exam with regular rate and rhythm Lower extremities with minimal edema, no cyanosis Alert and oriented, moves all 4 extremities.       Assessment & Plan:

## 2011-08-17 ENCOUNTER — Inpatient Hospital Stay (HOSPITAL_COMMUNITY): Admission: RE | Admit: 2011-08-17 | Discharge: 2011-08-17 | Payer: Medicare Other | Source: Ambulatory Visit

## 2011-08-17 ENCOUNTER — Encounter (HOSPITAL_COMMUNITY): Payer: Self-pay

## 2011-08-17 HISTORY — DX: Peripheral vascular disease, unspecified: I73.9

## 2011-08-17 HISTORY — DX: Essential (primary) hypertension: I10

## 2011-08-17 HISTORY — DX: Anemia, unspecified: D64.9

## 2011-08-17 HISTORY — DX: Deficiency of other specified B group vitamins: E53.8

## 2011-08-17 NOTE — Pre-Procedure Instructions (Signed)
20 [name]  08/17/2011   Your procedure is scheduled on:  Thursday, May 23  Report to [facility] Short Stay Center at *0530** AM.  Call this number if you have problems the morning of surgery: [phone]   Remember:   Do not eat food:After Midnight.  May have clear liquids: up to 4 Hours before arrival.(1:30 am)  Clear liquids include soda, tea, black coffee, apple or grape juice, broth.  Take these medicines the morning of surgery with A SIP OF WATER: Metoprolol,Advair,inhalers   Do not wear jewelry, make-up or nail polish.  Do not wear lotions, powders, or perfumes. You may wear deodorant.  Do not shave 48 hours prior to surgery. Men may shave face and neck.  Do not bring valuables to the hospital.  Contacts, dentures or bridgework may not be worn into surgery.  Leave suitcase in the car. After surgery it may be brought to your room.  For patients admitted to the hospital, checkout time is 11:00 AM the day of discharge.   Patients discharged the day of surgery will not be allowed to drive home.  Name and phone number of your driver: n/a  Special Instructions: CHG Shower Use Special Wash: 1/2 bottle night before surgery and 1/2 bottle morning of surgery.   Please read over the following fact sheets that you were given: Pain Booklet, Coughing and Deep Breathing, Blood Transfusion Information, MRSA Information and Surgical Site Infection Prevention

## 2011-08-18 ENCOUNTER — Encounter (HOSPITAL_COMMUNITY)
Admission: RE | Admit: 2011-08-18 | Discharge: 2011-08-18 | Disposition: A | Discharge status: Home / Self Care | Payer: Medicare Other | Source: Ambulatory Visit | Attending: Surgery | Admitting: Surgery

## 2011-08-18 LAB — CBC
HCT: 42.5 % (ref 39.0–52.0)
Hemoglobin: 14.6 g/dL (ref 13.0–17.0)
MCH: 29.6 pg (ref 26.0–34.0)
RBC: 4.94 MIL/uL (ref 4.22–5.81)

## 2011-08-18 LAB — URINALYSIS, ROUTINE W REFLEX MICROSCOPIC
Bilirubin Urine: NEGATIVE
Hgb urine dipstick: NEGATIVE
Specific Gravity, Urine: 1.023 (ref 1.005–1.030)
pH: 6.5 (ref 5.0–8.0)

## 2011-08-18 LAB — COMPREHENSIVE METABOLIC PANEL
ALT: 18 U/L (ref 0–53)
Alkaline Phosphatase: 52 U/L (ref 39–117)
BUN: 18 mg/dL (ref 6–23)
CO2: 27 mEq/L (ref 19–32)
Calcium: 9.6 mg/dL (ref 8.4–10.5)
GFR calc Af Amer: 69 mL/min — ABNORMAL LOW (ref >90)
GFR calc non Af Amer: 60 mL/min — ABNORMAL LOW (ref >90)
Glucose, Bld: 107 mg/dL — ABNORMAL HIGH (ref 70–99)
Sodium: 138 mEq/L (ref 135–145)

## 2011-08-18 LAB — SURGICAL PCR SCREEN: Staphylococcus aureus: POSITIVE — AB

## 2011-08-18 LAB — PROTIME-INR: Prothrombin Time: 14.9 seconds (ref 11.6–15.2)

## 2011-08-18 LAB — APTT: aPTT: 28 seconds (ref 24–37)

## 2011-08-18 NOTE — Pre-Procedure Instructions (Signed)
[address]  08/18/2011   Your procedure is scheduled on:  Thurs, May 23 @ 7:30 AM  Report to [facility] at 5:30 AM.  Call this number if you have problems the morning of surgery: [phone]   Remember:   Do not eat food:After Midnight.  May have clear liquids: up to 4 Hours before arrival.(until 1:30 AM)  Clear liquids include soda, tea, black coffee, apple or grape juice, broth,water  Take these medicines the morning of surgery with A SIP OF WATER: Albuterol<Bring Your Inhaler With You>,Zyrtec,Advair,and Metoprolol   Do not wear jewelry  Do not wear lotions, powders, or cologne   Men may shave face and neck.  Do not bring valuables to the hospital.  Contacts, dentures or bridgework may not be worn into surgery.  Leave suitcase in the car. After surgery it may be brought to your room.  For patients admitted to the hospital, checkout time is 11:00 AM the day of discharge.   Special Instructions: CHG Shower Use Special Wash: 1/2 bottle night before surgery and 1/2 bottle morning of surgery.   Please read over the following fact sheets that you were given: Pain Booklet, Coughing and Deep Breathing, Blood Transfusion Information, MRSA Information and Surgical Site Infection Prevention

## 2011-08-19 MED ORDER — DEXTROSE 5 % IV SOLN
1.5000 g | INTRAVENOUS | Status: DC
  Filled 2011-08-19: qty 1.5

## 2011-08-19 MED ORDER — SODIUM CHLORIDE 0.9 % IV SOLN: INTRAVENOUS | Status: DC

## 2011-08-19 NOTE — Consult Note (Signed)
Anesthesia Chart Review:  Patient is a 78 year old male scheduled for right carotid vertebral transposition for subclavian stenosis on 08/20/11.  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  PCP is Dr. Aida Puffer.  [name] who cleared him with moderate pulmonary risk.  His Cardiologist is Dr. Jacinto Halim who referred him for surgical intervention.  His EKG on 07/02/11 showed NSR, IVCD, LVH, poor r wave progression, cannot rule out anterior infarct (old).  Dr. Jacinto Halim felt it was stable since at least 11/13/10.    Echo on 12/15/10 showed mild LVH, mild LV global hypokinesis, lower limits systolic function with visual EF 45-50%, moderate aortic valve leaflet thickening with mild calcification, no AS, moderate calcification of the mitral annulus, mild MR.    By notes, he has a stress test on 04/11/10 that showed inferior scar with mild ischemia, EF 49%, which was felt unchanged from 2010 and a low risk study.  (Report requested.)  He has a known occluded RCA.  Labs acceptable.  CXR on 07/21/11 showed: Emphysematous changes and scattered fibrosis in the lungs. No evidence of active pulmonary disease. No change since prior studies.   Plan to proceed.  Shonna Chock, PA-C

## 2011-08-20 ENCOUNTER — Inpatient Hospital Stay (HOSPITAL_COMMUNITY)
Admission: RE | Admit: 2011-08-20 | Discharge: 2011-08-21 | DRG: 027 | Disposition: A | Discharge status: Home / Self Care | Payer: Medicare Other | Source: Ambulatory Visit | Attending: Surgery | Admitting: Surgery

## 2011-08-20 ENCOUNTER — Encounter (HOSPITAL_COMMUNITY)
Admission: RE | Disposition: A | Discharge status: Home / Self Care | Payer: Self-pay | Source: Ambulatory Visit | Attending: Surgery

## 2011-08-20 ENCOUNTER — Telehealth: Payer: Self-pay | Admitting: Surgery

## 2011-08-20 ENCOUNTER — Encounter (HOSPITAL_COMMUNITY): Payer: Self-pay | Admitting: Anesthesiology

## 2011-08-20 ENCOUNTER — Encounter (HOSPITAL_COMMUNITY): Payer: Self-pay | Admitting: *Deleted

## 2011-08-20 ENCOUNTER — Ambulatory Visit (HOSPITAL_COMMUNITY): Payer: Medicare Other | Admitting: Anesthesiology

## 2011-08-20 DIAGNOSIS — J4489 Other specified chronic obstructive pulmonary disease: Secondary | ICD-10-CM | POA: Diagnosis present

## 2011-08-20 DIAGNOSIS — Z951 Presence of aortocoronary bypass graft: Secondary | ICD-10-CM

## 2011-08-20 DIAGNOSIS — J449 Chronic obstructive pulmonary disease, unspecified: Secondary | ICD-10-CM | POA: Diagnosis present

## 2011-08-20 DIAGNOSIS — I252 Old myocardial infarction: Secondary | ICD-10-CM

## 2011-08-20 DIAGNOSIS — I6509 Occlusion and stenosis of unspecified vertebral artery: Principal | ICD-10-CM | POA: Diagnosis present

## 2011-08-20 DIAGNOSIS — Z9981 Dependence on supplemental oxygen: Secondary | ICD-10-CM

## 2011-08-20 DIAGNOSIS — I708 Atherosclerosis of other arteries: Secondary | ICD-10-CM

## 2011-08-20 DIAGNOSIS — I771 Stricture of artery: Secondary | ICD-10-CM

## 2011-08-20 DIAGNOSIS — I2510 Atherosclerotic heart disease of native coronary artery without angina pectoris: Secondary | ICD-10-CM | POA: Diagnosis present

## 2011-08-20 DIAGNOSIS — I10 Essential (primary) hypertension: Secondary | ICD-10-CM | POA: Diagnosis present

## 2011-08-20 HISTORY — PX: CAROTID-VERTEBRAL BYPASS GRAFT: SHX5164

## 2011-08-20 SURGERY — CREATION, BYPASS, ARTERIAL, CAROTID TO VERTEBRAL, USING GRAFT
Anesthesia: General | Site: Neck | Laterality: Right | Wound class: Clean

## 2011-08-20 MED ORDER — ALBUTEROL SULFATE HFA 108 (90 BASE) MCG/ACT IN AERS: 2.0000 | INHALATION_SPRAY | Freq: Four times a day (QID) | RESPIRATORY_TRACT | Status: DC | PRN

## 2011-08-20 MED ORDER — ALBUTEROL SULFATE (5 MG/ML) 0.5% IN NEBU: 2.5000 mg | INHALATION_SOLUTION | Freq: Four times a day (QID) | RESPIRATORY_TRACT | Status: DC | PRN

## 2011-08-20 MED ORDER — PHENYLEPHRINE HCL 10 MG/ML IJ SOLN: INTRAMUSCULAR | Status: DC | PRN

## 2011-08-20 MED ORDER — ONDANSETRON HCL 4 MG/2ML IJ SOLN
INTRAMUSCULAR | Status: DC | PRN
  Administered 2011-08-20: 4 mg via INTRAVENOUS

## 2011-08-20 MED ORDER — ACETAMINOPHEN 650 MG RE SUPP: 325.0000 mg | RECTAL | Status: DC | PRN

## 2011-08-20 MED ORDER — LORATADINE 10 MG PO TABS
10.0000 mg | ORAL_TABLET | Freq: Every day | ORAL | Status: DC
  Filled 2011-08-20: qty 1

## 2011-08-20 MED ORDER — ATORVASTATIN CALCIUM 40 MG PO TABS
40.0000 mg | ORAL_TABLET | Freq: Every day | ORAL | Status: DC
  Administered 2011-08-20: 40 mg via ORAL
  Filled 2011-08-20 (×2): qty 1

## 2011-08-20 MED ORDER — DOCUSATE SODIUM 100 MG PO CAPS: 100.0000 mg | ORAL_CAPSULE | Freq: Every day | ORAL | Status: DC

## 2011-08-20 MED ORDER — DEXTROSE 5 % IV SOLN
1.5000 g | INTRAVENOUS | Status: DC | PRN
  Administered 2011-08-20: 1.5 g via INTRAVENOUS

## 2011-08-20 MED ORDER — CLOPIDOGREL BISULFATE 75 MG PO TABS
75.0000 mg | ORAL_TABLET | Freq: Every day | ORAL | Status: DC
  Filled 2011-08-20: qty 1

## 2011-08-20 MED ORDER — LIDOCAINE HCL 4 % MT SOLN
OROMUCOSAL | Status: DC | PRN
  Administered 2011-08-20: 4 mL via TOPICAL

## 2011-08-20 MED ORDER — 0.9 % SODIUM CHLORIDE (POUR BTL) OPTIME
TOPICAL | Status: DC | PRN
  Administered 2011-08-20: 1000 mL

## 2011-08-20 MED ORDER — OXYCODONE-ACETAMINOPHEN 5-325 MG PO TABS
1.0000 | ORAL_TABLET | ORAL | Status: DC | PRN
  Administered 2011-08-21: 2 via ORAL
  Filled 2011-08-20: qty 2

## 2011-08-20 MED ORDER — FENTANYL CITRATE 0.05 MG/ML IJ SOLN
INTRAMUSCULAR | Status: DC | PRN
  Administered 2011-08-20 (×5): 50 ug via INTRAVENOUS

## 2011-08-20 MED ORDER — ALUM & MAG HYDROXIDE-SIMETH 200-200-20 MG/5ML PO SUSP: 15.0000 mL | ORAL | Status: DC | PRN

## 2011-08-20 MED ORDER — HYDROMORPHONE HCL PF 1 MG/ML IJ SOLN: 0.2500 mg | INTRAMUSCULAR | Status: DC | PRN

## 2011-08-20 MED ORDER — SODIUM CHLORIDE 0.9 % IR SOLN
Status: DC | PRN
  Administered 2011-08-20: 09:00:00

## 2011-08-20 MED ORDER — ACETAMINOPHEN 325 MG PO TABS: 325.0000 mg | ORAL_TABLET | ORAL | Status: DC | PRN

## 2011-08-20 MED ORDER — GUAIFENESIN-DM 100-10 MG/5ML PO SYRP: 15.0000 mL | ORAL_SOLUTION | ORAL | Status: DC | PRN

## 2011-08-20 MED ORDER — ONDANSETRON HCL 4 MG/2ML IJ SOLN: 4.0000 mg | Freq: Four times a day (QID) | INTRAMUSCULAR | Status: DC | PRN

## 2011-08-20 MED ORDER — HYDRALAZINE HCL 20 MG/ML IJ SOLN: 10.0000 mg | INTRAMUSCULAR | Status: DC | PRN

## 2011-08-20 MED ORDER — PHENOL 1.4 % MT LIQD: 1.0000 | OROMUCOSAL | Status: DC | PRN

## 2011-08-20 MED ORDER — SODIUM CHLORIDE 0.9 % IV SOLN
500.0000 mL | Freq: Once | INTRAVENOUS | Status: AC | PRN
  Administered 2011-08-20: 500 mL via INTRAVENOUS

## 2011-08-20 MED ORDER — PANTOPRAZOLE SODIUM 40 MG PO TBEC
40.0000 mg | DELAYED_RELEASE_TABLET | Freq: Every day | ORAL | Status: DC
  Administered 2011-08-20: 40 mg via ORAL
  Filled 2011-08-20: qty 1

## 2011-08-20 MED ORDER — FUROSEMIDE 40 MG PO TABS
40.0000 mg | ORAL_TABLET | Freq: Every day | ORAL | Status: DC
  Filled 2011-08-20: qty 1

## 2011-08-20 MED ORDER — CILOSTAZOL 50 MG PO TABS
50.0000 mg | ORAL_TABLET | Freq: Two times a day (BID) | ORAL | Status: DC
  Administered 2011-08-20: 50 mg via ORAL
  Filled 2011-08-20 (×3): qty 1

## 2011-08-20 MED ORDER — LIDOCAINE HCL (CARDIAC) 20 MG/ML IV SOLN
INTRAVENOUS | Status: DC | PRN
  Administered 2011-08-20: 50 mg via INTRAVENOUS

## 2011-08-20 MED ORDER — PHENYLEPHRINE HCL 10 MG/ML IJ SOLN
20.0000 mg | INTRAVENOUS | Status: DC | PRN
  Administered 2011-08-20: 30 ug/min via INTRAVENOUS

## 2011-08-20 MED ORDER — HEMOSTATIC AGENTS (NO CHARGE) OPTIME
TOPICAL | Status: DC | PRN
  Administered 2011-08-20: 1 via TOPICAL

## 2011-08-20 MED ORDER — DEXTROSE 5 % IV SOLN
1.5000 g | Freq: Two times a day (BID) | INTRAVENOUS | Status: AC
  Administered 2011-08-20 – 2011-08-21 (×2): 1.5 g via INTRAVENOUS
  Filled 2011-08-20 (×3): qty 1.5

## 2011-08-20 MED ORDER — POTASSIUM CHLORIDE CRYS ER 20 MEQ PO TBCR: 20.0000 meq | EXTENDED_RELEASE_TABLET | Freq: Once | ORAL | Status: AC | PRN

## 2011-08-20 MED ORDER — LACTATED RINGERS IV SOLN
INTRAVENOUS | Status: DC | PRN
  Administered 2011-08-20 (×2): via INTRAVENOUS

## 2011-08-20 MED ORDER — LOSARTAN POTASSIUM 25 MG PO TABS
25.0000 mg | ORAL_TABLET | Freq: Every day | ORAL | Status: DC
  Filled 2011-08-20: qty 1

## 2011-08-20 MED ORDER — DOPAMINE-DEXTROSE 3.2-5 MG/ML-% IV SOLN
3.0000 ug/kg/min | INTRAVENOUS | Status: DC
Start: 2011-08-20 — End: 2011-08-21

## 2011-08-20 MED ORDER — METOPROLOL SUCCINATE ER 25 MG PO TB24
25.0000 mg | ORAL_TABLET | Freq: Every day | ORAL | Status: DC
  Filled 2011-08-20: qty 1

## 2011-08-20 MED ORDER — PROTAMINE SULFATE 10 MG/ML IV SOLN
INTRAVENOUS | Status: DC | PRN
  Administered 2011-08-20: 20 mg via INTRAVENOUS
  Administered 2011-08-20 (×3): 10 mg via INTRAVENOUS

## 2011-08-20 MED ORDER — METOPROLOL TARTRATE 1 MG/ML IV SOLN: 2.0000 mg | INTRAVENOUS | Status: DC | PRN

## 2011-08-20 MED ORDER — FOLIC ACID 1 MG PO TABS
1.0000 mg | ORAL_TABLET | Freq: Every day | ORAL | Status: DC
  Filled 2011-08-20: qty 1

## 2011-08-20 MED ORDER — MORPHINE SULFATE 2 MG/ML IJ SOLN
2.0000 mg | INTRAMUSCULAR | Status: DC | PRN
  Administered 2011-08-21: 2 mg via INTRAVENOUS
  Filled 2011-08-20: qty 1

## 2011-08-20 MED ORDER — SODIUM CHLORIDE 0.9 % IV SOLN
INTRAVENOUS | Status: DC
  Administered 2011-08-20: 20:00:00 via INTRAVENOUS

## 2011-08-20 MED ORDER — ASPIRIN 81 MG PO CHEW: 81.0000 mg | CHEWABLE_TABLET | Freq: Every day | ORAL | Status: DC

## 2011-08-20 MED ORDER — HEPARIN SODIUM (PORCINE) 1000 UNIT/ML IJ SOLN
INTRAMUSCULAR | Status: DC | PRN
  Administered 2011-08-20: 1 mL via INTRAVENOUS
  Administered 2011-08-20: 6 mL via INTRAVENOUS

## 2011-08-20 MED ORDER — OXYCODONE-ACETAMINOPHEN 5-325 MG PO TABS: 1.0000 | ORAL_TABLET | ORAL | Status: AC | PRN

## 2011-08-20 MED ORDER — GLYCOPYRROLATE 0.2 MG/ML IJ SOLN
INTRAMUSCULAR | Status: DC | PRN
  Administered 2011-08-20: .2 mg via INTRAVENOUS
  Administered 2011-08-20: .8 mg via INTRAVENOUS

## 2011-08-20 MED ORDER — LABETALOL HCL 5 MG/ML IV SOLN: 10.0000 mg | INTRAVENOUS | Status: DC | PRN

## 2011-08-20 MED ORDER — NEOSTIGMINE METHYLSULFATE 1 MG/ML IJ SOLN
INTRAMUSCULAR | Status: DC | PRN
  Administered 2011-08-20: 5 mg via INTRAVENOUS
  Administered 2011-08-20: 2 mg via INTRAVENOUS

## 2011-08-20 MED ORDER — ROCURONIUM BROMIDE 100 MG/10ML IV SOLN
INTRAVENOUS | Status: DC | PRN
  Administered 2011-08-20: 50 mg via INTRAVENOUS
  Administered 2011-08-20: 20 mg via INTRAVENOUS
  Administered 2011-08-20: 40 mg via INTRAVENOUS
  Administered 2011-08-20: 30 mg via INTRAVENOUS

## 2011-08-20 MED ORDER — PROPOFOL 10 MG/ML IV EMUL
INTRAVENOUS | Status: DC | PRN
  Administered 2011-08-20: 170 mg via INTRAVENOUS

## 2011-08-20 MED ORDER — FLUTICASONE-SALMETEROL 250-50 MCG/DOSE IN AEPB
1.0000 | INHALATION_SPRAY | Freq: Two times a day (BID) | RESPIRATORY_TRACT | Status: DC
  Administered 2011-08-20 – 2011-08-21 (×2): 1 via RESPIRATORY_TRACT
  Filled 2011-08-20: qty 14

## 2011-08-20 MED ORDER — MORPHINE SULFATE 2 MG/ML IJ SOLN
INTRAMUSCULAR | Status: AC
  Administered 2011-08-20: 2 mg via INTRAMUSCULAR
  Filled 2011-08-20: qty 1

## 2011-08-20 SURGICAL SUPPLY — 58 items
BLADE SURG ROTATE 9660 (MISCELLANEOUS) ×2 IMPLANT
CANISTER SUCTION 2500CC (MISCELLANEOUS) ×2 IMPLANT
CATH ROBINSON RED A/P 18FR (CATHETERS) ×2 IMPLANT
CATH SUCT 10FR WHISTLE TIP (CATHETERS) ×2 IMPLANT
CLIP TI MEDIUM 24 (CLIP) ×2 IMPLANT
CLIP TI WIDE RED SMALL 24 (CLIP) ×2 IMPLANT
CLOTH BEACON ORANGE TIMEOUT ST (SAFETY) ×2 IMPLANT
COVER SURGICAL LIGHT HANDLE (MISCELLANEOUS) ×4 IMPLANT
CRADLE DONUT ADULT HEAD (MISCELLANEOUS) ×2 IMPLANT
DERMABOND ADVANCED (GAUZE/BANDAGES/DRESSINGS) ×1
DERMABOND ADVANCED .7 DNX12 (GAUZE/BANDAGES/DRESSINGS) ×1 IMPLANT
DRAIN CHANNEL 15F RND FF W/TCR (WOUND CARE) IMPLANT
DRAPE INCISE IOBAN 66X45 STRL (DRAPES) ×2 IMPLANT
DRAPE WARM FLUID 44X44 (DRAPE) ×2 IMPLANT
ELECT REM PT RETURN 9FT ADLT (ELECTROSURGICAL) ×2
ELECTRODE REM PT RTRN 9FT ADLT (ELECTROSURGICAL) ×1 IMPLANT
EVACUATOR SILICONE 100CC (DRAIN) IMPLANT
GLOVE BIO SURGEON STRL SZ 6.5 (GLOVE) ×2 IMPLANT
GLOVE BIOGEL M 6.5 STRL (GLOVE) ×6 IMPLANT
GLOVE BIOGEL PI IND STRL 6.5 (GLOVE) ×4 IMPLANT
GLOVE BIOGEL PI IND STRL 7.0 (GLOVE) ×5 IMPLANT
GLOVE BIOGEL PI IND STRL 7.5 (GLOVE) ×4 IMPLANT
GLOVE BIOGEL PI INDICATOR 6.5 (GLOVE) ×4
GLOVE BIOGEL PI INDICATOR 7.0 (GLOVE) ×5
GLOVE BIOGEL PI INDICATOR 7.5 (GLOVE) ×4
GLOVE SS BIOGEL STRL SZ 7 (GLOVE) ×2 IMPLANT
GLOVE SUPERSENSE BIOGEL SZ 7 (GLOVE) ×2
GLOVE SURG SS PI 7.0 STRL IVOR (GLOVE) ×4 IMPLANT
GLOVE SURG SS PI 7.5 STRL IVOR (GLOVE) ×6 IMPLANT
GOWN PREVENTION PLUS XXLARGE (GOWN DISPOSABLE) IMPLANT
GOWN STRL NON-REIN LRG LVL3 (GOWN DISPOSABLE) IMPLANT
HEMOSTAT SNOW SURGICEL 2X4 (HEMOSTASIS) ×2 IMPLANT
HEMOSTAT SURGICEL 2X14 (HEMOSTASIS) IMPLANT
INSERT FOGARTY SM (MISCELLANEOUS) ×2 IMPLANT
KIT BASIN OR (CUSTOM PROCEDURE TRAY) ×2 IMPLANT
KIT ROOM TURNOVER OR (KITS) ×2 IMPLANT
NS IRRIG 1000ML POUR BTL (IV SOLUTION) ×4 IMPLANT
PACK CAROTID (CUSTOM PROCEDURE TRAY) ×2 IMPLANT
PAD ARMBOARD 7.5X6 YLW CONV (MISCELLANEOUS) ×4 IMPLANT
PUNCH AORTIC ROTATE 4.0MM (MISCELLANEOUS) ×2 IMPLANT
SHUNT CAROTID BYPASS 10 (VASCULAR PRODUCTS) IMPLANT
SHUNT CAROTID BYPASS 12FRX15.5 (VASCULAR PRODUCTS) IMPLANT
SPECIMEN JAR SMALL (MISCELLANEOUS) ×2 IMPLANT
SUT ETHILON 3 0 PS 1 (SUTURE) IMPLANT
SUT PROLENE 6 0 BV (SUTURE) ×14 IMPLANT
SUT PROLENE 7 0 BV 1 (SUTURE) IMPLANT
SUT SILK 1 TIES 10X30 (SUTURE) ×2 IMPLANT
SUT SILK 3 0 (SUTURE) ×1
SUT SILK 3-0 18XBRD TIE 12 (SUTURE) ×1 IMPLANT
SUT VIC AB 2-0 CT1 27 (SUTURE) ×4
SUT VIC AB 2-0 CT1 TAPERPNT 27 (SUTURE) ×4 IMPLANT
SUT VIC AB 3-0 SH 27 (SUTURE) ×2
SUT VIC AB 3-0 SH 27X BRD (SUTURE) ×2 IMPLANT
SUT VICRYL 4-0 PS2 18IN ABS (SUTURE) ×2 IMPLANT
TOWEL OR 17X24 6PK STRL BLUE (TOWEL DISPOSABLE) ×4 IMPLANT
TOWEL OR 17X26 10 PK STRL BLUE (TOWEL DISPOSABLE) ×2 IMPLANT
TRAY FOLEY CATH 14FRSI W/METER (CATHETERS) ×2 IMPLANT
WATER STERILE IRR 1000ML POUR (IV SOLUTION) ×2 IMPLANT

## 2011-08-20 NOTE — Anesthesia Postprocedure Evaluation (Signed)
Anesthesia Post Note  Patient: [name]  Procedure(s) Performed: Procedure(s) (LRB): BYPASS GRAFT CAROTID-VERTEBRAL (Right)  Anesthesia type: General  Patient location: PACU  Post pain: Pain level controlled and Adequate analgesia  Post assessment: Post-op Vital signs reviewed, Patient's Cardiovascular Status Stable, Respiratory Function Stable, Patent Airway and Pain level controlled  Last Vitals:  Filed Vitals:   08/20/11 1400  BP:   Pulse: 65  Temp:   Resp: 22    Post vital signs: Reviewed and stable  Level of consciousness: awake, alert  and oriented  Complications: No apparent anesthesia complications

## 2011-08-20 NOTE — Anesthesia Preprocedure Evaluation (Addendum)
Anesthesia Evaluation  Patient identified by MRN, date of birth, ID band Patient awake    Reviewed: Allergy & Precautions, H&P , NPO status , Patient's Chart, lab work & pertinent test results  History of Anesthesia Complications Negative for: history of anesthetic complications  Airway Mallampati: II TM Distance: >3 FB Neck ROM: full    Dental  (+) Dental Advisory Given, Edentulous Upper, Lower Dentures and Upper Dentures   Pulmonary shortness of breath, with exertion, at rest, lying and Long-Term Oxygen Therapy, COPD COPD inhaler and oxygen dependent, former smoker         Cardiovascular hypertension, Pt. on home beta blockers + CAD, + Past MI, + CABG and +CHF     Neuro/Psych negative neurological ROS  negative psych ROS   GI/Hepatic negative GI ROS, Neg liver ROS,   Endo/Other    Renal/GU negative Renal ROS  negative genitourinary   Musculoskeletal negative musculoskeletal ROS (+)   Abdominal   Peds negative pediatric ROS (+)  Hematology negative hematology ROS (+)   Anesthesia Other Findings   Reproductive/Obstetrics negative OB ROS                       Anesthesia Physical Anesthesia Plan  ASA: III  Anesthesia Plan: General   Post-op Pain Management:    Induction: Intravenous  Airway Management Planned: Oral ETT  Additional Equipment: Arterial line  Intra-op Plan:   Post-operative Plan: Extubation in OR  Informed Consent: I have reviewed the patients History and Physical, chart, labs and discussed the procedure including the risks, benefits and alternatives for the proposed anesthesia with the patient or authorized representative who has indicated his/her understanding and acceptance.     Plan Discussed with: CRNA and Surgeon  Anesthesia Plan Comments:         Anesthesia Quick Evaluation

## 2011-08-20 NOTE — Telephone Encounter (Signed)
Message copied by Fredrich Birks on Thu Aug 20, 2011  4:04 PM ------      Message from: Melene Plan      Created: Thu Aug 20, 2011  2:49 PM                   ----- Message -----         From: [place], [place]         Sent: 08/20/2011  12:39 PM           To: Melene Plan, RN            2 weeks F/U right carotid vertebral artery bypass - [place]

## 2011-08-20 NOTE — Preoperative (Signed)
Beta Blockers   Reason not to administer Beta Blockers:Not Applicable 

## 2011-08-20 NOTE — Transfer of Care (Signed)
Immediate Anesthesia Transfer of Care Note  Patient: [name]  Procedure(s) Performed: Procedure(s) (LRB): BYPASS GRAFT CAROTID-VERTEBRAL (Right)  Patient Location: PACU  Anesthesia Type: General  Level of Consciousness: awake, alert  and oriented  Airway & Oxygen Therapy: Patient Spontanous Breathing and Patient connected to face mask  Post-op Assessment: Report given to PACU RN and Post -op Vital signs reviewed and unstable, Anesthesiologist notified  Post vital signs: Reviewed and stable  Complications: No apparent anesthesia complications

## 2011-08-20 NOTE — Progress Notes (Signed)
IV NS bolus initiated for Bp 83/46 as per standing orders, will cont to assess

## 2011-08-20 NOTE — Progress Notes (Signed)
Second bolus initiated as per MD standing orders, will cont to assess

## 2011-08-20 NOTE — H&P (View-Only) (Signed)
[facility]ascular and Vein Specialist of    Patient name: [name] MRN: [id number] DOB: 01/06/1932 Sex: male   Referred by: Dr. Gangi  Reason for referral:  Chief Complaint  Patient presents with  . Sub-clavian    New pt subclavian stenosis- Dr. Gangi    HISTORY OF PRESENT ILLNESS: In today for further evaluation of balance problems. She has recently undergone cardiac and arch angiography by Dr.Gangi. This revealed an atretic left vertebral artery with a dominant right vertebral artery however there was a high-grade stenosis at the origin. The patient states that he has had a very difficult time with balance for the past 20+ years. He feels that he will surround as if he is drunk. He is afraid to wall I his [place] at home because he may fall into it. He has multiple bruises on his arms and legs for frequent falls. He does not endorse any right upper extremity symptoms. He denies rest pain or claudication like symptoms. Occasionally the right arm will get tired with excessive activity but this is not very common.  The patient is on 3 L of continuous oxygen secondary to emphysematous changes and a history of lung resection which turned out to be negative for malignancy. He is also status post coronary artery bypass grafting for coronary artery disease. He did not have any chest pain at this time. He is taking Plavix. He also suffers from cardiomyopathy. He has a history of a DVT.  Past Medical History  Diagnosis Date  . Pulmonary nodule     s/p wedge resction, inflammatory, +MAC  . Emphysema   . Myocardial infarction   . Cardiomyopathy     EF 25% by echo 2009  . COPD (chronic obstructive pulmonary disease)   . CAD (coronary artery disease)   . DVT (deep venous thrombosis)     Past Surgical History  Procedure Date  . Carotid endarterectomy 2009    triple bypass  . Lung surgery 2008    tumor    History   Social History  . Marital Status: Divorced    Spouse Name: N/A   Number of Children: N/A  . Years of Education: N/A   Occupational History  . Retired     Textiles   Social History Main Topics  . Smoking status: Former Smoker -- 2.0 packs/day for 56 years    Types: Cigarettes    Quit date: 03/30/2006  . Smokeless tobacco: Not on file  . Alcohol Use: No     occ  . Drug Use: No  . Sexually Active: Not on file   Other Topics Concern  . Not on file   Social History Narrative  . No narrative on file    Family History  Problem Relation Age of Onset  . Heart disease Father   . Diabetes Sister     Allergies as of 08/10/2011 - Review Complete 08/10/2011  Allergen Reaction Noted  . Tetanus toxoids Other (See Comments) 04/06/2011    Current Outpatient Prescriptions on File Prior to Visit  Medication Sig Dispense Refill  . albuterol (PROVENTIL) (2.5 MG/3ML) 0.083% nebulizer solution Take 2.5 mg by nebulization 4 (four) times daily as needed. For shortness of breath      . aspirin 81 MG chewable tablet Chew 81 mg by mouth daily.      . atorvastatin (LIPITOR) 40 MG tablet Take 40 mg by mouth daily.      . cetirizine (ZYRTEC) 10 MG tablet Take 10 mg by mouth daily   as needed. For runny nose      . cilostazol (PLETAL) 100 MG tablet Take 100 mg by mouth 2 (two) times daily.       . clopidogrel (PLAVIX) 75 MG tablet Take 75 mg by mouth daily.       . Fluticasone-Salmeterol (ADVAIR) 250-50 MCG/DOSE AEPB Inhale 1 puff into the lungs every 12 (twelve) hours.      . folic acid (FOLVITE) 1 MG tablet Take 1 mg by mouth daily.       . furosemide (LASIX) 40 MG tablet Take 40 mg by mouth daily.       . metoprolol succinate (TOPROL-XL) 25 MG 24 hr tablet Take 25 mg by mouth daily.      . vitamin B-12 (CYANOCOBALAMIN) 1000 MCG tablet Take 1,000 mcg by mouth daily.      . albuterol (PROAIR HFA) 108 (90 BASE) MCG/ACT inhaler Inhale 2 puffs into the lungs every 6 (six) hours as needed. For shortness of breath         REVIEW OF SYSTEMS: Positive for shortness of  breath with exertion and when lying flat. Positive for history of a DVT. Positive for varicose veins. Positive for weakness in his arms and legs. Positive for dizziness. All other review of systems are negative as documented by the patient and the encounter form.  PHYSICAL EXAMINATION: General: The patient appears their stated age.  Vital signs are BP 112/67  Pulse 78  Temp(Src) 97.8 F (36.6 C) (Oral)  Ht 5' 7" (1.702 m)  Wt 177 lb (80.287 kg)  BMI 27.72 kg/m2 HEENT:  No gross abnormalities Pulmonary: Respirations are non-labored Abdomen: Soft and non-tender  Musculoskeletal: There are no major deformities.   Neurologic: No focal weakness or paresthesias are detected, Skin: There are no ulcer or rashes noted. Psychiatric: The patient has normal affect. Cardiovascular: There is a regular rate and rhythm without significant murmur appreciated. No carotid bruits. Palpable left radial pulse, nonpalpable right brachial and radial pulse.  Diagnostic Studies: None today  Outside Studies/Documentation Historical records were reviewed.  They showed stenosis at the origin of the right vertebral artery.  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 With an occluded left vertebral artery and a dominant right vertebral artery with a high-grade stenosis at its origin, this could potentially explain his symptoms. He does not endorse significant right upper extremity problems. After reviewing his films I believe that he is a candidate for transposition of his right vertebral artery. I more than likely will not revascularize his right arm at the same time given his lack of symptoms. I had an extensive discussion with the patient regarding the operation. We discussed the risks which include but are not limited to worsening pulmonary complications secondary  to phrenic nerve injury, thoracic duct injury with lymphatic leak, the possibility of stroke, as well as the possibility that revascularization may not improve his symptoms. At this point the patient wishes to proceed. I will give formal pulmonary clearance from Dr. Clance. His operation is been scheduled for Thursday, May 23. I will stop his Plavix 5 days prior.     V. Wells Corleone Biegler IV, M.D. [facility] [place] Office: [phone] Pager:  [phone]   

## 2011-08-20 NOTE — Telephone Encounter (Signed)
lvm for patient re appt 09/07/11 at 10:15am

## 2011-08-20 NOTE — Interval H&P Note (Signed)
History and Physical Interval Note:  08/20/2011 7:27 AM  [name]  has presented today for surgery, with the diagnosis of SUBCLAVIAN STENOSIS  The various methods of treatment have been discussed with the patient and family. After consideration of risks, benefits and other options for treatment, the patient has consented to  Procedure(s) (LRB): BYPASS GRAFT CAROTID-VERTEBRAL (Right) as a surgical intervention .  The patients' history has been reviewed, patient examined, no change in status, stable for surgery.  I have reviewed the patients' chart and labs.  Questions were answered to the patient's satisfaction.     [name], V. WELLS

## 2011-08-20 NOTE — Progress Notes (Signed)
CPAP removed per Dr.Hodiernes orders, pt placed on [place] 4L, will cont to assess, cuff pressure reads86/50, art line pressure reading 104/39, Dr.Hodierne updated, pt receiving second 500cc NS Bolus, Will cont to assess

## 2011-08-20 NOTE — Op Note (Signed)
[facility] Specialists of [facility]  Patient name: [name] MRN: [id number] DOB: [birth year] Sex: male  08/20/2011 Pre-operative Diagnosis: Vertebral artery stenosis, right Post-operative diagnosis:  Same Surgeon:  Jorge Ny Assistants:  Jerilee Field, M.D., Della Goo, P.A. Procedure:   Right vertebral to Right Carotid transposition Anesthesia:  General Blood Loss:  See anesthesia record Specimens:  None  Findings:  Plaque at the origin of the right vertebral artery. The vertebral artery was divided at its origin and transposed to the posterior lateral side of the right carotid artery.  Indications:  The patient has recently undergone cerebral angiography which reveals a calcified plaque at the origin of the right vertebral artery which is also obstructive to his right subclavian artery. He does not have symptoms in his right arm however does have significant balance issues to where he has trouble going out of his house. The left vertebral artery is occluded. He comes in today for vertebral artery transposition.  Procedure:  The patient was identified in the holding area and taken to [facility] OR ROOM 09  The patient was then placed supine on the table. general anesthesia was administered.  The patient was prepped and draped in the usual sterile fashion.  A time out was called and antibiotics were administered.  A supraclavicular incision was made on the right from the head of the clavicle lateral for approximately 5 cm. Cautery was used to divide the subcutaneous tissue. The platysma muscle was divided with cautery. I separated the sternal and clavicular heads of the sternocleidomastoid with Bovie cautery and retracted them medially and laterally. I had to divide the tendinous portion of the omohyoid muscle. The internal jugular vein was then dissected free. It was encircled with a vessel loop. I then proceeded to identify the right common carotid artery. It was soft without  significant plaque. The vagus nerve was visualized and the posterior side of the carotid sheath and was protected. I then dissected down between the carotid and internal jugular vein until I got to the right subclavian artery. This was very deep within the wound. After mobilization of the subclavian artery I noticed that the thyrocervical trunk and the vertebral artery had origins there were very near each other. I first dissected out the thyrocervical trunk and had to divide one branch in order to provide adequate exposure to the vertebral artery. I also ligated the vertebral vein between silk ties as well as the thoracic duct. The origin of the vertebral artery was on the posterior side of the subclavian artery and therefore it was difficult to mobilize but ultimately I was able to fully expose the vertebral artery. I traced it out for approximately 3 cm before became very posterior in the wound. At this point I felt I had adequate length to proceed although it was going to be a difficult anastomosis. At this point the patient was fully heparinized. I selected a site on the posterior lateral side of the common carotid artery for the anastomosis. This was marked with an ink pen. After the heparin circulated, I placed a #1 silk tie at the origin of the vertebral artery and placed a metal clip on the subclavian side of the tie. Metzenbaum scissors were then used to transect the vertebral artery. Prior to transecting this a Hanley clamp was placed as far distally as I could get this. The vertebral artery was disease-free. It was approximately a 7 mm artery. At this point the common carotid artery was occluded.  A #11 blade was used to make an arteriotomy which was extended with a #4 punch. An end-to-side anastomosis was created with a running 6-0 Prolene. Prior to completion the appropriate flushing maneuvers were performed and the anastomosis was completed. There was noted to be a tear in the vertebral artery at the  level of the clamp. It is very difficult to get this area fully exposed but ultimately it was prepared with 6-0 Prolene suture. Hand-held Doppler was used to evaluate the signals in the carotid vertebral artery. A all had appropriate signals. 50 mg of protamine was then administered. After hemostasis was determined to be adequate the omohyoid muscle was reapproximated as was the sternal and clavicular head of the sternocleidomastoid. The platysma muscle was closed with 3-0 Vicryl the skin was closed with 4-0 Vicryl. Dermabond was placed on the skin.   Disposition:  To PACU in stable condition.   Juleen China, M.D. [facility] [place] Office: [phone] Pager:  [phone]

## 2011-08-21 LAB — BASIC METABOLIC PANEL
CO2: 24 mEq/L (ref 19–32)
Chloride: 107 mEq/L (ref 96–112)
GFR calc Af Amer: 76 mL/min — ABNORMAL LOW (ref >90)
Potassium: 4 mEq/L (ref 3.5–5.1)

## 2011-08-21 LAB — CBC
HCT: 33.5 % — ABNORMAL LOW (ref 39.0–52.0)
MCV: 87.7 fL (ref 78.0–100.0)
Platelets: 161 10*3/uL (ref 150–400)
RBC: 3.82 MIL/uL — ABNORMAL LOW (ref 4.22–5.81)
RDW: 13.3 % (ref 11.5–15.5)
WBC: 7.4 10*3/uL (ref 4.0–10.5)

## 2011-08-21 MED ORDER — PNEUMOCOCCAL VAC POLYVALENT 25 MCG/0.5ML IJ INJ: 0.5000 mL | INJECTION | INTRAMUSCULAR | Status: DC

## 2011-08-21 NOTE — Discharge Summary (Signed)
Agree with above  [name] 

## 2011-08-21 NOTE — Progress Notes (Signed)
VASCULAR AND VEIN SURGERY POST - OP [facility] PROGRESS NOTE  Date of Surgery: 08/20/2011 Surgeon: Surgeon(s): Nada Libman, MD Pryor Ochoa, MD 1 Day Post-Op right vertebral Carotid bypass .  HPI: [name] is a 78 y.o. male who is 1 Day Post-Op. Patient is doing well.  Patient denies headache; Patient denies difficulty swallowing; denies weakness in upper or lower extremities; Pt. denies other symptoms of stroke or TIA.  IMAGING: No results found.  Significant Diagnostic Studies: CBC Lab Results  Component Value Date   WBC 7.4 08/21/2011   HGB 11.4* 08/21/2011   HCT 33.5* 08/21/2011   MCV 87.7 08/21/2011   PLT 161 08/21/2011    BMET    Component Value Date/Time   NA 139 08/21/2011 0400   K 4.0 08/21/2011 0400   CL 107 08/21/2011 0400   CO2 24 08/21/2011 0400   GLUCOSE 96 08/21/2011 0400   BUN 18 08/21/2011 0400   CREATININE 1.05 08/21/2011 0400   CALCIUM 8.2* 08/21/2011 0400   GFRNONAA 66* 08/21/2011 0400   GFRAA 76* 08/21/2011 0400    COAG Lab Results  Component Value Date   INR 1.15 08/18/2011   INR 1.6* 08/17/2007   INR 1.1 08/15/2007   No results found for this basename: PTT      Intake/Output Summary (Last 24 hours) at 08/21/11 0747 Last data filed at 08/21/11 0349  Gross per 24 hour  Intake 3979.5 ml  Output   2425 ml  Net 1554.5 ml    Physical Exam:  BP Readings from Last 3 Encounters:  08/21/11 128/54  08/21/11 128/54  08/18/11 147/73   Temp Readings from Last 3 Encounters:  08/21/11 97.9 F (36.6 C) Oral  08/21/11 97.9 F (36.6 C) Oral  08/18/11 97.2 F (36.2 C)    SpO2 Readings from Last 3 Encounters:  08/21/11 95%  08/21/11 95%  08/18/11 93%   Pulse Readings from Last 3 Encounters:  08/21/11 83  08/21/11 83  08/18/11 91    Pt is A&O x 3 Gait is normal Speech is fluent right Neck Wound is healing well - mod swelling but soft Patient with Negative tongue deviation and Negative facial droop Pt has good and equal strength in all  extremities  Assessment: [name] is a 78 y.o. male is doing well Pt is voiding, ambulating and taking po well On O2 at home   Plan: Discharge to: Home Follow-up in 2 weeks   [name] J [phone] 08/21/2011 7:47 AM

## 2011-08-21 NOTE — Discharge Summary (Signed)
Vascular and Vein Specialists Discharge Summary   Patient ID:  [name] MRN: [id number] DOB/AGE: Jun 29, 1931 78 y.o.  Admit date: 08/20/2011 Discharge date: 08/21/2011 Date of Surgery: 08/20/2011 Surgeon: Surgeon(s): Nada Libman, MD Pryor Ochoa, MD  Admission Diagnosis: SUBCLAVIAN STENOSIS  Discharge Diagnoses:  SUBCLAVIAN STENOSIS  Secondary Diagnoses: Past Medical History  Diagnosis Date  . Pulmonary nodule     s/p wedge resction, inflammatory, +MAC  . Emphysema   . Cardiomyopathy     EF 25% by echo 2009  . DVT (deep venous thrombosis)   . CAD (coronary artery disease)     Dr Jacinto Halim every 6 months  . Hypertension   . Peripheral vascular disease   . Myocardial infarction 1986  . Shortness of breath   . COPD (chronic obstructive pulmonary disease)     on O2 constantly  . Keeps Losing Balance   . Anemia   . Vitamin B 12 deficiency     Procedure(s): BYPASS GRAFT CAROTID-VERTEBRAL  Discharged Condition: good  HPI:  [name] is a 78 y.o. male who has recently undergone cerebral angiography which reveals a calcified plaque at the origin of the right vertebral artery which is also obstructive to his right subclavian artery. He does not have symptoms in his right arm however does have significant balance issues to where he has trouble going out of his house. The left vertebral artery is occluded. He comes in today for vertebral artery transposition.  Hospital Course:  [name] is a 78 y.o. male is S/P Right Procedure(s): BYPASS GRAFT CAROTID-VERTEBRAL Extubated: POD # 0 Post-op wounds healing well Pt. Ambulating, voiding and taking PO diet without difficulty. Pt pain controlled with PO pain meds. Labs as below Complications:none  Consults:     Significant Diagnostic Studies: CBC Lab Results  Component Value Date   WBC 7.4 08/21/2011   HGB 11.4* 08/21/2011   HCT 33.5* 08/21/2011   MCV 87.7 08/21/2011   PLT 161 08/21/2011    BMET      Component Value Date/Time   NA 139 08/21/2011 0400   K 4.0 08/21/2011 0400   CL 107 08/21/2011 0400   CO2 24 08/21/2011 0400   GLUCOSE 96 08/21/2011 0400   BUN 18 08/21/2011 0400   CREATININE 1.05 08/21/2011 0400   CALCIUM 8.2* 08/21/2011 0400   GFRNONAA 66* 08/21/2011 0400   GFRAA 76* 08/21/2011 0400   COAG Lab Results  Component Value Date   INR 1.15 08/18/2011   INR 1.6* 08/17/2007   INR 1.1 08/15/2007     Disposition:  Discharge to :Home Discharge Orders    Future Appointments: Provider: Department: Dept Phone: Center:   09/07/2011 10:15 AM Nada Libman, MD [facility] [phone] [facility]   10/05/2011 1:30 PM Barbaraann Share, MD [facility] [phone] None     Future Orders Please Complete By Expires   Resume previous diet      Driving Restrictions      Comments:   No driving for 2 weeks   Lifting restrictions      Comments:   No lifting for 4 weeks   Call MD for:  temperature >100.5      Call MD for:  redness, tenderness, or signs of infection (pain, swelling, bleeding, redness, odor or green/yellow discharge around incision site)      Call MD for:  severe or increased pain, loss or decreased feeling  in affected limb(s)      Increase activity slowly  Comments:   Walk with assistance use walker or cane as needed   May shower       Scheduling Instructions:   Sunday   may wash over wound with mild soap and water      No dressing needed      CAROTID Sugery: Call MD for difficulty swallowing or speaking; weakness in arms or legs that is a new symtom; severe headache.  If you have increased swelling in the neck and/or  are having difficulty breathing, CALL 911         [place], Raheem  Home Medication Instructions [id number]   Printed on:08/21/11 0751  Medication Information                    albuterol (PROVENTIL) (2.5 MG/3ML) 0.083% nebulizer solution Take 2.5 mg by nebulization 4 (four) times daily as needed. For shortness of breath           folic acid  (FOLVITE) 1 MG tablet Take 1 mg by mouth daily.            clopidogrel (PLAVIX) 75 MG tablet Take 75 mg by mouth daily.            albuterol (PROAIR HFA) 108 (90 BASE) MCG/ACT inhaler Inhale 2 puffs into the lungs every 6 (six) hours as needed. For shortness of breath           cilostazol (PLETAL) 100 MG tablet Take 50 mg by mouth 2 (two) times daily.            furosemide (LASIX) 40 MG tablet Take 40 mg by mouth daily.            aspirin 81 MG chewable tablet Chew 81 mg by mouth daily.           atorvastatin (LIPITOR) 40 MG tablet Take 40 mg by mouth daily.           metoprolol succinate (TOPROL-XL) 25 MG 24 hr tablet Take 25 mg by mouth daily.           cetirizine (ZYRTEC) 10 MG tablet Take 10 mg by mouth daily as needed. For runny nose           vitamin B-12 (CYANOCOBALAMIN) 1000 MCG tablet Take 1,000 mcg by mouth daily.           Fluticasone-Salmeterol (ADVAIR) 250-50 MCG/DOSE AEPB Inhale 1 puff into the lungs every 12 (twelve) hours.           losartan (COZAAR) 25 MG tablet Take 25 mg by mouth daily.           oxyCODONE-acetaminophen (ROXICET) 5-325 MG per tablet Take 1 tablet by mouth every 4 (four) hours as needed for pain.            Verbal and written Discharge instructions given to the patient. Wound care per Discharge AVS Follow-up Information    Follow up with Myra Gianotti IV, Lala Lund, MD in 2 weeks. (office will arrange - sent)    Contact information:   [address] [place] [address] [phone]          Signed: Marlowe Shores 08/21/2011, 7:51 AM

## 2011-08-22 LAB — TYPE AND SCREEN
ABO/RH(D): B POS
Unit division: 0

## 2011-08-25 ENCOUNTER — Encounter (HOSPITAL_COMMUNITY): Payer: Self-pay | Admitting: Surgery

## 2011-08-25 LAB — POCT I-STAT 7, (LYTES, BLD GAS, ICA,H+H)
Bicarbonate: 23.9 mEq/L (ref 20.0–24.0)
HCT: 36 % — ABNORMAL LOW (ref 39.0–52.0)
Hemoglobin: 12.2 g/dL — ABNORMAL LOW (ref 13.0–17.0)
Patient temperature: 98.1
TCO2: 25 mmol/L (ref 0–100)
pH, Arterial: 7.322 — ABNORMAL LOW (ref 7.350–7.450)
pO2, Arterial: 62 mmHg — ABNORMAL LOW (ref 80.0–100.0)

## 2011-09-04 ENCOUNTER — Encounter: Payer: Self-pay | Admitting: Surgery

## 2011-09-07 ENCOUNTER — Ambulatory Visit (INDEPENDENT_AMBULATORY_CARE_PROVIDER_SITE_OTHER): Payer: Medicare Other | Admitting: Surgery

## 2011-09-07 ENCOUNTER — Encounter: Payer: Self-pay | Admitting: Surgery

## 2011-09-07 VITALS — BP 142/57 | HR 75 | Resp 20 | Ht 67.0 in | Wt 174.1 lb

## 2011-09-07 DIAGNOSIS — I6529 Occlusion and stenosis of unspecified carotid artery: Secondary | ICD-10-CM | POA: Insufficient documentation
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
## 2011-10-05 ENCOUNTER — Encounter: Payer: Self-pay | Admitting: Pulmonary Disease

## 2011-10-05 ENCOUNTER — Ambulatory Visit (INDEPENDENT_AMBULATORY_CARE_PROVIDER_SITE_OTHER): Payer: Medicare Other | Admitting: Pulmonary Disease

## 2011-10-05 VITALS — BP 118/68 | HR 80 | Temp 98.3°F | Ht 67.0 in | Wt 174.0 lb

## 2011-10-05 DIAGNOSIS — J9610 Chronic respiratory failure, unspecified whether with hypoxia or hypercapnia: Secondary | ICD-10-CM

## 2011-10-05 DIAGNOSIS — J438 Other emphysema: Secondary | ICD-10-CM

## 2011-10-05 NOTE — Assessment & Plan Note (Signed)
The patient has severe emphysema with O2 dependency, but is remaining fairly functional.  He is having a little more shortness of breath currently, but a lot of this may be due to the extreme weather.  He is not having any bronchospasm on exam, there is no significant edema, and his weight is stable.  I would like to try adding an anticholinergic, and will use turdoza since he has been intolerant of spiriva in the past.  I've also asked him to work on weight loss and some type of conditioning program.

## 2011-10-05 NOTE — Patient Instructions (Addendum)
Stay on advair one puff in am and pm everyday.  Rinse mouth well Can still use albuterol as needed for rescue Trial of tudorza one puff in am and pm everyday.  Let me know in 4 weeks what you think. Stay out of heat, but stay as active as you can. followup with me in 6mos if doing ok.

## 2011-10-05 NOTE — Progress Notes (Signed)
  Subjective:    Patient ID: [name], male    DOB: [birth year], 78 y.o.   MRN: [id number]  HPI The patient comes in today for followup of his known emphysema.  He recently had vascular bypass surgery, and had no pulmonary complications associated with this.  He is staying on his current medications, but feels that his breathing is a little worse than his usual baseline.  I suspect this is related to the elevated heat and humidity during this time of year.  He denies any chest congestion, cough, or purulent mucus.  He has not had significant worsening of lower extremity edema, and his weight is stable to lower.   Review of Systems  Constitutional: Negative for fever and unexpected weight change.  HENT: Negative for ear pain, nosebleeds, congestion, sore throat, rhinorrhea, sneezing, trouble swallowing, dental problem, postnasal drip and sinus pressure.   Eyes: Negative for redness and itching.  Respiratory: Positive for shortness of breath. Negative for cough, chest tightness and wheezing.   Cardiovascular: Positive for leg swelling. Negative for palpitations.  Gastrointestinal: Negative for nausea and vomiting.  Genitourinary: Negative for dysuria.  Musculoskeletal: Negative for joint swelling.  Skin: Negative for rash.  Neurological: Negative for headaches.  Hematological: Does not bruise/bleed easily.  Psychiatric/Behavioral: Negative for dysphoric mood. The patient is not nervous/anxious.   All other systems reviewed and are negative.       Objective:   Physical Exam Well-developed male in no acute distress Nose without purulence or discharge noted Chest with decreased breath sounds throughout, a few scattered crackles, no wheezing Cardiac exam with regular rate and rhythm Lower extremities with minimal edema, no cyanosis Alert and oriented, moves all 4 extremities.       Assessment & Plan:

## 2011-11-13 ENCOUNTER — Telehealth: Payer: Self-pay | Admitting: Pulmonary Disease

## 2011-11-13 NOTE — Telephone Encounter (Signed)
Pt son advised. Jennifer Castillo, CMA  

## 2011-11-13 NOTE — Telephone Encounter (Signed)
Ok to discontinue if he has not seen a difference.,

## 2011-11-13 NOTE — Telephone Encounter (Signed)
I spoke with pt and he was calling to give [name] update on how he is doing on the [place]. He stated he can't tell it helped with his breathing--its not better and it is no worse. He has been on this x 5 weeks. Please advise [place] thanks

## 2011-12-15 ENCOUNTER — Ambulatory Visit: Payer: Medicare Other | Admitting: Neurosurgery

## 2011-12-15 ENCOUNTER — Other Ambulatory Visit: Payer: Medicare Other

## 2011-12-17 ENCOUNTER — Encounter: Payer: Self-pay | Admitting: Neurosurgery

## 2011-12-18 ENCOUNTER — Encounter: Payer: Self-pay | Admitting: Neurosurgery

## 2011-12-18 ENCOUNTER — Ambulatory Visit (INDEPENDENT_AMBULATORY_CARE_PROVIDER_SITE_OTHER): Payer: Medicare Other | Admitting: Neurosurgery

## 2011-12-18 ENCOUNTER — Ambulatory Visit (INDEPENDENT_AMBULATORY_CARE_PROVIDER_SITE_OTHER): Payer: Medicare Other | Admitting: Vascular Surgery

## 2011-12-18 VITALS — BP 94/65 | HR 67 | Resp 16 | Ht 67.0 in | Wt 168.9 lb

## 2011-12-18 DIAGNOSIS — I6529 Occlusion and stenosis of unspecified carotid artery: Secondary | ICD-10-CM

## 2011-12-18 DIAGNOSIS — Z48812 Encounter for surgical aftercare following surgery on the circulatory system: Secondary | ICD-10-CM

## 2011-12-18 NOTE — Progress Notes (Signed)
Carotid Duplex performed @ [facility] 12/18/2011

## 2011-12-18 NOTE — Progress Notes (Signed)
[facility] [place] Carotid Office Note  CC: Three-month followup for carotid stenosis evaluation Referring Physician: Myra Gianotti  History of Present Illness: 78 year old male patient of Dr. Myra Gianotti who is status post a right vertebral transposition to the right carotid artery in May of 2013. The patient follows up today for carotid duplex. The patient states his right shoulder still has pain and weakness with activity. The patient states he can tolerate the pain but  he feels like if Dr. Myra Gianotti could possibly bypass or stent the subclavian artery he would be willing to talk to him to discuss this. The patient denies any other complications, no signs or symptoms of CVA, TIA, amaurosis fugax. The patient is on nasal cannula oxygen.  Past Medical History  Diagnosis Date  . Pulmonary nodule     s/p wedge resction, inflammatory, +MAC  . Emphysema   . Cardiomyopathy     EF 25% by echo 2009  . DVT (deep venous thrombosis)   . CAD (coronary artery disease)     Dr Jacinto Halim every 6 months  . Hypertension   . Peripheral vascular disease   . Myocardial infarction 1986  . Shortness of breath   . COPD (chronic obstructive pulmonary disease)     on O2 constantly  . Keeps Losing Balance   . Anemia   . Vitamin B 12 deficiency     ROS: [x]  Positive   [ ]  Denies    General: [ ]  Weight loss, [ ]  Fever, [ ]  chills Neurologic: [ ]  Dizziness, [ ]  Blackouts, [ ]  Seizure [ ]  Stroke, [ ]  "Mini stroke", [ ]  Slurred speech, [ ]  Temporary blindness; [ ]  weakness in arms or legs, [ ]  Hoarseness Cardiac: [ ]  Chest pain/pressure, [ ]  Shortness of breath at rest [ ]  Shortness of breath with exertion, [ ]  Atrial fibrillation or irregular heartbeat Vascular: [ ]  Pain in legs with walking, [ ]  Pain in legs at rest, [ ]  Pain in legs at night,  [ ]  Non-healing ulcer, [ ]  Blood clot in vein/DVT,   Pulmonary: [ ]  Home oxygen, [ ]  Productive cough, [ ]  Coughing up blood, [ ]  Asthma,  [ ]   Wheezing Musculoskeletal:  [ ]  Arthritis, [ ]  Low back pain, [ ]  Joint pain Hematologic: [ ]  Easy Bruising, [ ]  Anemia; [ ]  Hepatitis Gastrointestinal: [ ]  Blood in stool, [ ]  Gastroesophageal Reflux/heartburn, [ ]  Trouble swallowing Urinary: [ ]  chronic Kidney disease, [ ]  on HD - [ ]  MWF or [ ]  TTHS, [ ]  Burning with urination, [ ]  Difficulty urinating Skin: [ ]  Rashes, [ ]  Wounds Psychological: [ ]  Anxiety, [ ]  Depression   Social History History  Substance Use Topics  . Smoking status: Former Smoker -- 2.0 packs/day for 56 years    Types: Cigarettes    Quit date: 03/30/2006  . Smokeless tobacco: Not on file  . Alcohol Use: No     occ    Family History Family History  Problem Relation Age of Onset  . Heart disease Father   . Diabetes Sister   . Anesthesia problems Neg Hx     Allergies  Allergen Reactions  . Tetanus Toxoids Other (See Comments)    In 1958 he got the tetanus shot and "blacked out"    Current Outpatient Prescriptions  Medication Sig Dispense Refill  . albuterol (PROAIR HFA) 108 (90 BASE) MCG/ACT inhaler Inhale 2 puffs into the lungs every 6 (six) hours as needed. For  shortness of breath      . albuterol (PROVENTIL) (2.5 MG/3ML) 0.083% nebulizer solution Take 2.5 mg by nebulization 4 (four) times daily as needed. For shortness of breath      . aspirin 81 MG chewable tablet Chew 81 mg by mouth daily.      [name] atorvastatin (LIPITOR) 40 MG tablet Take 40 mg by mouth daily.      . cetirizine (ZYRTEC) 10 MG tablet Take 10 mg by mouth daily as needed. For runny nose      . cilostazol (PLETAL) 100 MG tablet Take 50 mg by mouth 2 (two) times daily.       . clopidogrel (PLAVIX) 75 MG tablet Take 75 mg by mouth daily.       . Fluticasone-Salmeterol (ADVAIR) 250-50 MCG/DOSE AEPB Inhale 1 puff into the lungs every 12 (twelve) hours.      . folic acid (FOLVITE) 1 MG tablet Take 1 mg by mouth daily.       . furosemide (LASIX) 40 MG tablet Take 40 mg by mouth daily as  needed.       [name] losartan (COZAAR) 25 MG tablet Take 25 mg by mouth daily.      . metoprolol succinate (TOPROL-XL) 25 MG 24 hr tablet Take 25 mg by mouth daily.      . vitamin B-12 (CYANOCOBALAMIN) 1000 MCG tablet Take 1,000 mcg by mouth daily.        Physical Examination  Filed Vitals:   12/18/11 1538  BP: 94/65  Pulse:   Resp:     Body mass index is 26.45 kg/(m^2).  General:  WDWN in NAD Gait: Normal HEENT: WNL Eyes: Pupils equal Pulmonary: normal non-labored breathing , without Rales, rhonchi,  wheezing Cardiac: RRR, without  Murmurs, rubs or gallops; Abdomen: soft, NT, no masses Skin: no rashes, ulcers noted  Vascular Exam Pulses: Left radial pulses 2+ Carotid bruits: Carotid pulses to auscultation no bruits are heard Extremities without ischemic changes, no Gangrene , no cellulitis; no open wounds;  Musculoskeletal: no muscle wasting or atrophy   Neurologic: A&O X 3; Appropriate Affect ; SENSATION: normal; MOTOR FUNCTION:  moving all extremities equally. Speech is fluent/normal  Non-Invasive Vascular Imaging CAROTID DUPLEX 12/18/2011  Right ICA 40 - 59 % stenosis Left ICA 20 - 39 % stenosis   ASSESSMENT/PLAN: Asymptomatic carotid patient with possible subclavian stenosis that may need intervention. The patient will return in 4-6 weeks to discuss this problem with Dr. Myra Gianotti. He will return in one year for repeat carotid duplex. The patient's questions were encouraged and answered, he is in agreement with this plan.  Lauree Chandler ANP   Clinic MD : Imogene Burn

## 2011-12-21 NOTE — Addendum Note (Signed)
Addended by: Dannielle Karvonen on: 12/21/2011 09:38 AM   Modules accepted: Orders

## 2012-01-22 ENCOUNTER — Encounter: Payer: Self-pay | Admitting: Surgery

## 2012-01-25 ENCOUNTER — Ambulatory Visit (INDEPENDENT_AMBULATORY_CARE_PROVIDER_SITE_OTHER): Payer: Medicare Other | Admitting: Surgery

## 2012-01-25 ENCOUNTER — Encounter: Payer: Self-pay | Admitting: Surgery

## 2012-01-25 VITALS — BP 144/69 | HR 84 | Ht 67.0 in | Wt 172.0 lb

## 2012-01-25 DIAGNOSIS — I6529 Occlusion and stenosis of unspecified carotid artery: Secondary | ICD-10-CM

## 2012-01-25 NOTE — Progress Notes (Signed)
[facility]   Patient name: [name] MRN: [id number] DOB: 06-Oct-1931 Sex: male     Chief Complaint  Patient presents with  . Carotid    4-6 wk f/u    HISTORY OF PRESENT ILLNESS: The patient is back today for followup. He is status post right vertebral to carotid artery transposition in May of 2013. This was done in the setting of balance problems. His symptoms are dramatically improved. He is here to discuss possibly treating his known subclavian artery disease. He states that he will occasionally have fatigue in his right arm but this does not bother him and does not affect him on a daily basis.  Past Medical History  Diagnosis Date  . Pulmonary nodule     s/p wedge resction, inflammatory, +MAC  . Emphysema   . Cardiomyopathy     EF 25% by echo 2009  . DVT (deep venous thrombosis)   . CAD (coronary artery disease)     Dr Jacinto Halim every 6 months  . Hypertension   . Peripheral vascular disease   . Myocardial infarction 1986  . Shortness of breath   . COPD (chronic obstructive pulmonary disease)     on O2 constantly  . Keeps losing balance   . Anemia   . Vitamin B 12 deficiency   . Carotid artery occlusion     Past Surgical History  Procedure Date  . Carotid endarterectomy 2009    triple bypass  . Lung surgery 2008    tumor  . Coronary artery bypass graft 2009  . Vascular surgery   . Carotid-vertebral bypass graft 08/20/2011    Procedure: BYPASS GRAFT CAROTID-VERTEBRAL;  Surgeon: Nada Libman, MD;  Location: [facility] OR;  Service: Vascular;  Laterality: Right;  Right CAROTID-VERTEBRAL TRANSPOSITION  . Pr vein bypass graft,[id number]     History   Social History  . Marital Status: Divorced    Spouse Name: N/A    Number of Children: N/A  . Years of Education: N/A   Occupational History  . Retired     Designer, fashion/clothing   Social History Main Topics  . Smoking status: Former Smoker -- 2.0 packs/day for 56 years    Types: Cigarettes    Quit  date: 03/30/2006  . Smokeless tobacco: Never Used  . Alcohol Use: No     occ  . Drug Use: No  . Sexually Active: Not on file   Other Topics Concern  . Not on file   Social History Narrative  . No narrative on file    Family History  Problem Relation Age of Onset  . Heart disease Father   . Diabetes Sister   . Anesthesia problems Neg Hx     Allergies as of 01/25/2012 - Review Complete 01/25/2012  Allergen Reaction Noted  . Tetanus toxoids Other (See Comments) 04/06/2011    Current Outpatient Prescriptions on File Prior to Visit  Medication Sig Dispense Refill  . albuterol (PROAIR HFA) 108 (90 BASE) MCG/ACT inhaler Inhale 2 puffs into the lungs every 6 (six) hours as needed. For shortness of breath      . albuterol (PROVENTIL) (2.5 MG/3ML) 0.083% nebulizer solution Take 2.5 mg by nebulization 4 (four) times daily as needed. For shortness of breath      . aspirin 81 MG chewable tablet Chew 81 mg by mouth daily.      [name] atorvastatin (LIPITOR) 40 MG tablet Take 40 mg by mouth daily.      . cetirizine (  ZYRTEC) 10 MG tablet Take 10 mg by mouth daily as needed. For runny nose      . cilostazol (PLETAL) 100 MG tablet Take 50 mg by mouth 2 (two) times daily.       . clopidogrel (PLAVIX) 75 MG tablet Take 75 mg by mouth daily.       . Fluticasone-Salmeterol (ADVAIR) 250-50 MCG/DOSE AEPB Inhale 1 puff into the lungs every 12 (twelve) hours.      . folic acid (FOLVITE) 1 MG tablet Take 1 mg by mouth daily.       . furosemide (LASIX) 40 MG tablet Take 40 mg by mouth daily as needed.       [name] losartan (COZAAR) 25 MG tablet Take 25 mg by mouth daily.      . metoprolol succinate (TOPROL-XL) 25 MG 24 hr tablet Take 25 mg by mouth daily.      . vitamin B-12 (CYANOCOBALAMIN) 1000 MCG tablet Take 1,000 mcg by mouth daily.         REVIEW OF SYSTEMS: No changes from prior visit, per the patient  PHYSICAL EXAMINATION:   Vital signs are BP 144/69  Pulse 84  Ht 5\' 7"  (1.702 m)  Wt 172 lb  (78.019 kg)  BMI 26.94 kg/m2  SpO2 96% General: The patient appears their stated age. HEENT:  No gross abnormalities Pulmonary:  Non labored breathing Musculoskeletal: There are no major deformities. Neurologic: No focal weakness or paresthesias are detected, Skin: There are no ulcer or rashes noted. Psychiatric: The patient has normal affect. Cardiovascular: There is a regular rate and rhythm without significant murmur appreciated. No carotid bruits right neck incision is well-healed   Diagnostic Studies None  Assessment: Status post right vertebral to carotid artery transposition Plan: I feel the patient's symptoms of balance aren't dramatically improved. His activity level has increased and his fear of falling has nearly resolved. Specifically, regards to his right subclavian stenosis, I do not feel that his symptoms are severe enough to proceed with stenting of this area. We discussed what to look out for, and if these get worse we can consider stenting the subclavian however I would not recommend this at this time  V. Charlena Cross, M.D. [facility] [place] Office: [phone] Pager:  [phone]

## 2012-03-11 ENCOUNTER — Telehealth: Payer: Self-pay | Admitting: Pulmonary Disease

## 2012-03-11 MED ORDER — FLUTICASONE-SALMETEROL 250-50 MCG/DOSE IN AEPB: 1.0000 | INHALATION_SPRAY | Freq: Two times a day (BID) | RESPIRATORY_TRACT | Status: DC

## 2012-03-11 NOTE — Telephone Encounter (Signed)
Called and spoke with pt and he is aware of refills of the advair that has been sent to [facility].  Nothing further is needed.

## 2012-04-06 ENCOUNTER — Ambulatory Visit (INDEPENDENT_AMBULATORY_CARE_PROVIDER_SITE_OTHER): Payer: Medicare Other | Admitting: Pulmonary Disease

## 2012-04-06 ENCOUNTER — Encounter: Payer: Self-pay | Admitting: Pulmonary Disease

## 2012-04-06 VITALS — BP 130/68 | HR 90 | Temp 97.6°F | Ht 67.0 in | Wt 178.8 lb

## 2012-04-06 DIAGNOSIS — J438 Other emphysema: Secondary | ICD-10-CM

## 2012-04-06 NOTE — Assessment & Plan Note (Signed)
The patient overall remained stable since his last visit, and has not had an acute exacerbation.  He did try the tudorza, and although it did not help his breathing during the day, he felt that his breathing was much improved at night while sleeping.  I will give him another sample, and if he feels that it is continuing to help him, he is to fill the prescription.  I also stressed to him the importance of staying active, as well as weight loss.

## 2012-04-06 NOTE — Progress Notes (Signed)
  Subjective:    Patient ID: [name], male    DOB: [birth year], 79 y.o.   MRN: [id number]  HPI Patient comes in today for followup of his known COPD with chronic respiratory failure.  He was tried on [place] last visit, and although he did not see a change in his breathing during the day, he felt it helped his breathing at night significantly enough that his sleep was greatly improved.  He has not had an acute exacerbation pulmonary infection since last visit, and feels that his exertional tolerance is at baseline.   Review of Systems  Constitutional: Negative for fever and unexpected weight change.  HENT: Positive for rhinorrhea. Negative for ear pain, nosebleeds, congestion, sore throat, sneezing, trouble swallowing, dental problem, postnasal drip and sinus pressure.   Eyes: Negative for redness and itching.  Respiratory: Positive for shortness of breath. Negative for cough, chest tightness and wheezing.   Cardiovascular: Negative for palpitations and leg swelling.  Gastrointestinal: Negative for nausea and vomiting.  Genitourinary: Negative for dysuria.  Musculoskeletal: Negative for joint swelling.  Skin: Negative for rash.  Neurological: Negative for headaches.  Hematological: Does not bruise/bleed easily.  Psychiatric/Behavioral: Negative for dysphoric mood. The patient is not nervous/anxious.        Objective:   Physical Exam Well-developed male in no acute distress Nose without purulence or discharge noted Neck without lymphadenopathy or thyromegaly Chest with decreased breath sounds throughout, no wheezes or crackles Cardiac exam is regular rate and rhythm Lower extremities with no significant edema, no cyanosis Alert and oriented, moves all 4 extremities.       Assessment & Plan:

## 2012-04-06 NOTE — Patient Instructions (Addendum)
Let's try tudorza again, and take one inhalation am and pm everyday.  Try the samples again for one month, and will give you a prescription for you to decide if it helps enough to stay on. Continue advair and albuterol as needed. Work on staying active, and losing some weight. followup with me in 6mos if doing well.

## 2012-05-10 ENCOUNTER — Telehealth: Payer: Self-pay | Admitting: Pulmonary Disease

## 2012-05-10 MED ORDER — ACLIDINIUM BROMIDE 400 MCG/ACT IN AEPB: 1.0000 | INHALATION_SPRAY | Freq: Two times a day (BID) | RESPIRATORY_TRACT | Status: DC

## 2012-05-10 NOTE — Telephone Encounter (Signed)
Rx has been sent in, pt is aware. 

## 2012-05-16 ENCOUNTER — Telehealth: Payer: Self-pay | Admitting: Pulmonary Disease

## 2012-05-16 NOTE — Telephone Encounter (Signed)
LMTCB x 1 for the pt. ATC the pharmacy [facility] line was busy. Will try again later.

## 2012-05-16 NOTE — Telephone Encounter (Signed)
The pt and pharmacy have been notified of Tudorza APPROVAL.

## 2012-05-16 NOTE — Telephone Encounter (Signed)
Per pharmacy, [name] needs PA. Call [phone], [facility]. Member # [id number].  Pt has had a trial of Spiriva and this made his breathing worse as stated in COPD overview. [name] has been APPROVED until 03/29/13. LMTCB x 1 for the pt so he can be informed. Pharmacy line was busy.

## 2012-10-04 ENCOUNTER — Encounter: Payer: Self-pay | Admitting: Pulmonary Disease

## 2012-10-04 ENCOUNTER — Ambulatory Visit (INDEPENDENT_AMBULATORY_CARE_PROVIDER_SITE_OTHER): Payer: Medicare Other | Admitting: Pulmonary Disease

## 2012-10-04 VITALS — BP 102/62 | HR 89 | Temp 97.7°F | Ht 67.0 in | Wt 172.6 lb

## 2012-10-04 DIAGNOSIS — J9610 Chronic respiratory failure, unspecified whether with hypoxia or hypercapnia: Secondary | ICD-10-CM

## 2012-10-04 DIAGNOSIS — J438 Other emphysema: Secondary | ICD-10-CM

## 2012-10-04 NOTE — Progress Notes (Signed)
  Subjective:    Patient ID: [name], male    DOB: January 27, 1932, 79 y.o.   MRN: [id number]  HPI Patient comes in today for followup of his severe emphysema with chronic respiratory failure.  He has been staying on his maintenance bronchodilator regimen, as well as his oxygen.  He has both good and bad days, but always does worse during the summer months.  He has not had any acute exacerbation or pulmonary infection since last visit.   Review of Systems  Constitutional: Negative for fever and unexpected weight change.  HENT: Negative for ear pain, nosebleeds, congestion, sore throat, rhinorrhea, sneezing, trouble swallowing, dental problem, postnasal drip and sinus pressure.   Eyes: Negative for redness and itching.  Respiratory: Negative for cough, chest tightness, shortness of breath and wheezing.   Cardiovascular: Negative for palpitations and leg swelling.  Gastrointestinal: Negative for nausea and vomiting.  Genitourinary: Negative for dysuria.  Musculoskeletal: Negative for joint swelling.  Skin: Negative for rash.  Neurological: Negative for headaches.  Hematological: Does not bruise/bleed easily.  Psychiatric/Behavioral: Negative for dysphoric mood. The patient is not nervous/anxious.        Objective:   Physical Exam Well-developed male in no acute distress Nose without purulence or discharge noted Neck without lymphadenopathy or thyromegaly Chest with decreased breath sounds throughout, no active wheezing Cardiac exam with regular rate and rhythm, 2/6 systolic murmur Lower extremities with minimal edema, no cyanosis Alert, oriented, moves all 4 extremities.       Assessment & Plan:

## 2012-10-04 NOTE — Patient Instructions (Addendum)
No change in medications Stay on oxygen, and keep active. followup with me in 6mos.

## 2012-10-04 NOTE — Assessment & Plan Note (Signed)
The patient seems to be maintaining a stable baseline on his current regimen.  He does have good and days, but this is expected given the severity of his lung disease.  I have asked him to stay on his current medications, and to keep working on some type of exercise program.  He will followup with me in 6 months if doing well.

## 2012-12-19 ENCOUNTER — Other Ambulatory Visit: Payer: Medicare Other

## 2012-12-19 ENCOUNTER — Ambulatory Visit: Payer: Medicare Other | Admitting: Neurosurgery

## 2012-12-26 ENCOUNTER — Ambulatory Visit: Payer: Medicare Other | Admitting: Neurosurgery

## 2012-12-26 ENCOUNTER — Other Ambulatory Visit: Payer: Medicare Other

## 2013-04-05 ENCOUNTER — Encounter: Payer: Self-pay | Admitting: Pulmonary Disease

## 2013-04-05 ENCOUNTER — Encounter (INDEPENDENT_AMBULATORY_CARE_PROVIDER_SITE_OTHER): Payer: Self-pay

## 2013-04-05 ENCOUNTER — Ambulatory Visit (INDEPENDENT_AMBULATORY_CARE_PROVIDER_SITE_OTHER): Payer: Medicare Other | Admitting: Pulmonary Disease

## 2013-04-05 VITALS — BP 118/60 | HR 93 | Temp 97.8°F | Ht 66.0 in | Wt 180.2 lb

## 2013-04-05 DIAGNOSIS — J9610 Chronic respiratory failure, unspecified whether with hypoxia or hypercapnia: Secondary | ICD-10-CM

## 2013-04-05 DIAGNOSIS — J439 Emphysema, unspecified: Secondary | ICD-10-CM

## 2013-04-05 DIAGNOSIS — J438 Other emphysema: Secondary | ICD-10-CM

## 2013-04-05 NOTE — Assessment & Plan Note (Signed)
The patient has seen a decline in his breathing over the last few months, but he is no longer using tudorza. I have documented in the chart that he had a positive response to tudorza, and we'll therefore restart this medication to see if that will help his breathing this time. If he has issues with the cost, we'll see if he qualifies for the patient assistance program.

## 2013-04-05 NOTE — Patient Instructions (Signed)
Stay on advair Let us try tudorza again, one inhalation each am and pm.  Let me know at the end of 4 weeks if you think this is helping your. Work on weight loss and a conditioning program followup with me again in 35mos.

## 2013-04-05 NOTE — Progress Notes (Signed)
   Subjective:    Patient ID: [name], male    DOB: 07/15/1931, 80 y.o.   MRN: [id number]  HPI Patient comes in today for followup of his known COPD with chronic respiratory failure.  He is continuing on Advair, but for some reason he is discontinued [place]. He has seen a decline in his breathing over the last few months, but has not had an acute exacerbation or pulmonary infection. He admits that he has not been very active, and his weight is up 8 pounds since the last visit.   Review of Systems  Constitutional: Negative for fever and unexpected weight change.  HENT: Negative for congestion, dental problem, ear pain, nosebleeds, postnasal drip, rhinorrhea, sinus pressure, sneezing, sore throat and trouble swallowing.   Eyes: Negative for redness and itching.  Respiratory: Positive for shortness of breath. Negative for cough, chest tightness and wheezing.   Cardiovascular: Negative for palpitations and leg swelling.  Gastrointestinal: Negative for nausea and vomiting.  Genitourinary: Negative for dysuria.  Musculoskeletal: Negative for joint swelling.  Skin: Negative for rash.  Neurological: Negative for headaches.  Hematological: Does not bruise/bleed easily.  Psychiatric/Behavioral: Negative for dysphoric mood. The patient is not nervous/anxious.        Objective:   Physical Exam Well-developed male in no acute distress Nose without purulence or discharge noted Neck without lymphadenopathy or thyromegaly Chest with decreased breath sounds throughout, no active wheezing Cardiac exam with regular rate and rhythm Lower extremities without edema, no cyanosis Alert and oriented, moves all 4 extremities.       Assessment & Plan:

## 2013-05-05 ENCOUNTER — Telehealth: Payer: Self-pay | Admitting: Pulmonary Disease

## 2013-05-05 NOTE — Telephone Encounter (Signed)
ATC, NA and no option to leave a msg, WCB 

## 2013-05-05 NOTE — Telephone Encounter (Signed)
Spoke with pt. Calling with update on tudurza. He reports this helps with him getting mucus up but does not really notice any different in his breathing. He has noticed after 1 week of being on this medication the wheezing/chest tx was not as bad. He did stop this medication for a couple days once he started it bc he was having sharp pains in his chest. h thought it was related to the [place]. He restarted this medication 3 days ago and so far no chest pains. Please advise [place] thanks

## 2013-05-05 NOTE — Telephone Encounter (Signed)
If the pt feels he is doing better in some way with tudorza, would continue.  If he is back on med with no chest pain, would continue.

## 2013-05-08 NOTE — Telephone Encounter (Signed)
Pt advised. Jennifer Castillo, CMA  

## 2013-06-13 ENCOUNTER — Other Ambulatory Visit: Payer: Self-pay | Admitting: Pulmonary Disease

## 2013-06-15 NOTE — Telephone Encounter (Signed)
Pt call on status of refill.[name]

## 2013-10-04 ENCOUNTER — Ambulatory Visit (INDEPENDENT_AMBULATORY_CARE_PROVIDER_SITE_OTHER): Payer: Medicare Other | Admitting: Pulmonary Disease

## 2013-10-04 ENCOUNTER — Encounter: Payer: Self-pay | Admitting: Pulmonary Disease

## 2013-10-04 VITALS — BP 120/70 | HR 92 | Temp 96.9°F | Ht 66.0 in | Wt 175.4 lb

## 2013-10-04 DIAGNOSIS — J438 Other emphysema: Secondary | ICD-10-CM

## 2013-10-04 DIAGNOSIS — R0902 Hypoxemia: Secondary | ICD-10-CM

## 2013-10-04 DIAGNOSIS — J9610 Chronic respiratory failure, unspecified whether with hypoxia or hypercapnia: Secondary | ICD-10-CM

## 2013-10-04 DIAGNOSIS — J9611 Chronic respiratory failure with hypoxia: Secondary | ICD-10-CM

## 2013-10-04 NOTE — Progress Notes (Signed)
   Subjective:    Patient ID: [name], male    DOB: 22-Feb-1932, 80 y.o.   MRN: [id number]  HPI The patient comes in today for followup of his known emphysema with chronic respiratory failure. He is maintaining on his bronchodilator regimen, and feels that his breathing is at baseline. He still has good and bad days, but overall he feels he is doing okay. He has had no recent chest infection or acute exacerbation.   Review of Systems  Constitutional: Negative for fever and unexpected weight change.  HENT: Negative for congestion, dental problem, ear pain, nosebleeds, postnasal drip, rhinorrhea, sinus pressure, sneezing, sore throat and trouble swallowing.   Eyes: Negative for redness and itching.  Respiratory: Positive for shortness of breath. Negative for cough, chest tightness and wheezing.   Cardiovascular: Negative for palpitations and leg swelling.  Gastrointestinal: Negative for nausea and vomiting.  Genitourinary: Negative for dysuria.  Musculoskeletal: Negative for joint swelling.  Skin: Negative for rash.  Neurological: Negative for headaches.  Hematological: Does not bruise/bleed easily.  Psychiatric/Behavioral: Negative for dysphoric mood. The patient is not nervous/anxious.        Objective:   Physical Exam Well-developed male in no acute distress Nose without purulence or discharge noted Neck without lymphadenopathy or thyromegaly Chest with decreased breath sounds throughout, no active wheezing Cardiac exam with regular rate and rhythm Lower extremities with no significant edema, no cyanosis Alert and oriented, moves all 4 extremities.       Assessment & Plan:

## 2013-10-04 NOTE — Patient Instructions (Signed)
No change in medications Continue to stay as active as possible Try AYR saline nasal jelly to see if helps dryness.  This can be purchased at [facility]. followup with me again in 57mos, but call if having worsening breathing issues.

## 2013-10-04 NOTE — Assessment & Plan Note (Signed)
The patient appears to be at a stable baseline on his current COPD regimen. I've asked him to continue on this as well as his oxygen, and to keep staying as active as possible. I will see him back in 6 months to check on his progress.

## 2013-11-08 ENCOUNTER — Telehealth: Payer: Self-pay | Admitting: Pulmonary Disease

## 2013-11-08 MED ORDER — ALBUTEROL SULFATE HFA 108 (90 BASE) MCG/ACT IN AERS: 2.0000 | INHALATION_SPRAY | Freq: Four times a day (QID) | RESPIRATORY_TRACT | Status: DC | PRN

## 2013-11-08 NOTE — Telephone Encounter (Signed)
Called spoke with pt. He needs refill on his proair inhaler sent to [facility]. i have done so. Nothing further needed

## 2014-01-08 ENCOUNTER — Telehealth: Payer: Self-pay | Admitting: Pulmonary Disease

## 2014-01-08 DIAGNOSIS — J438 Other emphysema: Secondary | ICD-10-CM

## 2014-01-08 NOTE — Telephone Encounter (Signed)
Per pt, someone from [facility] came to his home last wk and performed a POC titration.  He would like Dr. Gwenette Greet to look at results and advise if he would be a candidate for a POC to replace his liquid o2.    lmomtcb for [place], [id number], with [facility] to obtain results

## 2014-01-09 NOTE — Telephone Encounter (Signed)
Spoke with [name] - she is working on this.  Will await results.

## 2014-01-09 NOTE — Telephone Encounter (Signed)
I checked both fax machines and asked Mindy and see nothing on this pt  Wwill await for her to deliver this tomorrow Will forward to [place] to f/u on 10/14 Thanks!

## 2014-01-09 NOTE — Telephone Encounter (Signed)
[name] faxed the report over, she will hand deliver the report tomorrow also.  [phone]

## 2014-01-10 NOTE — Telephone Encounter (Signed)
Results received from [facility] and given to [place]. Dr. Gwenette Greet, pls advise.  Thank you.

## 2014-01-10 NOTE — Telephone Encounter (Signed)
Called made pt aware order placed. Staff message sent to [facility].

## 2014-01-10 NOTE — Telephone Encounter (Signed)
Please send order to DME saying it is ok to put him on POC with flow of 3-5 liters with exertion. Let pt know that we have ok'ed this.

## 2014-03-08 ENCOUNTER — Encounter (HOSPITAL_COMMUNITY): Payer: Self-pay | Admitting: Cardiology

## 2014-04-06 ENCOUNTER — Ambulatory Visit (INDEPENDENT_AMBULATORY_CARE_PROVIDER_SITE_OTHER): Payer: Medicare Other | Admitting: Pulmonary Disease

## 2014-04-06 ENCOUNTER — Encounter: Payer: Self-pay | Admitting: Pulmonary Disease

## 2014-04-06 VITALS — BP 130/68 | HR 92 | Temp 97.6°F | Ht 66.0 in | Wt 176.2 lb

## 2014-04-06 DIAGNOSIS — J438 Other emphysema: Secondary | ICD-10-CM

## 2014-04-06 DIAGNOSIS — J9611 Chronic respiratory failure with hypoxia: Secondary | ICD-10-CM

## 2014-04-06 MED ORDER — PREDNISONE 10 MG PO TABS: ORAL_TABLET | ORAL | Status: DC

## 2014-04-06 NOTE — Progress Notes (Signed)
   Subjective:    Patient ID: [name], male    DOB: Aug 12, 1931, 81 y.o.   MRN: [id number]  HPI The patient comes in today for follow-up of his severe COPD with chronic respiratory failure. He is staying on his bronchodilator regimen as well as his oxygen, and overall has done well. Most recently, he has seen some decline in his exertional tolerance, but part of the issue was his portable oxygen concentrator not working at temperatures less than 40. He denies any chest congestion, dyspnea at rest, or cough.   Review of Systems  Constitutional: Negative for fever and unexpected weight change.  HENT: Negative for congestion, dental problem, ear pain, nosebleeds, postnasal drip, rhinorrhea, sinus pressure, sneezing, sore throat and trouble swallowing.   Eyes: Negative for redness and itching.  Respiratory: Positive for shortness of breath. Negative for cough, chest tightness and wheezing.   Cardiovascular: Negative for palpitations and leg swelling.  Gastrointestinal: Negative for nausea and vomiting.  Genitourinary: Negative for dysuria.  Musculoskeletal: Negative for joint swelling.  Skin: Negative for rash.  Neurological: Negative for headaches.  Hematological: Does not bruise/bleed easily.  Psychiatric/Behavioral: Negative for dysphoric mood. The patient is not nervous/anxious.        Objective:   Physical Exam Well-developed male in no acute distress Nose without purulence or discharge noted Neck without lymphadenopathy or thyromegaly Chest with decreased breath sounds throughout, no wheezes or crackles Cardiac exam with distant heart sounds, no murmurs Lower extremities without edema, no cyanosis Alert and oriented, moves all 4 extremities.       Assessment & Plan:

## 2014-04-06 NOTE — Patient Instructions (Signed)
Will give you a prescription for prednisone to hold.  If you have worsening breathing, go ahead and fill and take the prescription.   No change in usual meds. Try and stay as active as possible. followup with me again in 71mos, but call if having worsening breathing issues.

## 2014-04-06 NOTE — Assessment & Plan Note (Signed)
The patient overall feels that his breathing is near his usual baseline, but a little worse most recently. He is not having any chest congestion, cough, or mucus production. He feels that he breathes very well at rest, but has issues of varying severity day-to-day with exertion. I have asked him to continue on his usual bronchodilator regimen, as well as his oxygen. Will give him an 8 day course of prednisone to hold, and to only take if he has worsening symptoms.

## 2014-10-05 ENCOUNTER — Ambulatory Visit: Payer: Medicare Other | Admitting: Pulmonary Disease

## 2014-10-05 ENCOUNTER — Ambulatory Visit (INDEPENDENT_AMBULATORY_CARE_PROVIDER_SITE_OTHER)
Admission: RE | Admit: 2014-10-05 | Discharge: 2014-10-05 | Disposition: A | Discharge status: Home / Self Care | Payer: Medicare Other | Source: Ambulatory Visit | Attending: Internal Medicine | Admitting: Internal Medicine

## 2014-10-05 ENCOUNTER — Encounter: Payer: Self-pay | Admitting: Internal Medicine

## 2014-10-05 ENCOUNTER — Ambulatory Visit (INDEPENDENT_AMBULATORY_CARE_PROVIDER_SITE_OTHER): Payer: Medicare Other | Admitting: Internal Medicine

## 2014-10-05 VITALS — BP 104/60 | HR 98 | Ht 67.0 in | Wt 167.8 lb

## 2014-10-05 DIAGNOSIS — J449 Chronic obstructive pulmonary disease, unspecified: Secondary | ICD-10-CM | POA: Diagnosis not present

## 2014-10-05 DIAGNOSIS — J9611 Chronic respiratory failure with hypoxia: Secondary | ICD-10-CM | POA: Diagnosis not present

## 2014-10-05 MED ORDER — MOMETASONE FURO-FORMOTEROL FUM 200-5 MCG/ACT IN AERO: INHALATION_SPRAY | RESPIRATORY_TRACT | Status: DC

## 2014-10-05 NOTE — Patient Instructions (Addendum)
Try dulera 200 Take 2 puffs first thing in am and then another 2 puffs about 12 hours later instead of advair   Please remember to go to the x-ray department downstairs for your tests - we will call you with the results when they are available.    Please schedule a follow up office visit in 6 weeks, call sooner if needed

## 2014-10-05 NOTE — Progress Notes (Signed)
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 Follow-up    Former pt of Dr. Gwenette Greet. Pt states his breathing seems worse since his last visit. He states that he is not using rescue inhaler or neb.      Doe x Mailbox and back sometimes s 02/ quite variable doe/ never at rest or sleeping / walks slow pace = mmrc 2  Never use 02 sitting/  Sleep on 3lpm /walking on 3lpm  No obvious patterns (x worse in heat/humidity) day to day or daytime variabilty or assoc chronic cough or cp or chest tightness, subjective wheeze overt sinus or hb symptoms. No unusual exp hx or h/o childhood pna/ asthma or knowledge of premature birth.  Sleeping ok without nocturnal  or early am exacerbation  of respiratory  c/o's or need for noct saba. Also denies any obvious fluctuation of symptoms with weather or environmental changes or other aggravating or alleviating factors except as outlined above   Current Medications, Allergies, Complete Past Medical History, Past Surgical History, Family History, and Social History were reviewed in [employer] record.  ROS  The following are not active complaints unless bolded sore throat, dysphagia, dental problems, itching, sneezing,  nasal congestion or excess/ purulent secretions, ear ache,   fever, chills, sweats, unintended wt loss, pleuritic or exertional cp, hemoptysis,  orthopnea pnd or leg swelling, presyncope, palpitations, abdominal pain, anorexia, nausea, vomiting, diarrhea  or change in bowel or urinary habits, change in stools or urine, dysuria,hematuria,  rash, arthralgias, visual complaints, headache, numbness weakness or ataxia or problems with walking or coordination,  change in  mood/affect or memory.         Review of Systems     Objective:   Physical Exam amb slt hoarse wm nad  Wt Readings from Last 3 Encounters:  10/05/14 167 lb 12.8 oz (76.114 kg)  04/06/14 176 lb 3.2 oz (79.924 kg)  10/04/13 175 lb 6.4 oz (79.561 kg)    Vital signs reviewed   HEENT: nl dentition, turbinates, and orophanx. Nl external ear canals without cough reflex   NECK :  without JVD/Nodes/TM/ nl carotid upstrokes bilaterally   LUNGS: no acc muscle use, distant bs bilaterally distant wheeze on exp    CV:  RRR  no s3 or murmur or increase in P2, no edema   ABD:  soft and nontender with late  insp hoover's  in the supine position. No bruits or organomegaly, bowel sounds nl  MS:  warm without deformities, calf tenderness, cyanosis or clubbing  SKIN: warm and dry without lesions    NEURO:  alert, approp, no deficits   CXR PA and Lateral:   10/05/2014 :     I personally reviewed images and agree with radiology impression as follows:    1. Prior CABG. 2. Basal pleural-parenchymal thickening consistent with scarring. COPD.         Assessment:

## 2014-10-06 ENCOUNTER — Encounter: Payer: Self-pay | Admitting: Internal Medicine

## 2014-10-06 NOTE — Assessment & Plan Note (Addendum)
PFT's 2008:  FEV1 1.17 (46%), ratio 43, TLC nl, DLCO 55% Spiriva trial:  "made me worse".  Trial of tudorza 09/2011:  Less sleep disturbance, not  better doe     The proper method of use, as well as anticipated side effects, of a metered-dose inhaler are discussed and demonstrated to the patient. Improved effectiveness after extensive coaching during this visit to a level of approximately   75% > try dulera 200   I had an extended discussion with the patient reviewing all relevant studies completed to date and  lasting 15 to 20 minutes of a 25 minute visit    DDX of  difficult airways management all start with A and  include Adherence, Ace Inhibitors, Acid Reflux, Active Sinus Disease, Alpha 1 Antitripsin deficiency, Anxiety masquerading as Airways dz,  ABPA,  allergy(esp in young), Aspiration (esp in elderly), Adverse effects of meds,  Active smokers, A bunch of PE's (a small clot burden can't cause this syndrome unless there is already severe underlying pulm or vascular dz with poor reserve) plus two Bs  = Bronchiectasis and Beta blocker use..and one C= CHF  Adherence is always the initial "prime suspect" and is a multilayered concern that requires a "trust but verify" approach in every patient - starting with knowing how to use medications, especially inhalers, correctly, keeping up with refills and understanding the fundamental difference between maintenance and prns vs those medications only taken for a very short course and then stopped and not refilled.  - see above re approp use of mdi  ? Adverse effects of dpi > first try off advair then regroup.   Each maintenance medication was reviewed in detail including most importantly the difference between maintenance and prns and under what circumstances the prns are to be triggered using an action plan format that is not reflected in the computer generated alphabetically organized AVS.    Please see instructions for details which were reviewed in  writing and the patient given a copy highlighting the part that I personally wrote and discussed at today's ov.

## 2014-10-06 NOTE — Assessment & Plan Note (Signed)
10/05/2014   Walked 3lpm x one lap @ 185 stopped due to  sats 86% at nl pace but no sob  Not Adequate control on present rx, reviewed > no change in rx needed  For now will try to improve airflow first then repeat ? Do amb 02 titration next

## 2014-10-08 ENCOUNTER — Telehealth: Payer: Self-pay | Admitting: Internal Medicine

## 2014-10-08 NOTE — Progress Notes (Signed)
Quick Note:  LMTCB ______ 

## 2014-10-08 NOTE — Telephone Encounter (Signed)
Patient notified of CXR results. Nothing further needed.  

## 2014-11-16 ENCOUNTER — Ambulatory Visit: Payer: Medicare Other | Admitting: Internal Medicine

## 2014-11-21 ENCOUNTER — Encounter: Payer: Self-pay | Admitting: Internal Medicine

## 2014-11-21 ENCOUNTER — Ambulatory Visit (INDEPENDENT_AMBULATORY_CARE_PROVIDER_SITE_OTHER): Payer: Medicare Other | Admitting: Internal Medicine

## 2014-11-21 VITALS — BP 122/64 | HR 100 | Ht 67.0 in | Wt 168.4 lb

## 2014-11-21 DIAGNOSIS — J449 Chronic obstructive pulmonary disease, unspecified: Secondary | ICD-10-CM

## 2014-11-21 DIAGNOSIS — J9611 Chronic respiratory failure with hypoxia: Secondary | ICD-10-CM

## 2014-11-21 MED ORDER — BUDESONIDE-FORMOTEROL FUMARATE 160-4.5 MCG/ACT IN AERO: INHALATION_SPRAY | RESPIRATORY_TRACT | Status: DC

## 2014-11-21 NOTE — Patient Instructions (Signed)
Stop advair and dulera for now   Plan A = Automatic = symbicort 160 Take 2 puffs first thing in am and then another 2 puffs about 12 hours later   Plan B = Backup Only use your albuterol as a rescue medication to be used if you can't catch your breath by resting or doing a relaxed purse lip breathing pattern.  - The less you use it, the better it will work when you need it. - Ok to use up to 2 puffs  every 4 hours if you must but call for immediate appointment if use goes up over your usual need - Don't leave home without it !!  (think of it like the spare tire for your car)   Plan C = Crisis Only use your nebulizer albuterol as a backup to plan B (use proair first and if it doesn't work, then use the nebulizer)   Please schedule a follow up office visit in 6 weeks, call sooner if needed with pfts on return

## 2014-11-21 NOTE — Progress Notes (Signed)
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 Follow-up    Former pt of Dr. Gwenette Greet. Pt states his breathing seems worse since his last visit. He states that he is not using rescue inhaler or neb.      Doe x Mailbox and back sometimes s 02/ quite variable doe/ never at rest or sleeping / walks slow pace = mmrc 2  Never use 02 sitting/   Sleep on 3lpm /walking on 3lpm rec Try dulera 200 Take 2 puffs first thing in am and then another 2 puffs about 12 hours later instead of advair      11/21/2014 f/u ov/Caley Ciaramitaro re: GOLD III COPD  Chief Complaint  Patient presents with  . Follow-up    Pt states his breathing is about the same.  He states Ruthe Mannan works better than the Advair but had trouble with insurance approving med.    doe continues MMRC =2 even on 02     No obvious patterns (x worse in heat/humidity) day to day or daytime variabilty or assoc chronic cough or cp or chest tightness, subjective wheeze overt sinus or hb symptoms. No unusual exp hx or h/o childhood pna/ asthma or knowledge of premature birth.  Sleeping ok without nocturnal  or early am exacerbation  of respiratory  c/o's or need for noct saba. Also denies any obvious fluctuation of symptoms with weather or environmental changes or other aggravating or alleviating factors except as outlined above   Current Medications, Allergies, Complete Past Medical History, Past Surgical History, Family History, and Social History were reviewed in [employer] record.  ROS  The following are not active complaints unless bolded sore throat, dysphagia, dental problems, itching, sneezing,  nasal congestion or  excess/ purulent secretions, ear ache,   fever, chills, sweats, unintended wt loss, pleuritic or exertional cp, hemoptysis,  orthopnea pnd or leg swelling, presyncope, palpitations, abdominal pain, anorexia, nausea, vomiting, diarrhea  or change in bowel or urinary habits, change in stools or urine, dysuria,hematuria,  rash, arthralgias, visual complaints, headache, numbness weakness or ataxia or problems with walking or coordination,  change in mood/affect or memory.              Objective:  Physical Exam   amb slt hoarse wm nad   11/21/2014        168  Wt Readings from Last 3 Encounters:  10/05/14 167 lb 12.8 oz (76.114 kg)  04/06/14 176 lb 3.2 oz (79.924 kg)  10/04/13 175 lb 6.4 oz (79.561 kg)    Vital signs reviewed   HEENT: full dentures/ nl  turbinates, and orophanx. Nl external ear canals without cough reflex   NECK :  without JVD/Nodes/TM/ nl carotid upstrokes bilaterally   LUNGS: no acc muscle use, distant bs bilaterally distant wheeze on exp    CV:  RRR  no s3 or murmur or increase in P2, no edema   ABD:  soft and nontender with late  insp hoover's  in the supine position. No bruits or organomegaly, bowel sounds nl  MS:  warm without deformities, calf tenderness, cyanosis or clubbing  SKIN:  warm and dry without lesions    NEURO:  alert, approp, no deficits   CXR PA and Lateral:   10/05/2014 :     I personally reviewed images and agree with radiology impression as follows:    1. Prior CABG. 2. Basal pleural-parenchymal thickening consistent with scarring. COPD.         Assessment:

## 2014-11-22 ENCOUNTER — Encounter: Payer: Self-pay | Admitting: Internal Medicine

## 2014-11-22 NOTE — Assessment & Plan Note (Signed)
PFT's 2008:  FEV1 1.17 (46%), ratio 43, TLC nl, DLCO 55% Spiriva trial:  "made me worse".  Trial of tudorza 09/2011:  Less sleep disturbance, not better doe  - 10/05/2014   try dulera 200 > insurance would not pay, changed back to advair > worse 11/21/2014  He has  severe COPD and is clearly proven he doesn't do well on the DPI as he does on the [facility] format probably because so much of the DPI deposited in the wrong place, namely the upper airway  Since he has [place] health care they should improve Symbicort at a lower tier co-pay and I recommended he start Symbicort 162 puffs every 12 hours.  The proper method of use, as well as anticipated side effects, of a metered-dose inhaler are discussed and demonstrated to the patient. Improved effectiveness after extensive coaching during this visit to a level of approximately  75% limited by short Ti   I had an extended discussion with the patient reviewing all relevant studies completed to date and  lasting 15 to 20 minutes of a 25 minute visit    Each maintenance medication was reviewed in detail including most importantly the difference between maintenance and prns and under what circumstances the prns are to be triggered using an action plan format that is not reflected in the computer generated alphabetically organized AVS.    Please see instructions for details which were reviewed in writing and the patient given a copy highlighting the part that I personally wrote and discussed at today's ov.

## 2014-11-22 NOTE — Assessment & Plan Note (Signed)
10/05/2014   Walked 3lpm x one lap @ 185 stopped due to  sats 86% at nl pace but no sob  11/21/2014  Only walked about 10 ft RA befor desat so rec 3lpm 24/7 except at rest

## 2014-12-10 ENCOUNTER — Other Ambulatory Visit: Payer: Self-pay | Admitting: Internal Medicine

## 2014-12-10 MED ORDER — ALBUTEROL SULFATE HFA 108 (90 BASE) MCG/ACT IN AERS: 2.0000 | INHALATION_SPRAY | Freq: Four times a day (QID) | RESPIRATORY_TRACT | Status: DC | PRN

## 2015-01-03 ENCOUNTER — Ambulatory Visit (INDEPENDENT_AMBULATORY_CARE_PROVIDER_SITE_OTHER): Payer: Medicare Other | Admitting: Internal Medicine

## 2015-01-03 ENCOUNTER — Encounter: Payer: Self-pay | Admitting: Internal Medicine

## 2015-01-03 VITALS — BP 122/72 | HR 98 | Ht 66.0 in | Wt 172.0 lb

## 2015-01-03 DIAGNOSIS — Z23 Encounter for immunization: Secondary | ICD-10-CM

## 2015-01-03 DIAGNOSIS — J9611 Chronic respiratory failure with hypoxia: Secondary | ICD-10-CM | POA: Diagnosis not present

## 2015-01-03 DIAGNOSIS — J449 Chronic obstructive pulmonary disease, unspecified: Secondary | ICD-10-CM | POA: Diagnosis not present

## 2015-01-03 LAB — PULMONARY FUNCTION TEST
DL/VA % pred: 46 %
DL/VA: 2.02 ml/min/mmHg/L
DLCO unc % pred: 27 %
DLCO unc: 7.5 ml/min/mmHg
FEF 25-75 Post: 0.35 L/sec
FEF 25-75 Pre: 0.36 L/sec
FEF2575-%Change-Post: -4 %
FEF2575-%PRED-POST: 22 %
FEF2575-%Pred-Pre: 23 %
FEV1-%CHANGE-POST: -1 %
FEV1-%PRED-PRE: 39 %
FEV1-%Pred-Post: 39 %
FEV1-Post: 0.9 L
FEV1-Pre: 0.91 L
FEV1FVC-%Change-Post: -3 %
FEV1FVC-%Pred-Pre: 61 %
FEV6-%Change-Post: 2 %
FEV6-%PRED-POST: 65 %
FEV6-%PRED-PRE: 63 %
FEV6-POST: 2 L
FEV6-PRE: 1.95 L
FEV6FVC-%CHANGE-POST: 0 %
FEV6FVC-%PRED-PRE: 101 %
FEV6FVC-%Pred-Post: 101 %
FVC-%CHANGE-POST: 2 %
FVC-%PRED-POST: 64 %
FVC-%Pred-Pre: 63 %
FVC-Post: 2.14 L
FVC-Pre: 2.09 L
PRE FEV6/FVC RATIO: 93 %
Post FEV1/FVC ratio: 42 %
Post FEV6/FVC ratio: 94 %
Pre FEV1/FVC ratio: 44 %
RV % PRED: 130 %
RV: 3.21 L
TLC % pred: 91 %
TLC: 5.7 L

## 2015-01-03 NOTE — Patient Instructions (Addendum)
No change in meds but Work on perfecting  inhaler technique:  relax and gently blow all the way out then take a nice smooth deep breath back in, triggering the inhaler at same time you start breathing in.  Hold for up to 5 seconds if you can. Blow out thru nose. Rinse and gargle with water when done      Please schedule a follow up visit in 3 months but call sooner if needed

## 2015-01-03 NOTE — Progress Notes (Signed)
PFT done today. 

## 2015-01-03 NOTE — Progress Notes (Signed)
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 Follow-up    Former pt of Dr. Gwenette Greet. Pt states his breathing seems worse since his last visit. He states that he is not using rescue inhaler or neb.      Doe x Mailbox and back sometimes s 02/ quite variable doe/ never at rest or sleeping / walks slow pace = mmrc 2  Never use 02 sitting/   Sleep on 3lpm /walking on 3lpm rec Try dulera 200 Take 2 puffs first thing in am and then another 2 puffs about 12 hours later instead of advair      11/21/2014 f/u ov/[name] re: GOLD III COPD  Chief Complaint  Patient presents with  . Follow-up    Pt states his breathing is about the same.  He states Ruthe Mannan works better than the Advair but had trouble with insurance approving med.    doe continues MMRC =2 even on 02  rec Stop advair and dulera for now  Plan A = Automatic = symbicort 160 Take 2 puffs first thing in am and then another 2 puffs about 12 hours later  Plan B = Backup Only use your albuterol as a rescue medication  Plan C = Crisis Only use your nebulizer albuterol as a backup to plan B (use proair first and if it doesn't work, then use the nebulizer)     01/03/2015  f/u ov/[name] re: GOLD IV copd  / 02 dep Chief Complaint  Patient presents with  . Follow-up    COPD. PFT completed today. Pt states that since the weather is getting cooler his breathing seems better. Pt denies cough/wheeze/CP/tightness. Pt c/o constant SOB even with minimal exertion. Pt states that he uses his albuterol HFA twice a day.   Able to walk at [facility] on 02 slow  pace with cart   No obvious day to day or daytime variabilty or assoc chronic cough or cp or  chest tightness, subjective wheeze overt sinus or hb symptoms. No unusual exp hx or h/o childhood pna/ asthma or knowledge of premature birth.  Sleeping ok without nocturnal  or early am exacerbation  of respiratory  c/o's or need for noct saba. Also denies any obvious fluctuation of symptoms with weather or environmental changes or other aggravating or alleviating factors except as outlined above   Current Medications, Allergies, Complete Past Medical History, Past Surgical History, Family History, and Social History were reviewed in [employer] record.  ROS  The following are not active complaints unless bolded sore throat, dysphagia, dental problems, itching, sneezing,  nasal congestion or excess/ purulent secretions, ear ache,   fever, chills, sweats, unintended wt loss, pleuritic or exertional cp, hemoptysis,  orthopnea pnd or leg swelling, presyncope, palpitations, abdominal pain, anorexia, nausea, vomiting, diarrhea  or change in bowel or urinary habits, change in stools or urine, dysuria,hematuria,  rash, arthralgias, visual complaints, headache, numbness weakness or ataxia or problems with walking or coordination,  change in mood/affect or memory.           Objective:  Physical Exam   amb wm nad on 02  11/21/2014        168 > 01/03/2015    172  Wt Readings from Last 3 Encounters:  10/05/14 167 lb 12.8 oz (76.114 kg)  04/06/14 176 lb 3.2 oz (79.924 kg)  10/04/13 175 lb 6.4 oz (79.561 kg)    Vital signs reviewed   HEENT: full dentures/ nl  turbinates, and orophanx. Nl external ear canals without cough reflex   NECK :  without JVD/Nodes/TM/ nl carotid upstrokes bilaterally   LUNGS: no acc muscle use, decreased  bs bilaterally distant wheeze on exp and hyperresonant to percussion   CV:  RRR  no s3 or murmur or increase in P2, no edema   ABD:  soft and nontender with late insp hoover's  in the supine position. No bruits or organomegaly, bowel sounds  nl  MS:  warm without deformities, calf tenderness, cyanosis or clubbing  SKIN: warm and dry without lesions    NEURO:  alert, approp, no deficits   CXR PA and Lateral:   10/05/2014 :     I personally reviewed images and agree with radiology impression as follows:    1. Prior CABG. 2. Basal pleural-parenchymal thickening consistent with scarring. COPD.         Assessment:

## 2015-01-04 NOTE — Assessment & Plan Note (Signed)
10/05/2014   Walked 3lpm x one lap @ 185 stopped due to  sats 86% at nl pace but no sob  11/21/2014  Only walked about 10 ft RA before desat   01/03/2015  rec 3lpm 24/7 except at rest

## 2015-01-04 NOTE — Assessment & Plan Note (Addendum)
PFT's 2008:  FEV1 1.17 (46%), ratio 43, TLC nl, DLCO 55% Spiriva trial:  "made me worse".  Trial of tudorza 09/2011:  Less sleep disturbance, not better doe  - 10/05/2014   try dulera 200 > insurance would not pay, changed back to advair > worse 11/21/2014 - 11/21/2014   try symbicort 160 2bid  - PFT's  01/03/2015  FEV1 0.90 (39 % ) ratio 42  p no % improvement from saba with DLCO  27 % corrects to 46 % for alv volume    The proper method of use, as well as anticipated side effects, of a metered-dose inhaler are discussed and demonstrated to the patient. Improved effectiveness after extensive coaching during this visit to a level of approximately  75%   So despite suboptimal HFA technique he continues to have reasoable level of activity tol despite evidence of  GOLD III  Severity COPD  status post remote smoking cessation (though worse x 8 year comparison) .  He probably  would benefit from a LAMA if he could tolerate it. I suspect his previous reaction to Spiriva was based on the dry powder to affect  and have encouraged him to consider the respimat form.   I had an extended discussion with the patient reviewing all relevant studies completed to date and  lasting 15 to 20 minutes of a 25 minute visit    Each maintenance medication was reviewed in detail including most importantly the difference between maintenance and prns and under what circumstances the prns are to be triggered using an action plan format that is not reflected in the computer generated alphabetically organized AVS.    Please see instructions for details which were reviewed in writing and the patient given a copy highlighting the part that I personally wrote and discussed at today's ov.

## 2015-03-14 ENCOUNTER — Encounter: Payer: Self-pay | Admitting: Internal Medicine

## 2015-04-05 ENCOUNTER — Ambulatory Visit (INDEPENDENT_AMBULATORY_CARE_PROVIDER_SITE_OTHER): Payer: Medicare Other | Admitting: Internal Medicine

## 2015-04-05 ENCOUNTER — Encounter: Payer: Self-pay | Admitting: Internal Medicine

## 2015-04-05 VITALS — BP 110/78 | HR 102 | Ht 67.0 in | Wt 172.0 lb

## 2015-04-05 DIAGNOSIS — J449 Chronic obstructive pulmonary disease, unspecified: Secondary | ICD-10-CM | POA: Diagnosis not present

## 2015-04-05 DIAGNOSIS — J9611 Chronic respiratory failure with hypoxia: Secondary | ICD-10-CM

## 2015-04-05 MED ORDER — GLYCOPYRROLATE-FORMOTEROL 9-4.8 MCG/ACT IN AERO: 2.0000 | INHALATION_SPRAY | Freq: Two times a day (BID) | RESPIRATORY_TRACT | Status: DC

## 2015-04-05 MED ORDER — PREDNISONE 10 MG PO TABS: ORAL_TABLET | ORAL | Status: DC

## 2015-04-05 MED ORDER — ALBUTEROL SULFATE (2.5 MG/3ML) 0.083% IN NEBU: 2.5000 mg | INHALATION_SOLUTION | Freq: Four times a day (QID) | RESPIRATORY_TRACT | Status: AC | PRN

## 2015-04-05 NOTE — Progress Notes (Signed)
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 Follow-up    Former pt of Dr. Gwenette Greet. Pt states his breathing seems worse since his last visit. He states that he is not using rescue inhaler or neb.     Doe x Mailbox and back sometimes s 02/ quite variable doe/ never at rest or sleeping / walks slow pace = mmrc 2  Never use 02 sitting/   Sleep on 3lpm /walking on 3lpm rec Try dulera 200 Take 2 puffs first thing in am and then another 2 puffs about 12 hours later instead of advair      11/21/2014 f/u ov/Less [facility] re: GOLD III COPD  Chief Complaint  Patient presents with  . Follow-up    Pt states his breathing is about the same.  He states Ruthe Mannan works better than the Advair but had trouble with insurance approving med.    doe continues MMRC =2 even on 02  rec Stop advair and dulera for now  Plan A = Automatic = symbicort 160 Take 2 puffs first thing in am and then another 2 puffs about 12 hours later  Plan B = Backup Only use your albuterol as a rescue medication  Plan C = Crisis Only use your nebulizer albuterol as a backup to plan B (use proair first and if it doesn't work, then use the nebulizer)   04/05/2015  f/u ov/Annais Crafts re: GOLD III copd  Chief Complaint  Patient presents with  . COPD    Breathing is worse since last OV. Reports increased SOB and wheezing. Denies chest tightness and coughing.  not even using neb once a month   No obvious day to day or daytime variabilty or assoc chronic cough or cp or chest tightness, subjective wheeze overt sinus or hb symptoms. No unusual exp hx or h/o childhood pna/ asthma or knowledge of premature birth.  Sleeping  ok without nocturnal  or early am exacerbation  of respiratory  c/o's or need for noct saba. Also denies any obvious fluctuation of symptoms with weather or environmental changes or other aggravating or alleviating factors except as outlined above   Current Medications, Allergies, Complete Past Medical History, Past Surgical History, Family History, and Social History were reviewed in [employer] record.  ROS  The following are not active complaints unless bolded sore throat, dysphagia, dental problems, itching, sneezing,  nasal congestion or excess/ purulent secretions, ear ache,   fever, chills, sweats, unintended wt loss, pleuritic or exertional cp, hemoptysis,  orthopnea pnd or leg swelling, presyncope, palpitations, abdominal pain, anorexia, nausea, vomiting, diarrhea  or change in bowel or urinary habits, change in stools or urine, dysuria,hematuria,  rash, arthralgias, visual complaints, headache, numbness weakness or ataxia or problems with walking or coordination,  change in mood/affect or memory.           Objective:  Physical Exam   amb wm nad on 02    11/21/2014        168 > 01/03/2015    172 > 04/05/2015 172  Wt Readings from Last 3 Encounters:  10/05/14 167 lb 12.8 oz (76.114 kg)  04/06/14 176 lb 3.2 oz (79.924 kg)  10/04/13 175 lb 6.4 oz (79.561 kg)    Vital signs reviewed   HEENT: full dentures/ nl  turbinates, and orophanx. Nl external ear canals without cough reflex   NECK :  without JVD/Nodes/TM/ nl carotid upstrokes bilaterally   LUNGS: no acc muscle use, decreased  bs bilaterally distant wheeze on exp and hyperresonant to percussion   CV:  RRR  no s3 or murmur or increase in P2, no edema   ABD:  soft and nontender with late insp hoover's  in the supine position. No bruits or organomegaly, bowel sounds nl  MS:  warm without deformities, calf tenderness, cyanosis or clubbing  SKIN: warm and dry without lesions    NEURO:  alert, approp,  no deficits   CXR PA and Lateral:   10/05/2014 :     I personally reviewed images and agree with radiology impression as follows:    1. Prior CABG. 2. Basal pleural-parenchymal thickening consistent with scarring. COPD.         Assessment:

## 2015-04-05 NOTE — Patient Instructions (Addendum)
Stop symbicort   Work on inhaler technique:  relax and gently blow all the way out then take a nice smooth deep breath back in, triggering the inhaler at same time you start breathing in.  Hold for up to 5 seconds if you can. Blow out thru nose. Rinse and gargle with water when done     Plan A = Automatic = BEVESPI Take 2 puffs first thing in am and then another 2 puffs about 12 hours later - if not better resume the symbicort   Plan B = Backup Only use your albuterol (proair)as a rescue medication to be used if you can't catch your breath by resting or doing a relaxed purse lip breathing pattern.  - The less you use it, the better it will work when you need it. - Ok to use up to 2 puffs  every 4 hours if you must but call for immediate appointment if use goes up over your usual need - Don't leave home without it !!  (think of it like the spare tire for your car)   Plan C = Crisis Only use your nebulizer albuterol as a backup to plan B (use proair first and if it doesn't work, then use the nebulizer) ok to use up to every 4 hours if you must   Prednisone 10 mg take  4 each am x 2 days,   2 each am x 2 days,  1 each am x 2 days and stop (called in already)   Always use 4lpm with walking  Please schedule a follow up office visit in 6 weeks, call sooner if needed

## 2015-04-06 NOTE — Assessment & Plan Note (Addendum)
PFT's 2008:  FEV1 1.17 (46%), ratio 43, TLC nl, DLCO 55% Spiriva trial:  "made me worse".  Trial of tudorza 09/2011:  Less sleep disturbance, not better doe  - 10/05/2014   try dulera 200 > insurance would not pay, changed back to advair > worse 11/21/2014 - 11/21/2014   try symbicort 160 2bid  - PFT's  01/03/2015  FEV1 0.90 (39 % ) ratio 42  p no % improvement from saba with DLCO  27 % corrects to 46 % for alv volume   - 04/05/2015  extensive coaching HFA effectiveness =    75% > try BEVESPI    I had an extended discussion with the patient reviewing all relevant studies completed to date and  lasting 10 minutes of a 15 minute visit    Each maintenance medication was reviewed in detail including most importantly the difference between maintenance and prns and under what circumstances the prns are to be triggered using an action plan format that is not reflected in the computer generated alphabetically organized AVS.    Please see instructions for details which were reviewed in writing and the patient given a copy highlighting the part that I personally wrote and discussed at today's ov.

## 2015-04-19 ENCOUNTER — Telehealth: Payer: Self-pay | Admitting: Internal Medicine

## 2015-04-19 MED ORDER — PREDNISONE 10 MG PO TABS: ORAL_TABLET | ORAL | Status: DC

## 2015-04-19 NOTE — Telephone Encounter (Signed)
Would like him to first try Prednisone 10 mg take  4 each am x 2 days,   2 each am x 2 days,  1 each am x 2 days and stop and if not happy then change to advair

## 2015-04-19 NOTE — Telephone Encounter (Signed)
Pt is aware of MW's recommendations. Rx will be sent in. Nothing further was needed.

## 2015-04-19 NOTE — Telephone Encounter (Signed)
Spoke with pt. States that when he was here last MW changed his meds. Symbicort was changed to Bevespi. Reports increased DOE and wheezing. Denies chest tightness or coughing. Would like to know if he can go back on Advair.  MW - please advise. Thanks.

## 2015-04-30 ENCOUNTER — Telehealth: Payer: Self-pay | Admitting: Internal Medicine

## 2015-04-30 NOTE — Telephone Encounter (Signed)
Called spoke with pt. He reports he went to pick up RX for bevespi and this is not covered by his insurance.  He was not given alternatives.  Did not see PA in triage. Called [facility] to get this faxed over. Will await PA.

## 2015-05-01 NOTE — Telephone Encounter (Signed)
Called pt   He prefers Bevespi over symbicort  I do not have PA form on this so called [facility] and requested that they fax it again  Will await the fax

## 2015-05-03 NOTE — Telephone Encounter (Signed)
Called spoke with pt. Aware ok to stay on symbicort. He does not need RX sent at this time. Nothing further needed

## 2015-05-03 NOTE — Telephone Encounter (Signed)
Per 04/05/15 OV; Patient Instructions       Stop symbicort  Work on inhaler technique:  relax and gently blow all the way out then take a nice smooth deep breath back in, triggering the inhaler at same time you start breathing in.  Hold for up to 5 seconds if you can. Blow out thru nose. Rinse and gargle with water when done Plan A = Automatic = BEVESPI Take 2 puffs first thing in am and then another 2 puffs about 12 hours later - if not better resume the symbicort  Plan B = Backup Only use your albuterol (proair)as a rescue medication to be used if you can't catch your breath by resting or doing a relaxed purse lip breathing pattern.   - The less you use it, the better it will work when you need it. - Ok to use up to 2 puffs  every 4 hours if you must but call for immediate appointment if use goes up over your usual need - Don't leave home without it !!  (think of it like the spare tire for your car)  Plan C = Crisis Only use your nebulizer albuterol as a backup to plan B (use proair first and if it doesn't work, then use the nebulizer) ok to use up to every 4 hours if you must  Prednisone 10 mg take  4 each am x 2 days,   2 each am x 2 days,  1 each am x 2 days and stop (called in already)  Always use 4lpm with walking Please schedule a follow up office visit in 6 weeks, call sooner if needed   --  Called spoke with pt. He reports with insurance approving bevespi it is still too expensive. He prefers to stay with symbicort. Please advise MW thanks

## 2015-05-03 NOTE — Telephone Encounter (Signed)
PA started on CMM: Key: [id number]  Status: Sent to Plan on February 3rd, 2017  The plan will fax you a determination, typically within 1 to 5 business days.  Drug: Bevespi Aerosphere 9-4.8MCG/ACT aerosol  Form: [employer] Medicare Prior Authorization, Quantity Limits and Formulary Exception Form. This form should be used when a drug-specific form is not available.  Phone: [phone]  Fax: [phone]  Claim Rejection Details  [id number]) - Provide Exception Process Printed NoticePrescribers, contact PA at [phone]  ---  Will forward to [facility] for follow up

## 2015-05-03 NOTE — Telephone Encounter (Signed)
States with insurance helping with medication he still is due to pay $265, he wants to just stay with symbicort and not the other med Phone disconnect before i could get cb number

## 2015-05-03 NOTE — Telephone Encounter (Signed)
Ok with me 

## 2015-05-17 ENCOUNTER — Encounter: Payer: Self-pay | Admitting: Internal Medicine

## 2015-05-17 ENCOUNTER — Ambulatory Visit (INDEPENDENT_AMBULATORY_CARE_PROVIDER_SITE_OTHER)
Admission: RE | Admit: 2015-05-17 | Discharge: 2015-05-17 | Disposition: A | Discharge status: Home / Self Care | Payer: Medicare Other | Source: Ambulatory Visit | Attending: Internal Medicine | Admitting: Internal Medicine

## 2015-05-17 ENCOUNTER — Ambulatory Visit (INDEPENDENT_AMBULATORY_CARE_PROVIDER_SITE_OTHER): Payer: Medicare Other | Admitting: Internal Medicine

## 2015-05-17 VITALS — BP 128/62 | HR 68 | Ht 67.0 in | Wt 173.6 lb

## 2015-05-17 DIAGNOSIS — J9611 Chronic respiratory failure with hypoxia: Secondary | ICD-10-CM | POA: Diagnosis not present

## 2015-05-17 DIAGNOSIS — J449 Chronic obstructive pulmonary disease, unspecified: Secondary | ICD-10-CM

## 2015-05-17 MED ORDER — UMECLIDINIUM BROMIDE 62.5 MCG/INH IN AEPB: 1.0000 | INHALATION_SPRAY | Freq: Every morning | RESPIRATORY_TRACT | Status: DC

## 2015-05-17 NOTE — Assessment & Plan Note (Addendum)
PFT's 2008:  FEV1 1.17 (46%), ratio 43, TLC nl, DLCO 55% Spiriva trial:  "made me worse".  Trial of tudorza 09/2011:  Less sleep disturbance, not better doe  - 10/05/2014   try dulera 200 > insurance would not pay, changed back to advair > worse 11/21/2014 - 11/21/2014   try symbicort 160 2bid  - PFT's  01/03/2015  FEV1 0.90 (39 % ) ratio 42  p no % improvement from saba with DLCO  27 % corrects to 46 % for alv volume  - 04/05/15 trial of bevespi no better > changed back to symbicort 160 2bid   - 05/17/2015  extensive coaching HFA effectiveness =    90%  - 05/17/2015 trial Incruse / GERD rx   DDX of  difficult airways management almost all start with A and  include Adherence, Ace Inhibitors, Acid Reflux, Active Sinus Disease, Alpha 1 Antitripsin deficiency, Anxiety masquerading as Airways dz,  ABPA,  Allergy(esp in young), Aspiration (esp in elderly), Adverse effects of meds,  Active smokers, A bunch of PE's (a small clot burden can't cause this syndrome unless there is already severe underlying pulm or vascular dz with poor reserve) plus two Bs  = Bronchiectasis and Beta blocker use..and one C= CHF  Adherence is always the initial "prime suspect" and is a multilayered concern that requires a "trust but verify" approach in every patient - starting with knowing how to use medications, especially inhalers, correctly, keeping up with refills and understanding the fundamental difference between maintenance and prns vs those medications only taken for a very short course and then stopped and not refilled.  - The proper method of use, as well as anticipated side effects, of a metered-dose inhaler are discussed and demonstrated to the patient. Improved effectiveness after extensive coaching during this visit to a level of approximately 90 % from a baseline of 75 %   ? Acid (or non-acid) GERD > always difficult to exclude as up to 75% of pts in some series report no assoc GI/ Heartburn symptoms> rec max (24h)  acid  suppression and diet restrictions/ reviewed and instructions given in writing.   ? Allergy/asthmatic component > doubt strongly as no better with pred so add LAMA now to LAMA/ICS  I had an extended discussion with the patient reviewing all relevant studies completed to date and  lasting 15 to 20 minutes of a 25 minute visit    Each maintenance medication was reviewed in detail including most importantly the difference between maintenance and prns and under what circumstances the prns are to be triggered using an action plan format that is not reflected in the computer generated alphabetically organized AVS.    Please see instructions for details which were reviewed in writing and the patient given a copy highlighting the part that I personally wrote and discussed at today's ov.

## 2015-05-17 NOTE — Patient Instructions (Addendum)
Try prilosec otc 20mg   Take 30-60 min before first meal of the day and Pepcid ac (famotidine) 20 mg one @  bedtime until cough is completely gone for at least a week without the need for cough suppression    GERD (REFLUX)  is an extremely common cause of respiratory symptoms just like yours , many times with no obvious heartburn at all.    It can be treated with medication, but also with lifestyle changes including elevation of the head of your bed (ideally with 6 inch  bed blocks),  Smoking cessation, avoidance of late meals, excessive alcohol, and avoid fatty foods, chocolate, peppermint, colas, red wine, and acidic juices such as orange juice.  NO MINT OR MENTHOL PRODUCTS SO NO COUGH DROPS  USE SUGARLESS CANDY INSTEAD (Jolley ranchers or Stover's or Life Savers) or even ice chips will also do - the key is to swallow to prevent all throat clearing. NO OIL BASED VITAMINS - use powdered substitutes.    Add Incruse one click each am  - just use samples until you return   Only use your albuterol (proair)as a rescue medication to be used if you can't catch your breath by resting or doing a relaxed purse lip breathing pattern.  - The less you use it, the better it will work when you need it. - Ok to use up to 2 puffs  every 4 hours if you must but call for immediate appointment if use goes up over your usual need - Don't leave home without it !!  (think of it like the spare tire for your car)   Only use you albuterol nebulizer if you try the inhaler first and it doesn't work  02 is 3lpm at rest/ sleeping and 4lpm walking to mailbox and any walk greater than to mailbox is 5lpm   Please schedule a follow up office visit in 2 weeks, sooner if needed to see NP

## 2015-05-17 NOTE — Assessment & Plan Note (Addendum)
10/05/2014   Walked 3lpm x one lap @ 185 stopped due to  sats 86% at nl pace but no sob  11/21/2014  Only walked about 10 ft RA before desat  - 05/17/2015   Walked 4lpm  2 laps @ 185 ft each stopped due to  Sob/ slow pace with sats dipping down but ending at 88%  05/17/2015   rec 3lpm 24/7 except  4lpm with activity and 5lpm with exertion (beyond mb walking )

## 2015-05-17 NOTE — Assessment & Plan Note (Signed)
10/05/2014   Walked 3lpm x one lap @ 185 stopped due to  sats 86% at nl pace but no sob  11/21/2014  Only walked about 10 ft RA before desat     01/03/2015  rec 3lpm 24/7 except  4lpm with exertion

## 2015-05-17 NOTE — Progress Notes (Signed)
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 Follow-up    Former pt of Dr. Gwenette Bell. Pt states his breathing seems worse since his last visit. He states that he is not using rescue inhaler or neb.     [name] and back sometimes s 02/ quite variable Roberto/ never at rest or sleeping / walks slow pace = mmrc 2  Never use 02 sitting/   Sleep on 3lpm /walking on 3lpm rec Try dulera 200 Take 2 puffs first thing in am and then another 2 puffs about 12 hours later instead of advair      11/21/2014 f/u ov/[name] re: GOLD III COPD  Chief Complaint  Patient presents with  . Follow-up    Pt states his breathing is about the same.  He states Ruthe Mannan works better than the Advair but had trouble with insurance approving med.    Roberto continues MMRC =2 even on 02  rec Stop advair and dulera for now  Plan A = Automatic = symbicort 160 Take 2 puffs first thing in am and then another 2 puffs about 12 hours later  Plan B = Backup Only use your albuterol as a rescue medication  Plan C = Crisis Only use your nebulizer albuterol as a backup to plan B (use proair first and if it doesn't work, then use the nebulizer)   04/05/2015  f/u ov/[name] re: GOLD III copd  Chief Complaint  Patient presents with  . COPD    Breathing is worse since last OV. Reports increased SOB and wheezing. Denies chest tightness and coughing.  not even using neb once a month rec Stop symbicort  Plan A = Automatic = BEVESPI Take 2 puffs first thing in am and then another 2 puffs about 12 hours later - if not better resume the symbicort  Plan B = Backup Only use your albuterol (proair)as a rescue  medication  Plan C = Crisis Only use your nebulizer albuterol as a backup to plan B (use proair first and if it doesn't work, then use the nebulizer) ok to use up to every 4 hours if you must  Prednisone 10 mg take  4 each am x 2 days,   2 each am x 2 days,  1 each am x 2 days and stop (called in already)  Always use 4lpm with walking   05/17/2015  f/u ov/[name] re: GOLD III copd/ 02 dep 3lpm at rest/ 4lpm with walking maint rx = symbicort 160 2bid  Chief Complaint  Patient presents with  . Follow-up    Pt c/o increasing SOB, wheeze and occasional cough. Pt denies CP/tightness.    Roberto now room to room even on 4lpm / no better on bevespi even with incruse   No obvious day to day or daytime variabilty or assoc excess/ purulent sputum or mucus plugs  or cp or chest tightness,  overt sinus or hb symptoms. No unusual exp hx or h/o childhood pna/ asthma or knowledge of premature birth.  Sleeping ok without nocturnal  or early am exacerbation  of respiratory  c/o's or need for noct saba. Also denies any obvious fluctuation of symptoms with weather or environmental changes or other aggravating or alleviating factors except as outlined above   Current Medications, Allergies, Complete Past Medical History, Past Surgical History, Family History, and Social History were reviewed in [employer] record.  ROS  The following are not active complaints unless bolded sore throat, dysphagia, dental problems, itching, sneezing,  nasal congestion or excess/ purulent secretions, ear ache,   fever, chills, sweats, unintended wt loss, pleuritic or exertional cp, hemoptysis,  orthopnea pnd or leg swelling, presyncope, palpitations, abdominal pain, anorexia, nausea, vomiting, diarrhea  or change in bowel or urinary habits, change in stools or urine, dysuria,hematuria,  rash, arthralgias, visual complaints, headache, numbness weakness or ataxia or problems with walking or coordination,  change in  mood/affect or memory.           Objective:  Physical Exam   amb wm nad on 02    11/21/2014        168 > 01/03/2015    172 > 04/05/2015 172  > 05/17/2015  174     10/05/14 167 lb 12.8 oz (76.114 kg)  04/06/14 176 lb 3.2 oz (79.924 kg)  10/04/13 175 lb 6.4 oz (79.561 kg)    Vital signs reviewed   HEENT: full dentures/ nl  turbinates, and orophanx. Nl external ear canals without cough reflex   NECK :  without JVD/Nodes/TM/ nl carotid upstrokes bilaterally   LUNGS: no acc muscle use, decreased  bs bilaterally distant wheeze on exp and hyperresonant to percussion   CV:  RRR  no s3 or murmur or increase in P2, no edema   ABD:  Obese mod distended and soft and nontender with late insp hoover's  in the supine position. No bruits or organomegaly, bowel sounds nl  MS:  warm without deformities, calf tenderness, cyanosis or clubbing  SKIN: warm and dry without lesions    NEURO:  alert, approp, no deficits     CXR PA and Lateral:   05/17/2015 :    I personally reviewed images and agree with radiology impression as follows:    No acute cardiopulmonary disease.  Emphysematous disease. Stable postsurgical changes over the left lung.         Assessment:

## 2015-05-18 ENCOUNTER — Encounter: Payer: Self-pay | Admitting: Internal Medicine

## 2015-05-23 ENCOUNTER — Telehealth: Payer: Self-pay | Admitting: Adult Health

## 2015-05-23 NOTE — Telephone Encounter (Signed)
Spoke with the pt  He states unable to take Incruse with Symbicort  The first day was fine, b/c he only had the Incruse in the am  The next day he took Incruse in the am and then had rx for symbicort filled and took pm dose of this  He states right after this he started having nausea and vomiting  He tried meds again the next morning and same result  He tried Symbicort by itself today, and did better but still some nausea  He wants to wait a couple of days to get Inruse out of his system before he takes anything else  Please advise, thanks!  Try prilosec otc 20mg  Take 30-60 min before first meal of the day and Pepcid ac (famotidine) 20 mg one @ bedtime until cough is completely gone for at least a week without the need for cough suppression   GERD (REFLUX) is an extremely common cause of respiratory symptoms just like yours , many times with no obvious heartburn at all.   It can be treated with medication, but also with lifestyle changes including elevation of the head of your bed (ideally with 6 inch bed blocks), Smoking cessation, avoidance of late meals, excessive alcohol, and avoid fatty foods, chocolate, peppermint, colas, red wine, and acidic juices such as orange juice.  NO MINT OR MENTHOL PRODUCTS SO NO COUGH DROPS  USE SUGARLESS CANDY INSTEAD (Jolley ranchers or Stover's or Life Savers) or even ice chips will also do - the key is to swallow to prevent all throat clearing. NO OIL BASED VITAMINS - use powdered substitutes.   Add Incruse one click each am - just use samples until you return   Only use your albuterol (proair)as a rescue medication to be used if you can't catch your breath by resting or doing a relaxed purse lip breathing pattern.  - The less you use it, the better it will work when you need it. - Ok to use up to 2 puffs every 4 hours if you must but call for immediate appointment if use goes up over your usual need - Don't leave home without it !! (think  of it like the spare tire for your car)   Only use you albuterol nebulizer if you try the inhaler first and it doesn't work  02 is 3lpm at rest/ sleeping and 4lpm walking to mailbox and any walk greater than to mailbox is 5lpm   Please schedule a follow up office visit in 2 weeks, sooner if needed to see NP

## 2015-05-23 NOTE — Telephone Encounter (Signed)
Sorry to hear that but nothing else to offer but symbicort until returns at next ov given that he can't tol LAMA, if condition worsens in meantime should go to ER

## 2015-05-24 NOTE — Telephone Encounter (Signed)
Called spoke with pt. He is aware of recs. He needed nothing further needed

## 2015-05-31 ENCOUNTER — Ambulatory Visit: Payer: Medicare Other | Admitting: Adult Health

## 2015-06-10 ENCOUNTER — Encounter: Payer: Self-pay | Admitting: Adult Health

## 2015-06-10 ENCOUNTER — Ambulatory Visit (INDEPENDENT_AMBULATORY_CARE_PROVIDER_SITE_OTHER): Payer: Medicare Other | Admitting: Adult Health

## 2015-06-10 VITALS — BP 110/66 | Temp 98.2°F | Ht 67.0 in | Wt 163.0 lb

## 2015-06-10 DIAGNOSIS — J449 Chronic obstructive pulmonary disease, unspecified: Secondary | ICD-10-CM | POA: Diagnosis not present

## 2015-06-10 DIAGNOSIS — J9611 Chronic respiratory failure with hypoxia: Secondary | ICD-10-CM | POA: Diagnosis not present

## 2015-06-10 DIAGNOSIS — Z23 Encounter for immunization: Secondary | ICD-10-CM

## 2015-06-10 NOTE — Patient Instructions (Addendum)
Continue on Symbicort, rinse after use .  Continue on 02 is 3lpm at rest/ sleeping and 4lpm walking to mailbox and any walk greater than to mailbox is 5lpm  Follow up Dr. Melvyn Novas  In 3 months and As needed   Prevnar Vaccine today .

## 2015-06-10 NOTE — Progress Notes (Signed)
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 Follow-up    Former pt of Dr. Gwenette Greet. Pt states his breathing seems worse since his last visit. He states that he is not using rescue inhaler or neb.     Doe x Mailbox and back sometimes s 02/ quite variable doe/ never at rest or sleeping / walks slow pace = mmrc 2  Never use 02 sitting/   Sleep on 3lpm /walking on 3lpm rec Try dulera 200 Take 2 puffs first thing in am and then another 2 puffs about 12 hours later instead of advair      11/21/2014 f/u ov/Wert re: GOLD III COPD  Chief Complaint  Patient presents with  . Follow-up    Pt states his breathing is about the same.  He states Ruthe Mannan works better than the Advair but had trouble with insurance approving med.    doe continues MMRC =2 even on 02  rec Stop advair and dulera for now  Plan A = Automatic = symbicort 160 Take 2 puffs first thing in am and then another 2 puffs about 12 hours later  Plan B = Backup Only use your albuterol as a rescue medication  Plan C = Crisis Only use your nebulizer albuterol as a backup to plan B (use proair first and if it doesn't work, then use the nebulizer)   04/05/2015  f/u ov/Wert re: GOLD III copd  Chief Complaint  Patient presents with  . COPD    Breathing is worse since last OV. Reports increased SOB and wheezing. Denies chest tightness and coughing.  not even using neb once a month rec Stop symbicort  Plan A = Automatic = BEVESPI Take 2 puffs first thing in am and then another 2 puffs about 12 hours later - if not better resume the symbicort  Plan B = Backup Only use your albuterol (proair)as a rescue  medication  Plan C = Crisis Only use your nebulizer albuterol as a backup to plan B (use proair first and if it doesn't work, then use the nebulizer) ok to use up to every 4 hours if you must  Prednisone 10 mg take  4 each am x 2 days,   2 each am x 2 days,  1 each am x 2 days and stop (called in already)  Always use 4lpm with walking   05/17/2015  f/u ov/Wert re: GOLD III copd/ 02 dep 3lpm at rest/ 4lpm with walking maint rx = symbicort 160 2bid  Chief Complaint  Patient presents with  . Follow-up    Pt c/o increasing SOB, wheeze and occasional cough. Pt denies CP/tightness.    doe now room to room even on 4lpm / no better on bevespi even with incruse   >incruse ./cXR chronic changes  06/10/2015 Follow up : COPD GOLD III , O2 dependent  Pt returns for 1 month follow up .  Seen last ov with more dyspnea. Given trial of incruse but  Did not lke incruse.  Taking Symbicort Twice daily   Uses oxygen 3l/m rest and 5 l  with prolonged walking /distance.  Denies chest pain, orthopnea, edema or fever Discussed prevnar vaccine , would like to get to today.     Current Medications, Allergies, Complete Past Medical History, Past Surgical History, Family History, and Social History were reviewed in [employer] record.  ROS  The following are not active complaints unless bolded sore throat, dysphagia, dental problems, itching, sneezing,  nasal congestion or excess/ purulent secretions, ear ache,   fever, chills, sweats, unintended wt loss, pleuritic or exertional cp, hemoptysis,  orthopnea pnd or leg swelling, presyncope, palpitations, abdominal pain, anorexia, nausea, vomiting, diarrhea  or change in bowel or urinary habits, change in stools or urine, dysuria,hematuria,  rash, arthralgias, visual complaints, headache, numbness weakness or ataxia or problems with walking or coordination,  change in mood/affect or memory.           Objective:  Physical Exam   amb wm nad on  02    11/21/2014        168 > 01/03/2015    172 > 04/05/2015 172  > 05/17/2015  174  Filed Vitals:   06/10/15 1526  BP: 110/66  Temp: 98.2 F (36.8 C)  TempSrc: Oral  Height: 5\' 7"  (1.702 m)  Weight: 163 lb (73.936 kg)  SpO2: 90%      Vital signs reviewed   HEENT: full dentures/ nl  turbinates, and orophanx. Nl external ear canals without cough reflex   NECK :  without JVD/Nodes/TM/ nl carotid upstrokes bilaterally   LUNGS: no acc muscle use, decreased  bs bilaterally  CV:  RRR  no s3 or murmur or increase in P2, no edema   ABD:  Obese mod distended and soft and nontender with late insp hoover's  in the supine position. No bruits or organomegaly, bowel sounds nl  MS:  warm without deformities, calf tenderness, cyanosis or clubbing  SKIN: warm and dry without lesions    NEURO:  alert, approp, no deficits     CXR PA and Lateral:   05/17/2015 :      No acute cardiopulmonary disease.  Emphysematous disease. Stable postsurgical changes over the left lung.   Analeise Mccleery NP-C  [facility]  06/10/15       Assessment:

## 2015-06-14 NOTE — Assessment & Plan Note (Signed)
Continue on Symbicort, rinse after use .  Continue on 02 is 3lpm at rest/ sleeping and 4lpm walking to mailbox and any walk greater than to mailbox is 5lpm  Follow up Dr. Melvyn Novas  In 3 months and As needed   Prevnar Vaccine today .

## 2015-07-23 ENCOUNTER — Telehealth: Payer: Self-pay | Admitting: Internal Medicine

## 2015-07-23 MED ORDER — SYMBICORT 160-4.5 MCG/ACT IN AERO: 2.0000 | INHALATION_SPRAY | Freq: Two times a day (BID) | RESPIRATORY_TRACT | Status: DC

## 2015-07-23 NOTE — Telephone Encounter (Signed)
Called spoke with pt. He states that he needs a refill on his symbicort. I verified pharmacy as [facility] in [facility]. He voiced understanding and had no further questions. Rx sent. Nothing further needed.

## 2015-09-09 ENCOUNTER — Other Ambulatory Visit: Payer: Self-pay | Admitting: Physician Assistant

## 2015-09-09 DIAGNOSIS — R634 Abnormal weight loss: Secondary | ICD-10-CM

## 2015-09-09 DIAGNOSIS — R1310 Dysphagia, unspecified: Secondary | ICD-10-CM

## 2015-09-10 ENCOUNTER — Ambulatory Visit: Payer: Medicare Other | Admitting: Internal Medicine

## 2015-09-12 ENCOUNTER — Ambulatory Visit
Admission: RE | Admit: 2015-09-12 | Discharge: 2015-09-12 | Disposition: A | Discharge status: Home / Self Care | Payer: Medicare Other | Source: Ambulatory Visit | Attending: Physician Assistant | Admitting: Physician Assistant

## 2015-09-12 DIAGNOSIS — R1310 Dysphagia, unspecified: Secondary | ICD-10-CM

## 2015-09-12 DIAGNOSIS — R634 Abnormal weight loss: Secondary | ICD-10-CM

## 2015-09-16 ENCOUNTER — Other Ambulatory Visit: Payer: Self-pay | Admitting: Gastroenterology

## 2015-09-16 ENCOUNTER — Encounter (HOSPITAL_COMMUNITY): Payer: Self-pay | Admitting: *Deleted

## 2015-09-19 ENCOUNTER — Ambulatory Visit (HOSPITAL_COMMUNITY): Payer: Medicare Other | Admitting: Anesthesiology

## 2015-09-19 ENCOUNTER — Ambulatory Visit (HOSPITAL_COMMUNITY)
Admission: RE | Admit: 2015-09-19 | Discharge: 2015-09-19 | Disposition: A | Discharge status: Home / Self Care | Payer: Medicare Other | Source: Ambulatory Visit | Attending: Gastroenterology | Admitting: Gastroenterology

## 2015-09-19 ENCOUNTER — Encounter (HOSPITAL_COMMUNITY)
Admission: RE | Disposition: A | Discharge status: Home / Self Care | Payer: Self-pay | Source: Ambulatory Visit | Attending: Gastroenterology

## 2015-09-19 ENCOUNTER — Encounter (HOSPITAL_COMMUNITY): Payer: Self-pay | Admitting: *Deleted

## 2015-09-19 DIAGNOSIS — I2510 Atherosclerotic heart disease of native coronary artery without angina pectoris: Secondary | ICD-10-CM | POA: Insufficient documentation

## 2015-09-19 DIAGNOSIS — J449 Chronic obstructive pulmonary disease, unspecified: Secondary | ICD-10-CM | POA: Diagnosis not present

## 2015-09-19 DIAGNOSIS — I252 Old myocardial infarction: Secondary | ICD-10-CM | POA: Diagnosis not present

## 2015-09-19 DIAGNOSIS — Z9981 Dependence on supplemental oxygen: Secondary | ICD-10-CM | POA: Diagnosis not present

## 2015-09-19 DIAGNOSIS — Z951 Presence of aortocoronary bypass graft: Secondary | ICD-10-CM | POA: Diagnosis not present

## 2015-09-19 DIAGNOSIS — C155 Malignant neoplasm of lower third of esophagus: Secondary | ICD-10-CM | POA: Diagnosis not present

## 2015-09-19 DIAGNOSIS — Z79899 Other long term (current) drug therapy: Secondary | ICD-10-CM | POA: Insufficient documentation

## 2015-09-19 DIAGNOSIS — Z7951 Long term (current) use of inhaled steroids: Secondary | ICD-10-CM | POA: Diagnosis not present

## 2015-09-19 DIAGNOSIS — R4702 Dysphasia: Secondary | ICD-10-CM | POA: Diagnosis present

## 2015-09-19 DIAGNOSIS — R933 Abnormal findings on diagnostic imaging of other parts of digestive tract: Secondary | ICD-10-CM | POA: Diagnosis not present

## 2015-09-19 DIAGNOSIS — I739 Peripheral vascular disease, unspecified: Secondary | ICD-10-CM | POA: Diagnosis not present

## 2015-09-19 DIAGNOSIS — I10 Essential (primary) hypertension: Secondary | ICD-10-CM | POA: Insufficient documentation

## 2015-09-19 DIAGNOSIS — Z7982 Long term (current) use of aspirin: Secondary | ICD-10-CM | POA: Insufficient documentation

## 2015-09-19 DIAGNOSIS — Z87891 Personal history of nicotine dependence: Secondary | ICD-10-CM | POA: Diagnosis not present

## 2015-09-19 HISTORY — PX: ESOPHAGOGASTRODUODENOSCOPY (EGD) WITH PROPOFOL: SHX5813

## 2015-09-19 HISTORY — DX: Dependence on supplemental oxygen: Z99.81

## 2015-09-19 HISTORY — PX: SAVORY DILATION: SHX5439

## 2015-09-19 SURGERY — ESOPHAGOGASTRODUODENOSCOPY (EGD) WITH PROPOFOL
Anesthesia: Monitor Anesthesia Care

## 2015-09-19 MED ORDER — PROPOFOL 10 MG/ML IV BOLUS
INTRAVENOUS | Status: AC
Start: 2015-09-19 — End: 2015-09-19
  Filled 2015-09-19: qty 40

## 2015-09-19 MED ORDER — PROPOFOL 500 MG/50ML IV EMUL
INTRAVENOUS | Status: DC | PRN
  Administered 2015-09-19: 200 ug/kg/min via INTRAVENOUS

## 2015-09-19 MED ORDER — ONDANSETRON HCL 4 MG/2ML IJ SOLN
INTRAMUSCULAR | Status: DC | PRN
  Administered 2015-09-19: 4 mg via INTRAVENOUS

## 2015-09-19 MED ORDER — PROPOFOL 10 MG/ML IV BOLUS
INTRAVENOUS | Status: DC | PRN
  Administered 2015-09-19: 20 mg via INTRAVENOUS

## 2015-09-19 MED ORDER — LACTATED RINGERS IV SOLN
INTRAVENOUS | Status: DC
  Administered 2015-09-19: 10:00:00 via INTRAVENOUS

## 2015-09-19 MED ORDER — SODIUM CHLORIDE 0.9 % IV SOLN: INTRAVENOUS | Status: DC

## 2015-09-19 MED ORDER — ONDANSETRON HCL 4 MG/2ML IJ SOLN
INTRAMUSCULAR | Status: AC
  Filled 2015-09-19: qty 2

## 2015-09-19 SURGICAL SUPPLY — 14 items

## 2015-09-19 NOTE — Progress Notes (Signed)
[name] 11:10 AM  Subjective: Patient without any new complaints since I saw him last week in the office and his procedure was discussed with his son as well and we again discussed the possible diagnosis  Objective: Vital signs stable afebrile exam please see preassessment evaluation  Assessment: Dysphasia abnormal barium swallow  Plan: Okay to proceed with EGD and biopsy and possible dilation today with anesthesia assistance with further workup and plans pending those findings  [facility]  Pager [phone] After 5PM or if no answer call [phone]

## 2015-09-19 NOTE — Transfer of Care (Signed)
Immediate Anesthesia Transfer of Care Note  Patient: [name]  Procedure(s) Performed: Procedure(s) with comments: ESOPHAGOGASTRODUODENOSCOPY (EGD) WITH PROPOFOL (N/A) SAVORY DILATION (N/A) - needs carm  Patient Location: PACU  Anesthesia Type:MAC  Level of Consciousness:  sedated, patient cooperative and responds to stimulation  Airway & Oxygen Therapy:Patient Spontanous Breathing and Patient connected to face mask oxgen  Post-op Assessment:  Report given to PACU RN and Post -op Vital signs reviewed and stable  Post vital signs:  Reviewed and stable  Last Vitals:  Filed Vitals:   09/19/15 0949  BP: 125/69  Pulse: 95  Temp: 36.7 C  Resp: 20    Complications: No apparent anesthesia complications

## 2015-09-19 NOTE — Discharge Instructions (Addendum)
Gastrointestinal Endoscopy, Care After Refer to this sheet in the next few weeks. These instructions provide you with information on caring for yourself after your procedure. Your caregiver may also give you more specific instructions. Your treatment has been planned according to current medical practices, but problems sometimes occur. Call your caregiver if you have any problems or questions after your procedure. HOME CARE INSTRUCTIONS  If you were given medicine to help you relax (sedative), do not drive, operate machinery, or sign important documents for 24 hours.  Avoid alcohol and hot or warm beverages for the first 24 hours after the procedure.  Only take over-the-counter or prescription medicines for pain, discomfort, or fever as directed by your caregiver. You may resume taking your normal medicines unless your caregiver tells you otherwise. Ask your caregiver when you may resume taking medicines that may cause bleeding, such as aspirin, clopidogrel, or warfarin.  You may return to your normal diet and activities on the day after your procedure, or as directed by your caregiver. Walking may help to reduce any bloated feeling in your abdomen.  Drink enough fluids to keep your urine clear or pale yellow.  You may gargle with salt water if you have a sore throat. SEEK IMMEDIATE MEDICAL CARE IF:  You have severe nausea or vomiting.  You have severe abdominal pain, abdominal cramps that last longer than 6 hours, or abdominal swelling (distention).  You have severe shoulder or back pain.  You have trouble swallowing.  You have shortness of breath, your breathing is shallow, or you are breathing faster than normal.  You have a fever or a rapid heartbeat.  You vomit blood or material that looks like coffee grounds.  You have bloody, black, or tarry stools. MAKE SURE YOU:  Understand these instructions.  Will watch your condition.  Will get help right away if you are not doing  well or get worse.   This information is not intended to replace advice given to you by your health care provider. Make sure you discuss any questions you have with your health care provider.   Document Released: 10/29/2003 Document Revised: 04/06/2014 Document Reviewed: 06/16/2011 Elsevier Interactive Patient Education [employer]. Call if question or problem otherwise call for biopsy report on Tuesday if you do not hear from me and will set up oncology and radiation oncology consults ASAP and continue liquid diet and pured foods and okay to repeat resume aspirin and Plavix and consider stent placement as per our discussion

## 2015-09-19 NOTE — Anesthesia Preprocedure Evaluation (Signed)
Anesthesia Evaluation  Patient identified by MRN, date of birth, ID band Patient awake    Reviewed: Allergy & Precautions, NPO status , Patient's Chart, lab work & pertinent test results  Airway Mallampati: II  TM Distance: >3 FB Neck ROM: Full    Dental no notable dental hx.    Pulmonary shortness of breath, COPD,  oxygen dependent, former smoker,    Pulmonary exam normal breath sounds clear to auscultation       Cardiovascular hypertension, + CAD, + Past MI, + CABG and + Peripheral Vascular Disease  Normal cardiovascular exam Rhythm:Regular Rate:Normal     Neuro/Psych negative neurological ROS  negative psych ROS   GI/Hepatic negative GI ROS, Neg liver ROS,   Endo/Other  negative endocrine ROS  Renal/GU negative Renal ROS  negative genitourinary   Musculoskeletal negative musculoskeletal ROS (+)   Abdominal   Peds negative pediatric ROS (+)  Hematology  (+) anemia ,   Anesthesia Other Findings   Reproductive/Obstetrics negative OB ROS                             Anesthesia Physical Anesthesia Plan  ASA: IV  Anesthesia Plan: MAC   Post-op Pain Management:    Induction: Intravenous  Airway Management Planned: Natural Airway  Additional Equipment:   Intra-op Plan:   Post-operative Plan:   Informed Consent: I have reviewed the patients History and Physical, chart, labs and discussed the procedure including the risks, benefits and alternatives for the proposed anesthesia with the patient or authorized representative who has indicated his/her understanding and acceptance.   Dental advisory given  Plan Discussed with: CRNA  Anesthesia Plan Comments:         Anesthesia Quick Evaluation

## 2015-09-19 NOTE — Anesthesia Postprocedure Evaluation (Signed)
Anesthesia Post Note  Patient: [name]  Procedure(s) Performed: Procedure(s) (LRB): ESOPHAGOGASTRODUODENOSCOPY (EGD) WITH PROPOFOL (N/A) SAVORY DILATION (N/A)  Patient location during evaluation: PACU Anesthesia Type: MAC Level of consciousness: awake and alert Pain management: pain level controlled Vital Signs Assessment: post-procedure vital signs reviewed and stable Respiratory status: spontaneous breathing, nonlabored ventilation, respiratory function stable and patient connected to nasal cannula oxygen Cardiovascular status: stable and blood pressure returned to baseline Anesthetic complications: no    Last Vitals:  Filed Vitals:   09/19/15 1200 09/19/15 1210  BP: 149/74 147/79  Pulse: 82 75  Temp:    Resp: 23 26    Last Pain: There were no vitals filed for this visit.               Ludger Bones J

## 2015-09-19 NOTE — Op Note (Addendum)
[facility] Patient Name: [name] Procedure Date: 09/19/2015 MRN: [id number] Attending MD: Clarene Essex , MD Date of Birth: 08-02-1931 CSN: [id number] Age: 82 Admit Type: Outpatient Procedure:                Upper GI endoscopy Indications:              Dysphagia, Abnormal UGI series Barium swallow Providers:                Clarene Essex, MD, Laverta Baltimore, RN, [facility]                            [place], Merchant [employer] officer, [place] Zenia Resides CRNA, CRNA Referring MD:              Medicines:                Propofol total dose [id number] mg IV Complications:            No immediate complications. Estimated Blood Loss:     Estimated blood loss: none. Procedure:                Pre-Anesthesia Assessment:                           - Prior to the procedure, a History and Physical                            was performed, and patient medications and                            allergies were reviewed. The patient's tolerance of                            previous anesthesia was also reviewed. The risks                            and benefits of the procedure and the sedation                            options and risks were discussed with the patient.                            All questions were answered, and informed consent                            was obtained. Prior Anticoagulants: The patient has                            taken Plavix (clopidogrel), last dose was 4 days                            prior to procedure. ASA Grade Assessment: III - A                            patient with severe systemic disease. After  reviewing the risks and benefits, the patient was                            deemed in satisfactory condition to undergo the                            procedure.                           After obtaining informed consent, the endoscope was                            passed under direct vision. Throughout the   procedure, the patient's blood pressure, pulse, and                            oxygen saturations were monitored continuously. The                            [id number]) scope was introduced through the                            mouth, and advanced to the lower third of                            esophagus. The upper GI endoscopy was accomplished                            without difficulty. The patient tolerated the                            procedure well. Scope In: Scope Out: Findings:      The larynx was normal.      A large, ulcerating mass with bleeding was found in the lower third of       the esophagus, 32 cm from the incisors. The mass was partially       obstructing and circumferential. we could not pass the endoscope past       the mass. Biopsies were taken with a cold forceps for histology.      The upper third of the esophagus and middle third of the esophagus were       normal.      The exam was otherwise without abnormality. Impression:               - Normal larynx.                           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                            Biopsied.                           -  Normal upper third of esophagus and middle third                            of esophagus.                           - The examination was otherwise normal. but unable                            to pass scope past the mass Moderate Sedation:      N/A- Per Anesthesia Care Recommendation:           - Patient has a contact number available for                            emergencies. The signs and symptoms of potential                            delayed complications were discussed with the                            patient. Return to normal activities tomorrow.                            Written discharge instructions were provided to the                            patient.                            - Pureed diet today.                           - Continue present medications.                           - Await pathology results. probably will need                            oncology and radiation oncology consults and doubt                            patient would be surgical candidate and probably                            will be best served with just a stent and happy to                            proceed when okayed by consultants                           - Return to GI clinic PRN.                           - Telephone GI clinic for pathology results in 4  days.                           - Telephone GI clinic if symptomatic PRN. Procedure Code(s):        --- Professional ---                           [phone], Esophagoscopy, flexible, transoral; with                            biopsy, single or multiple Diagnosis Code(s):        --- Professional ---                           C15.5, Malignant neoplasm of lower third of                            esophagus                           R13.10, Dysphagia, unspecified                           R93.3, Abnormal findings on diagnostic imaging of                            other parts of digestive tract CPT copyright 2016 American Medical Association. All rights reserved. The codes documented in this report are preliminary and upon coder review may  be revised to meet current compliance requirements. Clarene Essex, MD 09/19/2015 11:45:54 AM This report has been signed electronically. Number of Addenda: 0

## 2015-09-20 ENCOUNTER — Telehealth: Payer: Self-pay | Admitting: Hematology

## 2015-09-20 NOTE — Telephone Encounter (Signed)
Confirmed appt date/time for rad and med onc., complete intake

## 2015-09-20 NOTE — Progress Notes (Signed)
Radiation Oncology         [phone] ________________________________  Name: [name] MRN: [id number]  Date: 09/23/2015  DOB: Nov 08, 1931  [id number], MD  [name], MD     REFERRING PHYSICIAN: [name], MD   DIAGNOSIS: The encounter diagnosis was Primary esophageal malignancy ([place]).   HISTORY OF PRESENT ILLNESS: [name] is a 82 y.o. male seen at the request of Dr. Watt Climes for a new diagnosis of esophageal cancer. The patient had noticed 2 months of dysphagia and 15 pounds weight loss since April 2017. The patient underwent esophogram/ barium swallow for further workup on 09/12/15. This identified a constricting lesion of the mid distal esophagus with mucosal irregularity most consistent with esophageal carcinoma over several centimeter lengths. This lesion does not cause obstruction to the passage of barium but in light of these findings a barium pill was not given. Evidence of a paraesophageal hernia was also present.    Endoscopy on 09/19/15 with Dr. Watt Climes revealed a large,ulcerating mass with bleeding in the lower third of the esophagus, 32 cm from the incisors. The mass was partially obstructing and circumferential. Upper third and middle third of the esophagus appeared normal. 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 The patient will be seen by Dr. Irene Limbo on Thursday of this week, and presented today to discuss the role of radiotherapy with Dr. Lisbeth Renshaw.   PREVIOUS RADIATION THERAPY: No   PAST MEDICAL HISTORY:  Past Medical History  Diagnosis Date  . Pulmonary nodule     s/p wedge resction, inflammatory, +MAC  . Emphysema   . Cardiomyopathy     EF 25% by echo 2009  . DVT (deep venous thrombosis) ([place])   . CAD (coronary artery disease)     Dr Einar Gip every 6 months  . Hypertension   . Peripheral vascular disease ([place])   . Myocardial infarction ([place]) 1986  . Shortness of breath   . COPD  (chronic obstructive pulmonary disease) ([place])     on O2 constantly  . Keeps losing balance   . Anemia   . Vitamin B 12 deficiency   . Carotid artery occlusion   . History of oxygen administration     Oxygen 3 l/m at rest / 4 l/m when active. 24/7  . Esophageal cancer ([place])     'causes chest discomfort, liquids and solids"       PAST SURGICAL HISTORY: Past Surgical History  Procedure Laterality Date  . Carotid endarterectomy  2009    triple bypass  . Lung surgery  2008    tumor"infection"  . Coronary artery bypass graft  2009  . Vascular surgery    . Carotid-vertebral bypass graft  08/20/2011    Procedure: BYPASS GRAFT CAROTID-VERTEBRAL;  Surgeon: Serafina Mitchell, MD;  Location: [facility] OR;  Service: Vascular;  Laterality: Right;  Right CAROTID-VERTEBRAL TRANSPOSITION  . Pr vein bypass graft,aorto-fem-pop    . Arch aortogram N/A 07/21/2011    Procedure: ARCH AORTOGRAM;  Surgeon: Laverda Page, MD;  Location: [facility] CATH LAB;  Service: Cardiovascular;  Laterality: N/A;  . Esophagogastroduodenoscopy (egd) with propofol N/A 09/19/2015    Procedure: ESOPHAGOGASTRODUODENOSCOPY (EGD) WITH PROPOFOL;  Surgeon: Clarene Essex, MD;  Location: [facility];  Service: Endoscopy;  Laterality: N/A;  . Savory dilation N/A 09/19/2015    Procedure: SAVORY DILATION;  Surgeon: Clarene Essex, MD;  Location: [facility];  Service: Endoscopy;  Laterality: N/A;  needs carm     FAMILY  HISTORY:  Family History  Problem Relation Age of Onset  . Heart disease Father   . Diabetes Sister   . Anesthesia problems Neg Hx      SOCIAL HISTORY:  reports that he quit smoking about 9 years ago. His smoking use included Cigarettes. He has a 112 pack-year smoking history. He has never used smokeless tobacco. He reports that he drinks about 2.4 oz of alcohol per week. He reports that he does not use illicit drugs. He is originally from [place] and has lived here in the last for about 50 years. He lives in [place]  with his adult son, and is divorced.    ALLERGIES: Symbicort and Tetanus toxoids  - Symbicort caused dizziness, weakness and blurry vision  MEDICATIONS:  Current Outpatient Prescriptions  Medication Sig Dispense Refill  . albuterol (PROAIR HFA) 108 (90 BASE) MCG/ACT inhaler Inhale 2 puffs into the lungs every 6 (six) hours as needed. For shortness of breath 1 Inhaler 2  . albuterol (PROVENTIL) (2.5 MG/3ML) 0.083% nebulizer solution Take 3 mLs (2.5 mg total) by nebulization 4 (four) times daily as needed. For shortness of breath 75 mL 11  . aspirin 81 MG chewable tablet Chew 81 mg by mouth daily.    [name] atorvastatin (LIPITOR) 40 MG tablet Take 40 mg by mouth daily.    [name] CARAFATE 1 GM/10ML suspension Take 10 mLs by mouth 3 (three) times daily.    . cetirizine (ZYRTEC) 10 MG tablet Take 10 mg by mouth daily as needed. For runny nose    . cilostazol (PLETAL) 100 MG tablet Take 50 mg by mouth 2 (two) times daily.     . clopidogrel (PLAVIX) 75 MG tablet Take 75 mg by mouth daily.     . folic acid (FOLVITE) 1 MG tablet Take 1 mg by mouth daily.     [name] losartan (COZAAR) 25 MG tablet Take 25 mg by mouth daily.    . vitamin B-12 (CYANOCOBALAMIN) 1000 MCG tablet Take 1,000 mcg by mouth daily.    . metoprolol tartrate (LOPRESSOR) 25 MG tablet Take 25 mg by mouth daily.    . SYMBICORT 160-4.5 MCG/ACT inhaler Inhale 2 puffs into the lungs 2 (two) times daily. (Patient not taking: Reported on 09/23/2015) 1 Inhaler 5  . umeclidinium bromide (INCRUSE ELLIPTA) 62.5 MCG/INH AEPB Inhale 1 puff into the lungs every morning. (Patient not taking: Reported on 09/23/2015)     No current facility-administered medications for this encounter.     REVIEW OF SYSTEMS: On review of systems, The patient reports that he has lost about 15 pounds in April. He states that he has had dysphagia with solid foods and odynophagia at times including liquids. He describes a burning pain in chest and epigastrium was eating,. He denies  any chest pain, shortness of breath, cough, fevers, chills, night sweats. He denies any bowel or bladder disturbances, and denies abdominal pain, nausea or vomiting. He denies any new musculoskeletal or joint aches or pains. A complete review of systems is obtained and is otherwise negative.    PHYSICAL EXAM:  height is 5\' 7"  (1.702 m) and weight is 159 lb 14.4 oz (72.53 kg). His oral temperature is 98.5 F (36.9 C). His blood pressure is 122/64 and his pulse is 85. His respiration is 24 and oxygen saturation is 91%.    Pain scale 0/10  In general this is a well appearing caucasian male in no acute distress. He presents in a wheelchair and on 3L  of supplemental oxygen. He is alert and oriented x4 and appropriate throughout the examination. HEENT reveals that the patient is normocephalic, atraumatic. EOMs are intact. PERRLA. Skin is intact without any evidence of gross lesions. Cardiovascular exam reveals a regular rate and rhythm, no clicks rubs or murmurs are auscultated. Chest is clear to auscultation bilaterally. Lymphatic assessment is performed and does not reveal any adenopathy in the cervical, supraclavicular, axillary, or inguinal chains. Abdomen has active bowel sounds in all quadrants and is intact. The abdomen is soft, non tender, non distended. Lower extremities are negative for pretibial pitting edema, deep calf tenderness, cyanosis or clubbing.    ECOG = 1  0 - Asymptomatic (Fully active, able to carry on all predisease activities without restriction)  1 - Symptomatic but completely ambulatory (Restricted in physically strenuous activity but ambulatory and able to carry out work of a light or sedentary nature. For example, light housework, office work)  2 - Symptomatic, <50% in bed during the day (Ambulatory and capable of all self care but unable to carry out any work activities. Up and about more than 50% of waking hours)  3 - Symptomatic, >50% in bed, but not bedbound (Capable of  only limited self-care, confined to bed or chair 50% or more of waking hours)  4 - Bedbound (Completely disabled. Cannot carry on any self-care. Totally confined to bed or chair)  5 - Death   Eustace Pen MM, Creech RH, Tormey DC, et al. [id number]). "Toxicity and response criteria of the [facility]". [place] Oncol. 5 (6): 649-55    LABORATORY DATA:  Lab Results  Component Value Date   WBC 7.4 08/21/2011   HGB 11.4* 08/21/2011   HCT 33.5* 08/21/2011   MCV 87.7 08/21/2011   PLT 161 08/21/2011   Lab Results  Component Value Date   NA 139 08/21/2011   K 4.0 08/21/2011   CL 107 08/21/2011   CO2 24 08/21/2011   Lab Results  Component Value Date   ALT 18 08/18/2011   AST 20 08/18/2011   ALKPHOS 52 08/18/2011   BILITOT 0.6 08/18/2011      RADIOGRAPHY: Dg Esophagus  09/12/2015  CLINICAL DATA:  Dysphagia, loss of 4 8 EXAM: ESOPHOGRAM/BARIUM SWALLOW TECHNIQUE: Single contrast examination was performed using  thin barium. FLUOROSCOPY TIME:  Radiation Exposure Index (as provided by the fluoroscopic device): 47 deciGy per square cm If the device does not provide the exposure index: Fluoroscopy Time:  1 minutes 12 seconds Number of Acquired Images: COMPARISON:  None. FINDINGS: The study was begun in the erect position with AP and lateral views of the cervical esophagus obtained. The swallowing mechanism is unremarkable. The does appear to be a small right lateral lower ring conceal present. No aspiration or penetration is seen. There are mild tertiary contractions in the mid and distal esophagus. However, a few cm below the level of the carina there is a constricting lesion of the mid distal esophagus with mucosal irregularity consistent with esophageal carcinoma over several cm length. No obstruction to the passage of barium is seen. However in view of this finding a barium pill was not given. On delayed images there does appear to be a paraesophageal hernia present. No  gastroesophageal reflux could be elicited. IMPRESSION: 1. Constricting lesion of the mid distal esophagus with mucosal irregularity most consistent with esophageal carcinoma over several cm length. This lesion does not cause obstruction to the passage of barium. A barium pill however was not given. 2.  Paraesophageal hernia. 3. Mild tertiary contractions. Electronically Signed   By: Ivar Drape M.D.   On: 09/12/2015 09:17       IMPRESSION:  82 year old male diagnosed with esophageal cancer, squamous cell type, currently in the process of continued workup.   PLAN: Dr. Lisbeth Renshaw discusses pathology and the need for continued workup of this patient's cancer; he recommends staging with PET scan and referral to Dr.Outlaw for endoscopic ultrasound to evaluate tumor size and nodal involvement. The patient will meet with Medical Oncology to discuss concurrent chemoradiation and though his medical comorbidities may limit his ability to have surgical resection but we would like this formally determined by Dr.Gerhardt. We will make this referral accordingly and move forward with these scans. We will set him up for simulation provided that he does not have gross evidence of metastatic disease. Dr.Moody discussed if radiotherapy is considered, he would recommend 5.5-6 weeks of treatment. He discuss the risks, benefits, short and long-term effects of radiotherapy. We discussed the possibility of needing a feeding tube if his swallowing worsens. We will be in touch with the patient once his ultrasound and PET have been completed, to discuss details on moving forward, and he is scheduled for simulation on Friday June 30th at 2 pm. Burnis Medin also make a referral to nutrition and have discussed the possible need for NJ tube if the patient is unable to maintain his nutrition adequately during treatment.  The above documentation reflects my direct findings during this shared patient visit. Please see the separate note by Dr. Lisbeth Renshaw on  this date for the remainder of the patient's plan of care.    Carola Rhine, PAC  This document serves as a record of services personally performed by Kyung Rudd, MD. It was created on his behalf by Derek Mound, a trained medical scribe. The creation of this record is based on the scribe's personal observations and the provider's statements to them. This document has been checked and approved by the attending provider.

## 2015-09-23 ENCOUNTER — Encounter: Payer: Self-pay | Admitting: Radiation Oncology

## 2015-09-23 ENCOUNTER — Ambulatory Visit
Admission: RE | Admit: 2015-09-23 | Discharge: 2015-09-23 | Disposition: A | Discharge status: Home / Self Care | Payer: Medicare Other | Source: Ambulatory Visit | Attending: Radiation Oncology | Admitting: Radiation Oncology

## 2015-09-23 ENCOUNTER — Encounter: Payer: Self-pay | Admitting: Internal Medicine

## 2015-09-23 ENCOUNTER — Ambulatory Visit (INDEPENDENT_AMBULATORY_CARE_PROVIDER_SITE_OTHER): Payer: Medicare Other | Admitting: Internal Medicine

## 2015-09-23 VITALS — BP 122/64 | HR 85 | Temp 98.5°F | Resp 24 | Ht 67.0 in | Wt 159.9 lb

## 2015-09-23 VITALS — BP 116/60 | HR 99 | Ht 65.5 in | Wt 160.0 lb

## 2015-09-23 DIAGNOSIS — J449 Chronic obstructive pulmonary disease, unspecified: Secondary | ICD-10-CM | POA: Diagnosis not present

## 2015-09-23 DIAGNOSIS — Z510 Encounter for antineoplastic radiation therapy: Secondary | ICD-10-CM | POA: Diagnosis not present

## 2015-09-23 DIAGNOSIS — C159 Malignant neoplasm of esophagus, unspecified: Secondary | ICD-10-CM

## 2015-09-23 DIAGNOSIS — J9611 Chronic respiratory failure with hypoxia: Secondary | ICD-10-CM

## 2015-09-23 DIAGNOSIS — C155 Malignant neoplasm of lower third of esophagus: Secondary | ICD-10-CM | POA: Diagnosis present

## 2015-09-23 HISTORY — DX: Malignant neoplasm of esophagus, unspecified: C15.9

## 2015-09-23 NOTE — Patient Instructions (Addendum)
Ok to leave off  symbicort   Work on inhaler technique:  relax and gently blow all the way out then take a nice smooth deep breath back in, triggering the inhaler at same time you start breathing in.  Hold for up to 5 seconds if you can. Blow out thru nose. Rinse and gargle with water when done     Plan A = no maintence treatment - stiolto might be an option to try late  Plan B = Backup Only use your albuterol (proair)as a rescue medication to be used if you can't catch your breath by resting or doing a relaxed purse lip breathing pattern.  - The less you use it, the better it will work when you need it. - Ok to use up to 2 puffs  every 4 hours if you must but call for immediate appointment if use goes up over your usual need - Don't leave home without it !!  (think of it like the spare tire for your car)   Plan C = Crisis Only use your nebulizer albuterol as a backup to plan B (use proair first and if it doesn't work, then use the nebulizer) ok to use up to every 4 hours if you must    Always use 4lpm with walking and 3lpm 24//7 otherwise    Please schedule a follow up visit in 3 months but call sooner if needed

## 2015-09-23 NOTE — Progress Notes (Signed)
Subjective:     Patient ID: [name], male   DOB: 18-Jun-1931    MRN: [id number]    Brief patient profile:  80 yowm hungarian quit smoking 2008  s/p L lung surgery 2008 for MAI  [place] only with GOLD III  copd/ chronic resp failure intol of LAMA dpi    History of Present Illness  10/05/2014 1st [facility] office visit/ [name]   Chief Complaint  Patient presents with  . Follow-up    Former pt of Dr. Gwenette Bell. Pt states his breathing seems worse since his last visit. He states that he is not using rescue inhaler or neb.     Doe x Mailbox and back sometimes s 02/ quite variable doe/ never at rest or sleeping / walks slow pace = mmrc 2  Never use 02 sitting/   Sleep on 3lpm /walking on 3lpm rec Try dulera 200 Take 2 puffs first thing in am and then another 2 puffs about 12 hours later instead of advair      11/21/2014 f/u ov/[name] re: GOLD III COPD  Chief Complaint  Patient presents with  . Follow-up    Pt states his breathing is about the same.  He states Ruthe Mannan works better than the Advair but had trouble with insurance approving med.    doe continues MMRC =2 even on 02  rec Stop advair and dulera for now  Plan A = [name] = symbicort 160 Take 2 puffs first thing in am and then another 2 puffs about 12 hours later  Plan B = Backup Only use your albuterol as a rescue medication  Plan C = Crisis Only use your nebulizer albuterol as a backup to plan B (use proair first and if it doesn't work, then use the nebulizer)   04/05/2015  f/u ov/[name] re: GOLD III copd  Chief Complaint  Patient presents with  . COPD    Breathing is worse since last OV. Reports increased SOB and wheezing. Denies chest tightness and coughing.  not even using neb once a month rec Stop symbicort  Plan A = [name] = BEVESPI Take 2 puffs first thing in am and then another 2 puffs about 12 hours later - if not better resume the symbicort  Plan B = Backup Only use your albuterol (proair)as a rescue  medication  Plan C = Crisis Only use your nebulizer albuterol as a backup to plan B (use proair first and if it doesn't work, then use the nebulizer) ok to use up to every 4 hours if you must  Prednisone 10 mg take  4 each am x 2 days,   2 each am x 2 days,  1 each am x 2 days and stop (called in already)  Always use 4lpm with walking   05/17/2015  f/u ov/[name] re: GOLD III copd/ 02 dep 3lpm at rest/ 4lpm with walking maint rx = symbicort 160 2bid  Chief Complaint  Patient presents with  . Follow-up    Pt c/o increasing SOB, wheeze and occasional cough. Pt denies CP/tightness.    doe now room to room even on 4lpm / no better on bevespi even with incruse   >incruse ./cXR chronic changes  06/10/2015 Follow up : COPD GOLD III , O2 dependent  Pt returns for 1 month follow up .  Seen last ov with more dyspnea. Given trial of incruse but  Did not lke incruse.  Taking Symbicort Twice daily   Uses oxygen 3l/m rest and 5 l  with prolonged walking /distance.  rec Continue on Symbicort, rinse after use .  Continue on 02 is 3lpm at rest/ sleeping and 4lpm walking to mailbox and any walk greater than to mailbox is 5lpm   09/19/15  Dx Es Ca > for RT     09/23/2015  f/u ov/[name] re: copd GOLD III / 02 dep no longer on maint rx  Chief Complaint  Patient presents with  . Follow-up    Breathing has been worse since the last visit. He is using albuterol inhaler 2 x per wk on average.    doe x room to room even on 02 - no change off symbicort  No obvious day to day or daytime variability or assoc excess/ purulent sputum or mucus plugs or hemoptysis or cp or chest tightness, subjective wheeze or overt sinus or hb symptoms. No unusual exp hx or h/o childhood pna/ asthma or knowledge of premature birth.  Sleeping ok without nocturnal  or early am exacerbation  of respiratory  c/o's or need for noct saba. Also denies any obvious fluctuation of symptoms with weather or environmental changes or other aggravating  or alleviating factors except as outlined above   Current Medications, Allergies, Complete Past Medical History, Past Surgical History, Family History, and Social History were reviewed in [employer] record.  ROS  The following are not active complaints unless bolded sore throat, dysphagia, dental problems, itching, sneezing,  nasal congestion or excess/ purulent secretions, ear ache,   fever, chills, sweats, unintended wt loss, classically pleuritic or exertional cp,  orthopnea pnd or leg swelling, presyncope, palpitations, abdominal pain, anorexia, nausea, vomiting, diarrhea  or change in bowel or bladder habits, change in stools or urine, dysuria,hematuria,  rash, arthralgias, visual complaints, headache, numbness, weakness or ataxia or problems with walking or coordination,  change in mood/affect or memory.                     Objective:  Physical Exam   amb wm nad on 02    11/21/2014        168 > 01/03/2015    172 > 04/05/2015 172  > 05/17/2015  174 > 09/23/2015   160    Vital signs reviewed   HEENT: full dentures/ nl  turbinates, and orophanx. Nl external ear canals without cough reflex   NECK :  without JVD/Nodes/TM/ nl carotid upstrokes bilaterally      LUNGS: no acc muscle use, decreased  bs bilaterally  CV:  RRR  no s3 or murmur or increase in P2, no edema   ABD:  Obese mod distended and soft and nontender with late insp hoover's  in the supine position. No bruits or organomegaly, bowel sounds nl  MS:  warm without deformities, calf tenderness, cyanosis or clubbing  SKIN: warm and dry without lesions    NEURO:  alert, approp, no deficits            Assessment:

## 2015-09-23 NOTE — Progress Notes (Addendum)
Patient arrived via w/c on 3 litters oxygen N/C=91%, meds updated, Esophageal ca HX 3 bypass ,   8:19 AM BP 122/64 mmHg  Pulse 85  Temp(Src) 98.5 F (36.9 C) (Oral)  Resp 24  Ht 5\' 7"  (1.702 m)  Wt 159 lb 14.4 oz (72.53 kg)  BMI 25.04 kg/m2  SpO2 91%  Wt Readings from Last 3 Encounters:  09/23/15 159 lb 14.4 oz (72.53 kg)  09/19/15 163 lb (73.936 kg)  06/10/15 163 lb (73.936 kg)

## 2015-09-23 NOTE — Assessment & Plan Note (Signed)
PFT's 2008:  FEV1 1.17 (46%), ratio 43, TLC nl, DLCO 55% Spiriva trial:  "made me worse".  Trial of tudorza 09/2011:  Less sleep disturbance, not better doe  - 10/05/2014   try dulera 200 > insurance would not pay, changed back to advair > worse 11/21/2014 - 11/21/2014   try symbicort 160 2bid  - PFT's  01/03/2015  FEV1 0.90 (39 % ) ratio 42  p no % improvement from saba with DLCO  27 % corrects to 46 % for alv volume  - 04/05/15 trial of bevespi no better > changed back to symbicort 160 2bid   - 05/17/2015 trial Incruse > could not tolerate  - 09/23/2015 reported stopped symbicort 160 x 2 weeks and no change doe or rescue  - 09/23/2015  After extensive coaching HFA effectiveness =    90%   Doing the same on symbicort as off it so ok to leave off and monitor symptoms and need for saba   I had an extended discussion with the patient reviewing all relevant studies completed to date and  lasting 15 to 20 minutes of a 25 minute visit    Each maintenance medication was reviewed in detail including most importantly the difference between maintenance and prns and under what circumstances the prns are to be triggered using an action plan format that is not reflected in the computer generated alphabetically organized AVS.    Please see instructions for details which were reviewed in writing and the patient given a copy highlighting the part that I personally wrote and discussed at today's ov.

## 2015-09-23 NOTE — Assessment & Plan Note (Signed)
10/05/2014   Walked 3lpm x one lap @ 185 stopped due to  sats 86% at nl pace but no sob  11/21/2014  Only walked about 10 ft RA before desat  - 05/17/2015   Walked 4lpm  2 laps @ 185 ft each stopped due to  Sob/ slow pace with sats dipping down but ending at 88%    09/23/2015    rec 3lpm 24/7 except  4lpm with activity and 5lpm with exertion (beyond mb walking )

## 2015-09-25 ENCOUNTER — Telehealth: Payer: Self-pay | Admitting: *Deleted

## 2015-09-25 NOTE — Addendum Note (Signed)
Encounter addended by: Kyung Rudd, MD on: 09/25/2015  9:44 PM<BR>     Documentation filed: LOS Section, Follow-up Section

## 2015-09-25 NOTE — Telephone Encounter (Signed)
CALLED PATIENT TO INFORM OF PET SCAN FOR 09-27-15- ARRIVAL TIME - 7:30 AM, @ [facility], NPO 6 HRS. PRIOR TO TEST AND HIS APPT. WITH DR. Paulita Fujita ON 09-30-15 @ 1 PM, NUTRITION APPT. ON 10-03-15 @ 4 PM WITH BARBARA NEFF AND HIS APPT. WITH DR. Servando Snare ON 10-08-15 @ 4 PM, SPOKE WITH PATIENT AND HE IS AWARE OF THESE APPTS.

## 2015-09-26 ENCOUNTER — Ambulatory Visit (HOSPITAL_BASED_OUTPATIENT_CLINIC_OR_DEPARTMENT_OTHER): Payer: Medicare Other | Admitting: Hematology

## 2015-09-26 ENCOUNTER — Telehealth: Payer: Self-pay | Admitting: Hematology

## 2015-09-26 ENCOUNTER — Encounter: Payer: Self-pay | Admitting: Hematology

## 2015-09-26 ENCOUNTER — Other Ambulatory Visit (HOSPITAL_BASED_OUTPATIENT_CLINIC_OR_DEPARTMENT_OTHER): Payer: Medicare Other

## 2015-09-26 VITALS — BP 127/66 | HR 78 | Temp 97.9°F | Resp 18 | Ht 65.5 in | Wt 159.1 lb

## 2015-09-26 DIAGNOSIS — E538 Deficiency of other specified B group vitamins: Secondary | ICD-10-CM

## 2015-09-26 DIAGNOSIS — C159 Malignant neoplasm of esophagus, unspecified: Secondary | ICD-10-CM

## 2015-09-26 DIAGNOSIS — D649 Anemia, unspecified: Secondary | ICD-10-CM

## 2015-09-26 DIAGNOSIS — C155 Malignant neoplasm of lower third of esophagus: Secondary | ICD-10-CM | POA: Diagnosis not present

## 2015-09-26 DIAGNOSIS — I2510 Atherosclerotic heart disease of native coronary artery without angina pectoris: Secondary | ICD-10-CM

## 2015-09-26 LAB — COMPREHENSIVE METABOLIC PANEL
ALBUMIN: 4 g/dL (ref 3.5–5.0)
ALK PHOS: 67 U/L (ref 40–150)
ALT: 9 U/L (ref 0–55)
ANION GAP: 12 meq/L — AB (ref 3–11)
AST: 14 U/L (ref 5–34)
BILIRUBIN TOTAL: 0.72 mg/dL (ref 0.20–1.20)
BUN: 13.8 mg/dL (ref 7.0–26.0)
CO2: 29 mEq/L (ref 22–29)
Calcium: 10.4 mg/dL (ref 8.4–10.4)
Chloride: 105 mEq/L (ref 98–109)
Creatinine: 1.1 mg/dL (ref 0.7–1.3)
EGFR: 61 mL/min/{1.73_m2} — AB (ref >90)
Glucose: 102 mg/dl (ref 70–140)
POTASSIUM: 4.3 meq/L (ref 3.5–5.1)
SODIUM: 146 meq/L — AB (ref 136–145)
TOTAL PROTEIN: 7.7 g/dL (ref 6.4–8.3)

## 2015-09-26 LAB — CBC & DIFF AND RETIC
BASO%: 0.4 % (ref 0.0–2.0)
BASOS ABS: 0 10*3/uL (ref 0.0–0.1)
EOS ABS: 0.1 10*3/uL (ref 0.0–0.5)
EOS%: 1.3 % (ref 0.0–7.0)
HCT: 41 % (ref 38.4–49.9)
HEMOGLOBIN: 13.6 g/dL (ref 13.0–17.1)
IMMATURE RETIC FRACT: 5.5 % (ref 3.00–10.60)
LYMPH%: 23.6 % (ref 14.0–49.0)
MCH: 29 pg (ref 27.2–33.4)
MCHC: 33.2 g/dL (ref 32.0–36.0)
MCV: 87.4 fL (ref 79.3–98.0)
MONO#: 0.8 10*3/uL (ref 0.1–0.9)
MONO%: 9.9 % (ref 0.0–14.0)
NEUT%: 64.8 % (ref 39.0–75.0)
NEUTROS ABS: 4.9 10*3/uL (ref 1.5–6.5)
NRBC: 0 % (ref 0–0)
PLATELETS: 274 10*3/uL (ref 140–400)
RBC: 4.69 10*6/uL (ref 4.20–5.82)
RDW: 12.7 % (ref 11.0–14.6)
Retic %: 0.91 % (ref 0.80–1.80)
Retic Ct Abs: 42.68 10*3/uL (ref 34.80–93.90)
WBC: 7.6 10*3/uL (ref 4.0–10.3)
lymph#: 1.8 10*3/uL (ref 0.9–3.3)

## 2015-09-26 LAB — FERRITIN: FERRITIN: 31 ng/mL (ref 22–316)

## 2015-09-26 LAB — MAGNESIUM: MAGNESIUM: 2 mg/dL (ref 1.5–2.5)

## 2015-09-26 NOTE — Progress Notes (Signed)
[name]    HEMATOLOGY/ONCOLOGY CONSULTATION NOTE  Date of Service: 09/26/2015  Patient Care Team: [name], MD as PCP - General (Family Medicine) Kathee Delton, MD (Pulmonary Disease) Adrian Prows, MD as Attending Physician (Cardiology)  CHIEF COMPLAINTS/PURPOSE OF CONSULTATION:  Newly diagnosed Esophageal squamous cell carcinoma  HISTORY OF PRESENTING ILLNESS:   [name] is a wonderful 82 y.o. male who has been referred to [place] by Dr .[name], MD for evaluation and management of newly diagnosed lower esophageal squamous cell carcinoma.  Patient is originally from [place] but speaks good [place]. He has multiple medical comorbidities including coronary disease status post MI and CABG with ischemic cardiomyopathy ejection fraction 25% in 2009, COPD on chronic O2 at 3 L/min, history of pulmonary nodule wedge resection in 2008 which was positive for MAC (patient notes he has been on oxygen since), history of DVT in 2009 status post cardiac bypass, hypertension, peripheral vascular disease, B12 deficiency.  Patient notes that he still mows his yard and is quite functionally active even with his multiple medical comorbidities.  He notes that he developed increasing dysphagia to solids first noticed about 2-2.5 months ago. This was associated with about a 15-20 pound weight loss. Patient notes that he has some evident dysphagia to liquids as well now. Some food items "go down" better than others. He also notes a sensation of food getting stuck and fullness in his lower retrosternal area.  Reports no hematemesis. No melena. No significant abdominal pain.  Patient had a vasovagal [name] for further evaluation on 09/12/2015 that identified a constricting lesion in the mid distal esophagus with mucosal irregularity most consistent with an esophageal carcinoma over several centimeters length. He was also noted to have a paraesophageal hernia.  Patient subsequently had an EGD on 09/19/2015 with Dr.  Watt Climes revealing a large ulcerating mass with bleeding in the lower third of the esophagus, 32 cm from the incisors. [name] was partially obstructing and circumferential. Biopsy of the distal esophageal mass is consistent with a squamous cell carcinoma with areas of invasion.  Patient has been set up for a PET/CT scan for staging tomorrow morning. He has also been set up for a nutritional consultation with Ernestene Kiel on 10/03/2015 and a surgical consultation with Dr. Servando Snare on 10/08/2015.  Patient reports no other acute new symptoms at this time. He is able to eat chocolate pudding and pureed food items and liquids. No overt aspiration events. He does however report that he tried to eat fried chicken and felt like it was getting stuck and had to regurgitate it.    MEDICAL HISTORY:  Past Medical History  Diagnosis Date  . Pulmonary nodule     s/p wedge resction, inflammatory, +MAC  . Emphysema   . Cardiomyopathy     EF 25% by echo 2009  . DVT (deep venous thrombosis) ([place])   . CAD (coronary artery disease)     Dr Einar Gip every 6 months  . Hypertension   . Peripheral vascular disease ([place])   . Myocardial infarction ([place]) 1986  . Shortness of breath   . COPD (chronic obstructive pulmonary disease) ([place])     on O2 constantly  . Keeps losing balance   . Anemia   . Vitamin B 12 deficiency   . Carotid artery occlusion   . History of oxygen administration     Oxygen 3 l/m at rest / 4 l/m when active. 24/7  . Esophageal cancer [facility])     'causes chest discomfort, liquids and  solids"    SURGICAL HISTORY: Past Surgical History  Procedure Laterality Date  . Carotid endarterectomy  2009    triple bypass  . Lung surgery  2008    tumor"infection"  . Coronary artery bypass graft  2009  . Vascular surgery    . Carotid-vertebral bypass graft  08/20/2011    Procedure: BYPASS GRAFT CAROTID-VERTEBRAL;  Surgeon: Serafina Mitchell, MD;  Location: [facility] OR;  Service: Vascular;  Laterality: Right;  Right  CAROTID-VERTEBRAL TRANSPOSITION  . Pr vein bypass graft,aorto-fem-pop    . Arch aortogram N/A 07/21/2011    Procedure: ARCH AORTOGRAM;  Surgeon: Laverda Page, MD;  Location[facility] CATH LAB;  Service: Cardiovascular;  Laterality: N/A;  . Esophagogastroduodenoscopy (egd) with propofol N/A 09/19/2015    Procedure: ESOPHAGOGASTRODUODENOSCOPY (EGD) WITH PROPOFOL;  Surgeon: Clarene Essex, MD;  Location: [facility] ENDOSCOPY;  Service: Endoscopy;  Laterality: N/A;  . Savory dilation N/A 09/19/2015    Procedure: SAVORY DILATION;  Surgeon: Clarene Essex, MD;  Location: [facility] ENDOSCOPY;  Service: Endoscopy;  Laterality: N/A;  needs carm    SOCIAL HISTORY: Social History   Social History  . Marital Status: Divorced    Spouse Name: N/A  . Number of Children: N/A  . Years of Education: N/A   Occupational History  . Retired     Charity fundraiser   Social History Main Topics  . Smoking status: Former Smoker -- 2.00 packs/day for 56 years    Types: Cigarettes    Quit date: 03/30/2006  . Smokeless tobacco: Never Used  . Alcohol Use: 2.4 oz/week    4 Standard drinks or equivalent per week  . Drug Use: No  . Sexual Activity: Not on file   Other Topics Concern  . Not on file   Social History Narrative    FAMILY HISTORY: Family History  Problem Relation Age of Onset  . Heart disease Father   . Diabetes Sister   . Anesthesia problems Neg Hx     ALLERGIES:  is allergic to symbicort and tetanus toxoids.  MEDICATIONS:  Current Outpatient Prescriptions  Medication Sig Dispense Refill  . albuterol (PROAIR HFA) 108 (90 BASE) MCG/ACT inhaler Inhale 2 puffs into the lungs every 6 (six) hours as needed. For shortness of breath 1 Inhaler 2  . albuterol (PROVENTIL) (2.5 MG/3ML) 0.083% nebulizer solution Take 3 mLs (2.5 mg total) by nebulization 4 (four) times daily as needed. For shortness of breath 75 mL 11  . aspirin 81 MG chewable tablet Chew 81 mg by mouth daily.    [name] atorvastatin (LIPITOR) 40 MG tablet Take 40 mg by  mouth daily.    . cholecalciferol (VITAMIN D) 1000 units tablet Take 1,000 Units by mouth daily.    . clopidogrel (PLAVIX) 75 MG tablet Take 75 mg by mouth daily.     . folic acid (FOLVITE) 1 MG tablet Take 1 mg by mouth daily.     [name] losartan (COZAAR) 25 MG tablet Take 25 mg by mouth daily.    . metoprolol tartrate (LOPRESSOR) 25 MG tablet Take 25 mg by mouth daily.    . SYMBICORT 160-4.5 MCG/ACT inhaler Inhale 2 puffs into the lungs 2 (two) times daily. (Patient not taking: Reported on 09/23/2015) 1 Inhaler 5  . vitamin B-12 (CYANOCOBALAMIN) 1000 MCG tablet Take 1,000 mcg by mouth daily.     No current facility-administered medications for this visit.    REVIEW OF SYSTEMS:    10 Point review of Systems was done is negative except as noted  above.  PHYSICAL EXAMINATION: ECOG PERFORMANCE STATUS:1-2 . Filed Vitals:   09/26/15 0839  BP: 127/66  Pulse: 78  Temp: 97.9 F (36.6 C)  Resp: 18   Filed Weights   09/26/15 0839  Weight: 159 lb 1.6 oz (72.167 kg)   .Body mass index is 26.06 kg/(m^2).  GENERAL:alert, in no acute distress and comfortable SKIN: skin color, texture, turgor are normal, no rashes or significant lesions EYES: normal, conjunctiva are pink and non-injected, sclera clear OROPHARYNX:no exudate, no erythema and lips, buccal mucosa, and tongue normal  NECK: supple, no JVD, thyroid normal size, non-tender, without nodularity LYMPH:  no palpable lymphadenopathy in the cervical, axillary or inguinal LUNGS: clear to auscultation with Decreased air entry bilaterally.  HEART: regular rate & rhythm,  no murmurs and no lower extremity edema ABDOMEN: abdomen soft, non-tender, normoactive bowel sounds  Musculoskeletal: no cyanosis of digits and no clubbing  PSYCH: alert & oriented x 3 with fluent speech NEURO: no focal motor/sensory deficits  LABORATORY DATA:  I have reviewed the data as listed  . CBC Latest Ref Rng 09/26/2015 08/21/2011 08/20/2011  WBC 4.0 - 10.3 10e3/uL  7.6 7.4 -  Hemoglobin 13.0 - 17.1 g/dL 13.6 11.4(L) 12.2(L)  Hematocrit 38.4 - 49.9 % 41.0 33.5(L) 36.0(L)  Platelets 140 - 400 10e3/uL 274 161 -    . CMP Latest Ref Rng 09/26/2015 08/21/2011 08/20/2011  Glucose 70 - 140 mg/dl 102 96 -  BUN 7.0 - 26.0 mg/dL 13.8 18 -  Creatinine 0.7 - 1.3 mg/dL 1.1 1.05 -  Sodium 136 - 145 mEq/L 146(H) 139 142  Potassium 3.5 - 5.1 mEq/L 4.3 4.0 4.9  Chloride 96 - 112 mEq/L - 107 -  CO2 22 - 29 mEq/L 29 24 -  Calcium 8.4 - 10.4 mg/dL 10.4 8.2(L) -  Total Protein 6.4 - 8.3 g/dL 7.7 - -  Total Bilirubin 0.20 - 1.20 mg/dL 0.72 - -  Alkaline Phos 40 - 150 U/L 67 - -  AST 5 - 34 U/L 14 - -  ALT 0 - 55 U/L 9 - -     RADIOGRAPHIC STUDIES:   EGD 09/19/2015  - Normal larynx. - Partially obstructing, malignant esophageal tumor was found in the lower third of the   esophagus. Biopsied. - Normal upper third of esophagus and middle third of esophagus. - The examination was otherwise normal. but unable to pass scope past the mass  I have personally reviewed the radiological images as listed and agreed with the findings in the report. Dg Esophagus  09/12/2015  CLINICAL DATA:  Dysphagia, loss of 4 8 EXAM: ESOPHOGRAM/BARIUM SWALLOW TECHNIQUE: Single contrast examination was performed using  thin barium. FLUOROSCOPY TIME:  Radiation Exposure Index (as provided by the fluoroscopic device): 47 deciGy per square cm If the device does not provide the exposure index: Fluoroscopy Time:  1 minutes 12 seconds Number of Acquired Images: COMPARISON:  None. FINDINGS: The study was begun in the erect position with AP and lateral views of the cervical esophagus obtained. The swallowing mechanism is unremarkable. The does appear to be a small right lateral lower ring conceal present. No aspiration or penetration is seen. There are mild tertiary contractions in the mid and distal esophagus. However, a few cm below the level of the carina there is a constricting lesion of the mid  distal esophagus with mucosal irregularity consistent with esophageal carcinoma over several cm length. No obstruction to the passage of barium is seen. However in view of this finding a  barium pill was not given. On delayed images there does appear to be a paraesophageal hernia present. No gastroesophageal reflux could be elicited. IMPRESSION: 1. Constricting lesion of the mid distal esophagus with mucosal irregularity most consistent with esophageal carcinoma over several cm length. This lesion does not cause obstruction to the passage of barium. A barium pill however was not given. 2. Paraesophageal hernia. 3. Mild tertiary contractions. Electronically Signed   By: Ivar Drape M.D.   On: 09/12/2015 09:17    ASSESSMENT & PLAN:   82 year old [place] male with multiple medical comorbidities including significant coronary artery disease status post bypass and ischemic cardiomyopathy, COPD which is oxygen dependent at 3 L/min, peripheral vascular disease status post stenting of his lower extremity arteries on multiple antiplatelet therapies now with.  #1 Newly diagnosed lower third of the esophagus invasive squamous cell carcinoma. His previous heavy alcohol use and smoking are the likely etiology. He notes that he has quit heavy smoking and drinks only 2-3 beers a week since his cardiac bypass surgery in 2009. Patient has an ECOG performance status of 2 Plan -I discussed the patient's diagnosis, evaluation process, decision making processes for treatment in detail with the patient and his son. -We discussed that his nutritional status is going to be a key factor in addition to his overall medical status in determining treatment options. -PET/CT scan 09/27/2015 for staging. -Based on his significant medical, cardiovascular and pulmonary comorbidities he would be a poor candidate for surgery even if he had localized disease. -He has been scheduled for a surgical evaluation by Dr. Servando Snare on 10/08/2015.    -If he has localized disease and is deemed to be a candidate for potential surgery then he might benefit from an endoscopic ultrasound for local staging of his tumor. -If surgery is off the table and the patient has localized disease would be looking at palliative radiation v/s definitive chemoradiation( if there isn't much chance of additional pulmonary compromise). -if the patient has metastatic disease would likely need to proceed with palliative radiation alone followed by a reassessment regarding palliative chemotherapy. -Once a decision regarding surgery is taken we could consider possible placement of a palliative esophageal stent if no objections from Radiation oncology. - Considering the EGD scope could not traverse the obstruction at this time will not be able to have GI place a PEG tube. IR or surgery could consider a G-tube/GJ tube however interruption of his antiplatelet therapy might be somewhat challenging. -Alternatively we could put a temporary fluoroscopically guided [place] tube. -Patient is going to meet Ernestene Kiel for his nutritional evaluation on 10/03/2015 for further input regarding nutritional needs. -We counseled not to ingest solids except if they are pureed due to high risk of food impaction.   #2 CAD status post CABG, ischemic cardiomyopathy ejection fraction 25% in 2009.  #3 peripheral vascular disease. Has stents in his lower extremity. Currently on aspirin plus Plavix plus cilostazol.  #4 ex-smoker more than 100-pack-year history of smoking. #5 Ex heavy alcohol use #6 COPD with chronic hypoxic respiratory failure on 3 L/min oxygen by nasal cannula.  All of the patients questions were answered with apparent satisfaction. The patient knows to call the clinic with any problems, questions or concerns.  I spent 70 minutes counseling the patient face to face. The total time spent in the appointment was 80 minutes and more than 50% was on counseling and direct patient  cares.    Sullivan Lone MD MS AAHIVMS [facility] Hematology/Oncology Physician [facility]  [place]  (Office):       [phone] (Work cell):  [phone] (Fax):           [phone]  09/26/2015 8:48 AM

## 2015-09-26 NOTE — Telephone Encounter (Signed)
Gave and printed appt sched and avs for pt for July  °

## 2015-09-27 ENCOUNTER — Ambulatory Visit
Admission: RE | Admit: 2015-09-27 | Discharge: 2015-09-27 | Disposition: A | Discharge status: Home / Self Care | Payer: Medicare Other | Source: Ambulatory Visit | Attending: Radiation Oncology | Admitting: Radiation Oncology

## 2015-09-27 ENCOUNTER — Ambulatory Visit (HOSPITAL_COMMUNITY)
Admission: RE | Admit: 2015-09-27 | Discharge: 2015-09-27 | Disposition: A | Discharge status: Home / Self Care | Payer: Medicare Other | Source: Ambulatory Visit | Attending: Radiation Oncology | Admitting: Radiation Oncology

## 2015-09-27 DIAGNOSIS — C155 Malignant neoplasm of lower third of esophagus: Secondary | ICD-10-CM

## 2015-09-27 DIAGNOSIS — C159 Malignant neoplasm of esophagus, unspecified: Secondary | ICD-10-CM | POA: Diagnosis not present

## 2015-09-27 DIAGNOSIS — Z510 Encounter for antineoplastic radiation therapy: Secondary | ICD-10-CM | POA: Diagnosis not present

## 2015-09-27 DIAGNOSIS — D3501 Benign neoplasm of right adrenal gland: Secondary | ICD-10-CM | POA: Diagnosis not present

## 2015-09-27 DIAGNOSIS — J439 Emphysema, unspecified: Secondary | ICD-10-CM | POA: Diagnosis not present

## 2015-09-27 DIAGNOSIS — K8020 Calculus of gallbladder without cholecystitis without obstruction: Secondary | ICD-10-CM | POA: Insufficient documentation

## 2015-09-27 DIAGNOSIS — I2510 Atherosclerotic heart disease of native coronary artery without angina pectoris: Secondary | ICD-10-CM | POA: Diagnosis not present

## 2015-09-27 DIAGNOSIS — I7090 Unspecified atherosclerosis: Secondary | ICD-10-CM | POA: Insufficient documentation

## 2015-09-27 DIAGNOSIS — R590 Localized enlarged lymph nodes: Secondary | ICD-10-CM | POA: Insufficient documentation

## 2015-09-27 DIAGNOSIS — I700 Atherosclerosis of aorta: Secondary | ICD-10-CM | POA: Insufficient documentation

## 2015-09-27 DIAGNOSIS — Z951 Presence of aortocoronary bypass graft: Secondary | ICD-10-CM | POA: Diagnosis not present

## 2015-09-27 LAB — PHOSPHORUS: Phosphorus, Ser: 2.7 mg/dL (ref 2.5–4.5)

## 2015-09-27 LAB — VITAMIN B12: Vitamin B12: >2000 pg/mL — ABNORMAL HIGH (ref 211–946)

## 2015-09-27 LAB — PREALBUMIN: PREALBUMIN: 19 mg/dL (ref 9–32)

## 2015-09-27 LAB — GLUCOSE, CAPILLARY: Glucose-Capillary: 109 mg/dL — ABNORMAL HIGH (ref 65–99)

## 2015-09-27 MED ORDER — FLUDEOXYGLUCOSE F - 18 (FDG) INJECTION
7.8000 | Freq: Once | INTRAVENOUS | Status: AC | PRN
  Administered 2015-09-27: 7.8 via INTRAVENOUS

## 2015-10-03 ENCOUNTER — Ambulatory Visit: Payer: Medicare Other | Admitting: Nutrition

## 2015-10-03 ENCOUNTER — Other Ambulatory Visit: Payer: Self-pay | Admitting: Nurse Practitioner

## 2015-10-03 ENCOUNTER — Ambulatory Visit (HOSPITAL_BASED_OUTPATIENT_CLINIC_OR_DEPARTMENT_OTHER): Payer: Medicare Other | Admitting: Nurse Practitioner

## 2015-10-03 ENCOUNTER — Other Ambulatory Visit: Payer: Self-pay | Admitting: Hematology

## 2015-10-03 DIAGNOSIS — R630 Anorexia: Secondary | ICD-10-CM | POA: Diagnosis not present

## 2015-10-03 DIAGNOSIS — E860 Dehydration: Secondary | ICD-10-CM

## 2015-10-03 DIAGNOSIS — C159 Malignant neoplasm of esophagus, unspecified: Secondary | ICD-10-CM

## 2015-10-03 DIAGNOSIS — R634 Abnormal weight loss: Secondary | ICD-10-CM | POA: Diagnosis not present

## 2015-10-03 MED ORDER — DEXAMETHASONE 4 MG PO TABS: 8.0000 mg | ORAL_TABLET | Freq: Every day | ORAL | Status: DC

## 2015-10-03 MED ORDER — LIDOCAINE-PRILOCAINE 2.5-2.5 % EX CREA: TOPICAL_CREAM | CUTANEOUS | Status: DC

## 2015-10-03 MED ORDER — ONDANSETRON HCL 8 MG PO TABS: 8.0000 mg | ORAL_TABLET | Freq: Two times a day (BID) | ORAL | Status: DC | PRN

## 2015-10-03 MED ORDER — PROCHLORPERAZINE MALEATE 10 MG PO TABS: 10.0000 mg | ORAL_TABLET | Freq: Four times a day (QID) | ORAL | Status: DC | PRN

## 2015-10-03 NOTE — Progress Notes (Signed)
82 year old male diagnosed with esophageal cancer with partial obstruction of his esophagus.  He is a patient of Dr. Irene Limbo.  Past medical history includes CAD status post MI and CABG, COPD on O2, DVT, hypertension, PVD, and B12 deficiency.  Medications include Lipitor, vitamin D, Plavix, and vitamin B12.  Labs include sodium 146, albumin 4.0 on June 29.  Height: 65.5 inches. Weight: 148.8 pounds July 6. This reflects a 10 pound weight loss over 1 week. Usual body weight: 173 pounds in February 2017. BMI: 24.38.  Patient presents to nutrition consult with his son. He states 2 weeks ago he was able eat pizza and watermelon. Reports he has been unable to tolerate anything over the past 2 weeks including water.  Reports liquids and solids come back up after he consumes them. Admits he was able to eat some strawberry ice cream last night. Reports urine is "normal" color. Reports very small liquid bowel movement this morning.  Nutrition diagnosis:   Severe malnutrition related to 14% weight loss over 5 months, less than 75% energy intake for greater than one month and depletion of both body fat and muscle stores on physical exam.    Estimated nutrition needs: 1900-2100 calories, 82-95 g protein, 2 L fluid.  Intervention: Since patient is unable to keep solids or liquids down, patient was referred to nurse practitioner for assessment. If patient cannot improve oral intake secondary to obstruction and patient is not a surgical candidate, recommend gastric feeding tube so patient can receive bolus feedings for nutrition support. If tube feedings initiated recommend Jevity 1.5 with slow advancement to goal rate of 6 cans daily to provide 2130 cal, 91 g protein, 1080 mL free water. Patient is at risk for refeeding syndrome secondary to cancer diagnosis and greater than 2 week decreased oral intake. Recommend CMET, magnesium, and phosphorus to monitor for refeeding syndrome.  Monitoring,  evaluation, goals: Patient will improve oral intake or tolerate tube feedings to meet greater than 90% estimated needs.  Next visit: To be scheduled.  **Disclaimer: This note was dictated with voice recognition software. Similar sounding words can inadvertently be transcribed and this note may contain transcription errors which may not have been corrected upon publication of note.**

## 2015-10-04 ENCOUNTER — Encounter: Payer: Self-pay | Admitting: Nurse Practitioner

## 2015-10-04 ENCOUNTER — Other Ambulatory Visit: Payer: Self-pay | Admitting: Nurse Practitioner

## 2015-10-04 ENCOUNTER — Ambulatory Visit (HOSPITAL_BASED_OUTPATIENT_CLINIC_OR_DEPARTMENT_OTHER): Payer: Medicare Other | Admitting: Nurse Practitioner

## 2015-10-04 ENCOUNTER — Ambulatory Visit (HOSPITAL_BASED_OUTPATIENT_CLINIC_OR_DEPARTMENT_OTHER): Payer: Medicare Other

## 2015-10-04 ENCOUNTER — Other Ambulatory Visit (HOSPITAL_BASED_OUTPATIENT_CLINIC_OR_DEPARTMENT_OTHER): Payer: Medicare Other

## 2015-10-04 VITALS — BP 125/51 | HR 103 | Temp 97.6°F | Resp 20 | Ht 65.5 in | Wt 149.4 lb

## 2015-10-04 DIAGNOSIS — R634 Abnormal weight loss: Secondary | ICD-10-CM | POA: Insufficient documentation

## 2015-10-04 DIAGNOSIS — C159 Malignant neoplasm of esophagus, unspecified: Secondary | ICD-10-CM | POA: Diagnosis not present

## 2015-10-04 DIAGNOSIS — E860 Dehydration: Secondary | ICD-10-CM | POA: Diagnosis not present

## 2015-10-04 DIAGNOSIS — Z452 Encounter for adjustment and management of vascular access device: Secondary | ICD-10-CM

## 2015-10-04 DIAGNOSIS — Z510 Encounter for antineoplastic radiation therapy: Secondary | ICD-10-CM | POA: Diagnosis not present

## 2015-10-04 LAB — COMPREHENSIVE METABOLIC PANEL
ALT: 12 U/L (ref 0–55)
AST: 18 U/L (ref 5–34)
Albumin: 4.2 g/dL (ref 3.5–5.0)
Alkaline Phosphatase: 64 U/L (ref 40–150)
Anion Gap: 14 mEq/L — ABNORMAL HIGH (ref 3–11)
BILIRUBIN TOTAL: 0.83 mg/dL (ref 0.20–1.20)
BUN: 39.8 mg/dL — AB (ref 7.0–26.0)
CHLORIDE: 108 meq/L (ref 98–109)
CO2: 28 meq/L (ref 22–29)
CREATININE: 1.7 mg/dL — AB (ref 0.7–1.3)
Calcium: 10.4 mg/dL (ref 8.4–10.4)
EGFR: 36 mL/min/{1.73_m2} — ABNORMAL LOW (ref >90)
GLUCOSE: 134 mg/dL (ref 70–140)
Potassium: 3.8 mEq/L (ref 3.5–5.1)
Sodium: 150 mEq/L — ABNORMAL HIGH (ref 136–145)
TOTAL PROTEIN: 7.9 g/dL (ref 6.4–8.3)

## 2015-10-04 LAB — CBC WITH DIFFERENTIAL/PLATELET
BASO%: 0.4 % (ref 0.0–2.0)
Basophils Absolute: 0 10*3/uL (ref 0.0–0.1)
EOS%: 1.3 % (ref 0.0–7.0)
Eosinophils Absolute: 0.1 10*3/uL (ref 0.0–0.5)
HCT: 42.6 % (ref 38.4–49.9)
HGB: 14.3 g/dL (ref 13.0–17.1)
LYMPH#: 1.7 10*3/uL (ref 0.9–3.3)
LYMPH%: 20.8 % (ref 14.0–49.0)
MCH: 29.2 pg (ref 27.2–33.4)
MCHC: 33.6 g/dL (ref 32.0–36.0)
MCV: 87.1 fL (ref 79.3–98.0)
MONO#: 0.9 10*3/uL (ref 0.1–0.9)
MONO%: 10.4 % (ref 0.0–14.0)
NEUT%: 67.1 % (ref 39.0–75.0)
NEUTROS ABS: 5.6 10*3/uL (ref 1.5–6.5)
Platelets: 235 10*3/uL (ref 140–400)
RBC: 4.89 10*6/uL (ref 4.20–5.82)
RDW: 12.7 % (ref 11.0–14.6)
WBC: 8.4 10*3/uL (ref 4.0–10.3)

## 2015-10-04 MED ORDER — SODIUM CHLORIDE 0.45 % IV SOLN
1000.0000 mL | Freq: Once | INTRAVENOUS | Status: AC
  Administered 2015-10-04: 1000 mL via INTRAVENOUS
  Filled 2015-10-04: qty 1000

## 2015-10-04 NOTE — Assessment & Plan Note (Signed)
Patient has recently been diagnosed with esophageal cancer.  He is scheduled to meet Dr. Servando Snare.  Gen. surgeon to see if he is a surgical candidate on 10/08/2015.  He is scheduled for his initial visit with radiation oncology on 10/09/2015; with the tentative plan to start radiation treatments on 10/10/2015.  Patient was initially scheduled for a follow-up visit with Dr. Irene Limbo on 10/09/2015; but this appointment will be moved up to Monday, 10/07/2015 so Dr. Irene Limbo can discuss the option of a feeding tube with the patient.

## 2015-10-04 NOTE — Progress Notes (Signed)
SYMPTOM MANAGEMENT CLINIC    Chief Complaint: Decreased oral intake, dehydration  HPI:  [name] 82 y.o. male diagnosed with esophageal cancer.  Scheduled to initiate radiation treatments next week.  Patient presented to the [facility] today with complaint of decreased oral intake, unintentional weight loss, and dehydration.    No history exists.    Review of Systems  Constitutional: Positive for weight loss and malaise/fatigue.  All other systems reviewed and are negative.   Past Medical History  Diagnosis Date  . Pulmonary nodule     s/p wedge resction, inflammatory, +MAC  . Emphysema   . Cardiomyopathy     EF 25% by echo 2009  . DVT (deep venous thrombosis) ([place])   . CAD (coronary artery disease)     Dr Einar Gip every 6 months  . Hypertension   . Peripheral vascular disease ([place])   . Myocardial infarction ([place]) 1986  . Shortness of breath   . COPD (chronic obstructive pulmonary disease) ([place])     on O2 constantly  . Keeps losing balance   . Anemia   . Vitamin B 12 deficiency   . Carotid artery occlusion   . History of oxygen administration     Oxygen 3 l/m at rest / 4 l/m when active. 24/7  . Esophageal cancer [facility])     'causes chest discomfort, liquids and solids"    Past Surgical History  Procedure Laterality Date  . Carotid endarterectomy  2009    triple bypass  . Lung surgery  2008    tumor"infection"  . Coronary artery bypass graft  2009  . Vascular surgery    . Carotid-vertebral bypass graft  08/20/2011    Procedure: BYPASS GRAFT CAROTID-VERTEBRAL;  Surgeon: Serafina Mitchell, MD;  Location: [facility] OR;  Service: Vascular;  Laterality: Right;  Right CAROTID-VERTEBRAL TRANSPOSITION  . Pr vein bypass graft,aorto-fem-pop    . Arch aortogram N/A 07/21/2011    Procedure: ARCH AORTOGRAM;  Surgeon: Laverda Page, MD;  Location: [facility] CATH LAB;  Service: Cardiovascular;  Laterality: N/A;  . Esophagogastroduodenoscopy (egd) with propofol N/A 09/19/2015   Procedure: ESOPHAGOGASTRODUODENOSCOPY (EGD) WITH PROPOFOL;  Surgeon: Clarene Essex, MD;  [facility]: [facility] ENDOSCOPY;  Service: Endoscopy;  Laterality: N/A;  . Savory dilation N/A 09/19/2015    Procedure: SAVORY DILATION;  Surgeon: Clarene Essex, MD;  Location: [facility] ENDOSCOPY;  Service: Endoscopy;  Laterality: N/A;  needs carm    has COPD  GOLD III ; Chronic respiratory failure with hypoxia ([place]); PULMONARY NODULE; Stricture of artery ([place]); Occlusion and stenosis of carotid artery without mention of cerebral infarction; Primary esophageal malignancy ([place]); Unintentional weight loss; and Dehydration on his problem list.    is allergic to symbicort and tetanus toxoids.    Medication List       This list is accurate as of: 10/03/15 11:59 PM.  Always use your most recent med list.               albuterol 108 (90 Base) MCG/ACT inhaler  Commonly known as:  PROAIR HFA  Inhale 2 puffs into the lungs every 6 (six) hours as needed. For shortness of breath     albuterol (2.5 MG/3ML) 0.083% nebulizer solution  Commonly known as:  PROVENTIL  Take 3 mLs (2.5 mg total) by nebulization 4 (four) times daily as needed. For shortness of breath     aspirin 81 MG chewable tablet  Chew 81 mg by mouth daily.     atorvastatin 40 MG tablet  Commonly known as:  LIPITOR  Take 40 mg by mouth daily.     cholecalciferol 1000 units tablet  Commonly known as:  VITAMIN D  Take 1,000 Units by mouth daily.     clopidogrel 75 MG tablet  Commonly known as:  PLAVIX  Take 75 mg by mouth daily.     dexamethasone 4 MG tablet  Commonly known as:  DECADRON  Take 2 tablets (8 mg total) by mouth daily. Start the day after chemotherapy for 2 days.     folic acid 1 MG tablet  Commonly known as:  FOLVITE  Take 1 mg by mouth daily.     lidocaine-prilocaine cream  Commonly known as:  EMLA  Apply to affected area once     losartan 25 MG tablet  Commonly known as:  COZAAR  Take 25 mg by mouth daily.     metoprolol tartrate  25 MG tablet  Commonly known as:  LOPRESSOR  Take 25 mg by mouth daily.     ondansetron 8 MG tablet  Commonly known as:  ZOFRAN  Take 1 tablet (8 mg total) by mouth 2 (two) times daily as needed for refractory nausea / vomiting. Start on day 3 after chemo.     prochlorperazine 10 MG tablet  Commonly known as:  COMPAZINE  Take 1 tablet (10 mg total) by mouth every 6 (six) hours as needed (Nausea or vomiting).     vitamin B-12 1000 MCG tablet  Commonly known as:  CYANOCOBALAMIN  Take 1,000 mcg by mouth daily.         PHYSICAL EXAMINATION  Oncology Vitals 10/04/2015 10/03/2015  Height 166 cm -  Weight 67.767 kg 67.495 kg  Weight (lbs) 149 lbs 6 oz 148 lbs 13 oz  BMI (kg/m2) 24.48 kg/m2 -  Temp 97.6 -  Pulse 103 -  Resp 20 -  SpO2 96 -  BSA (m2) 1.77 m2 -   BP Readings from Last 2 Encounters:  10/04/15 125/51  09/26/15 127/66    Physical Exam  Constitutional: He is oriented to person, place, and time. He appears malnourished and dehydrated. He appears unhealthy. He appears cachectic.  HENT:  Head: Normocephalic and atraumatic.  Eyes: Conjunctivae and EOM are normal. Pupils are equal, round, and reactive to light.  Neck: Normal range of motion.  Pulmonary/Chest: No respiratory distress.  O2 at 3 L nasal cannula.  Neurological: He is alert and oriented to person, place, and time.  Psychiatric: Affect normal.  Nursing note and vitals reviewed.   LABORATORY DATA:. No visits with results within 3 Day(s) from this visit. Latest known visit with results is:  Hospital Outpatient Visit on 09/27/2015  Component Date Value Ref Range Status  . Glucose-Capillary 09/27/2015 109* 65 - 99 mg/dL Final    RADIOGRAPHIC STUDIES: No results found.  ASSESSMENT/PLAN:    Unintentional weight loss Patient had an appointment with the nutritionist today; and then requested to be seen due to decreased oral intake, dehydration, and continued weight loss.  Patient did appear dehydrated  today; but in no acute distress.  Due to the late hour the stay-patient was given the option of transport to the emergency department for further evaluation this afternoon/evening; or returning to the [facility] first thing in the morning for labs and a full evaluation.  Patient refuses consideration of the emergency department this evening; and prefers to return in the morning for further evaluation.  Patient was advised to go directly to the emergency department overnight.  If he develops any worsening symptoms whatsoever.  Primary esophageal malignancy [facility]) Patient has recently been diagnosed with esophageal cancer.  He is scheduled to meet Dr. Servando Snare.  Gen. surgeon to see if he is a surgical candidate on 10/08/2015.  He is scheduled for his initial visit with radiation oncology on 10/09/2015; with the tentative plan to start radiation treatments on 10/10/2015.  Patient was initially scheduled for a follow-up visit with Dr. Irene Limbo on 10/09/2015; but this appointment will be moved up to Monday, 10/07/2015 so Dr. Irene Limbo can discuss the option of a feeding tube with the patient.    Dehydration Patient has had decreased oral intake and continued weight loss.  He states he is losing approximate 1 pound a day; with his total weight loss approximately 26 pounds since his cancer diagnosis.  He does appear dehydrated today; and will return to the cancer Center tomorrow to receive IV fluid rehydration.   Patient stated understanding of all instructions; and was in agreement with this plan of care. The patient knows to call the clinic with any problems, questions or concerns.   Total time spent with patient was 5 minutes;  with greater than 75 percent of that time spent in face to face counseling regarding patient's symptoms,  and coordination of care and follow up.  Disclaimer:This dictation was prepared with Dragon/digital dictation along with [employer]. Any transcriptional errors  that result from this process are unintentional.  Drue Second, NP 10/04/2015

## 2015-10-04 NOTE — Assessment & Plan Note (Signed)
Patient had an appointment with the nutritionist today; and then requested to be seen due to decreased oral intake, dehydration, and continued weight loss.  Patient did appear dehydrated today; but in no acute distress.  Due to the late hour the stay-patient was given the option of transport to the emergency department for further evaluation this afternoon/evening; or returning to the [facility] first thing in the morning for labs and a full evaluation.  Patient refuses consideration of the emergency department this evening; and prefers to return in the morning for further evaluation.  Patient was advised to go directly to the emergency department overnight.  If he develops any worsening symptoms whatsoever.

## 2015-10-04 NOTE — Patient Instructions (Signed)

## 2015-10-04 NOTE — Assessment & Plan Note (Signed)
Patient has had very poor oral intake recently; appears dehydrated today.  Sodium was increased to 150, and creatinine was up to 1.7 today.  Reviewed all findings with Dr.Kale; and he suggested that patient be given half normal saline IV fluid rehydration.  Patient will receive the IV fluid rehydration over 3 hours.  [name].  Since patient does have a history of CHF and a low ejection fraction of only 25%.  Patient may also require additional IV fluid rehydration week prior to initiation of his radiation treatments.

## 2015-10-04 NOTE — Progress Notes (Signed)
SYMPTOM MANAGEMENT CLINIC    Chief Complaint: Weight loss and dehydration  HPI:  [name] 82 y.o. male diagnosed with esophageal cancer.  Scheduled to initiate radiation treatments next week.   Patient states he has lost approximately 26 pounds total since he was diagnosed with cancer.  He states that he has had increasing difficulty swallowing either liquids or solid foods.  He states that all oral intake get stuck in his throat and he has to gag to clear it.  He states that he has been losing approximately 1 pound per day.  Pt met with the nutritionist yesterday; and she also recommended a feeding tube as well.   Reviewed all with Dr. Johny Drilling then all was reviewed thoroughly with both patient and his son regarding the need for probable feeding tube placement to allow for proper hydration and nutrition during his radiation treatments.  Patient continues to take Plavix on a daily basis as prescribed. Due to patient's multiple comorbiditie- Dr. Irene Limbo .  Will discuss in further detail regarding patient's nutritional options on Monday, 10/07/2015.   No history exists.    Review of Systems  Constitutional: Positive for weight loss and malaise/fatigue.  HENT:       Difficulty swallowing any oral intake secondary to esophageal cancer dx.   Cardiovascular:       Chronic O2  All other systems reviewed and are negative.   Past Medical History  Diagnosis Date  . Pulmonary nodule     s/p wedge resction, inflammatory, +MAC  . Emphysema   . Cardiomyopathy     EF 25% by echo 2009  . DVT (deep venous thrombosis) ([place])   . CAD (coronary artery disease)     Dr Einar Gip every 6 months  . Hypertension   . Peripheral vascular disease ([place])   . Myocardial infarction ([place]) 1986  . Shortness of breath   . COPD (chronic obstructive pulmonary disease) ([place])     on O2 constantly  . Keeps losing balance   . Anemia   . Vitamin B 12 deficiency   . Carotid artery occlusion   . History of  oxygen administration     Oxygen 3 l/m at rest / 4 l/m when active. 24/7  . Esophageal cancer [facility])     'causes chest discomfort, liquids and solids"    Past Surgical History  Procedure Laterality Date  . Carotid endarterectomy  2009    triple bypass  . Lung surgery  2008    tumor"infection"  . Coronary artery bypass graft  2009  . Vascular surgery    . Carotid-vertebral bypass graft  08/20/2011    Procedure: BYPASS GRAFT CAROTID-VERTEBRAL;  Surgeon: Serafina Mitchell, MD;  Location: [facility] OR;  Service: Vascular;  Laterality: Right;  Right CAROTID-VERTEBRAL TRANSPOSITION  . Pr vein bypass graft,aorto-fem-pop    . Arch aortogram N/A 07/21/2011    Procedure: ARCH AORTOGRAM;  Surgeon: Laverda Page, MD;  Location: [facility] CATH LAB;  Service: Cardiovascular;  Laterality: N/A;  . Esophagogastroduodenoscopy (egd) with propofol N/A 09/19/2015    Procedure: ESOPHAGOGASTRODUODENOSCOPY (EGD) WITH PROPOFOL;  Surgeon: Clarene Essex, MD;  [facility] ENDOS[facility];  Service: Endoscopy;  Laterality: N/A;  . Savory dilation N/A 09/19/2015    Procedure: SAVORY DILATION;  Surgeon: Clarene Essex, MD;  [facility] ENDOSCOPY;  Service: Endoscopy;  Laterality: N/A;  needs carm    has COPD  GOLD III ; Chronic respiratory failure with hypoxia ([place]); PULMONARY NODULE; Stricture of artery ([place]); Occlusion and stenosis of  carotid artery without mention of cerebral infarction; Primary esophageal malignancy ([place]); Unintentional weight loss; and Dehydration on his problem list.    is allergic to symbicort and tetanus toxoids.    Medication List       This list is accurate as of: 10/04/15 10:50 AM.  Always use your most recent med list.               albuterol 108 (90 Base) MCG/ACT inhaler  Commonly known as:  PROAIR HFA  Inhale 2 puffs into the lungs every 6 (six) hours as needed. For shortness of breath     albuterol (2.5 MG/3ML) 0.083% nebulizer solution  Commonly known as:  PROVENTIL  Take 3 mLs (2.5 mg total) by  nebulization 4 (four) times daily as needed. For shortness of breath     aspirin 81 MG chewable tablet  Chew 81 mg by mouth daily.     atorvastatin 40 MG tablet  Commonly known as:  LIPITOR  Take 40 mg by mouth daily.     cholecalciferol 1000 units tablet  Commonly known as:  VITAMIN D  Take 1,000 Units by mouth daily.     clopidogrel 75 MG tablet  Commonly known as:  PLAVIX  Take 75 mg by mouth daily.     dexamethasone 4 MG tablet  Commonly known as:  DECADRON  Take 2 tablets (8 mg total) by mouth daily. Start the day after chemotherapy for 2 days.     folic acid 1 MG tablet  Commonly known as:  FOLVITE  Take 1 mg by mouth daily.     lidocaine-prilocaine cream  Commonly known as:  EMLA  Apply to affected area once     losartan 25 MG tablet  Commonly known as:  COZAAR  Take 25 mg by mouth daily.     metoprolol tartrate 25 MG tablet  Commonly known as:  LOPRESSOR  Take 25 mg by mouth daily.     ondansetron 8 MG tablet  Commonly known as:  ZOFRAN  Take 1 tablet (8 mg total) by mouth 2 (two) times daily as needed for refractory nausea / vomiting. Start on day 3 after chemo.     prochlorperazine 10 MG tablet  Commonly known as:  COMPAZINE  Take 1 tablet (10 mg total) by mouth every 6 (six) hours as needed (Nausea or vomiting).     vitamin B-12 1000 MCG tablet  Commonly known as:  CYANOCOBALAMIN  Take 1,000 mcg by mouth daily.         PHYSICAL EXAMINATION  Oncology Vitals 10/04/2015 10/03/2015  Height 166 cm -  Weight 67.767 kg 67.495 kg  Weight (lbs) 149 lbs 6 oz 148 lbs 13 oz  BMI (kg/m2) 24.48 kg/m2 -  Temp 97.6 -  Pulse 103 -  Resp 20 -  SpO2 96 -  BSA (m2) 1.77 m2 -   BP Readings from Last 2 Encounters:  10/04/15 125/51  09/26/15 127/66    Physical Exam  Constitutional: He is oriented to person, place, and time. Vital signs are normal. He appears malnourished and dehydrated. He appears unhealthy. He appears cachectic.  HENT:  Head: Normocephalic  and atraumatic.  Mouth/Throat: Oropharynx is clear and moist.  Eyes: Conjunctivae and EOM are normal. Pupils are equal, round, and reactive to light. Right eye exhibits no discharge. Left eye exhibits no discharge. No scleral icterus.  Neck: Normal range of motion. Neck supple. No JVD present. No tracheal deviation present. No thyromegaly present.  Cardiovascular: Normal  rate, regular rhythm, normal heart sounds and intact distal pulses.   Pulmonary/Chest: No respiratory distress. He has no wheezes. He has no rales. He exhibits no tenderness.  Patient on 3 L nasal cannula as baseline.  Patient has diminished breath sounds bilaterally; but no wheezing, coughing, or acute respiratory distress.  Abdominal: Soft. Bowel sounds are normal. He exhibits no distension and no mass. There is no tenderness. There is no rebound and no guarding.  Musculoskeletal: Normal range of motion. He exhibits no edema or tenderness.  Lymphadenopathy:    He has no cervical adenopathy.  Neurological: He is alert and oriented to person, place, and time.  Uses a walker for ambulation  Skin: Skin is warm and dry. No rash noted. No erythema. There is pallor.  Psychiatric: Affect normal.  Nursing note and vitals reviewed.   LABORATORY DATA:. Appointment on 10/04/2015  Component Date Value Ref Range Status  . WBC 10/04/2015 8.4  4.0 - 10.3 10e3/uL Final  . NEUT# 10/04/2015 5.6  1.5 - 6.5 10e3/uL Final  . HGB 10/04/2015 14.3  13.0 - 17.1 g/dL Final  . HCT 10/04/2015 42.6  38.4 - 49.9 % Final  . Platelets 10/04/2015 235  140 - 400 10e3/uL Final  . MCV 10/04/2015 87.1  79.3 - 98.0 fL Final  . MCH 10/04/2015 29.2  27.2 - 33.4 pg Final  . MCHC 10/04/2015 33.6  32.0 - 36.0 g/dL Final  . RBC 10/04/2015 4.89  4.20 - 5.82 10e6/uL Final  . RDW 10/04/2015 12.7  11.0 - 14.6 % Final  . lymph# 10/04/2015 1.7  0.9 - 3.3 10e3/uL Final  . MONO# 10/04/2015 0.9  0.1 - 0.9 10e3/uL Final  . Eosinophils Absolute 10/04/2015 0.1  0.0 - 0.5  10e3/uL Final  . Basophils Absolute 10/04/2015 0.0  0.0 - 0.1 10e3/uL Final  . NEUT% 10/04/2015 67.1  39.0 - 75.0 % Final  . LYMPH% 10/04/2015 20.8  14.0 - 49.0 % Final  . MONO% 10/04/2015 10.4  0.0 - 14.0 % Final  . EOS% 10/04/2015 1.3  0.0 - 7.0 % Final  . BASO% 10/04/2015 0.4  0.0 - 2.0 % Final  . Sodium 10/04/2015 150* 136 - 145 mEq/L Final  . Potassium 10/04/2015 3.8  3.5 - 5.1 mEq/L Final  . Chloride 10/04/2015 108  98 - 109 mEq/L Final  . CO2 10/04/2015 28  22 - 29 mEq/L Final  . Glucose 10/04/2015 134  70 - 140 mg/dl Final   Glucose reference range is for nonfasting patients. Fasting glucose reference range is 70- 100.  [name] BUN 10/04/2015 39.8* 7.0 - 26.0 mg/dL Final  . Creatinine 10/04/2015 1.7* 0.7 - 1.3 mg/dL Final  . Total Bilirubin 10/04/2015 0.83  0.20 - 1.20 mg/dL Final  . Alkaline Phosphatase 10/04/2015 64  40 - 150 U/L Final  . AST 10/04/2015 18  5 - 34 U/L Final  . ALT 10/04/2015 12  0 - 55 U/L Final  . Total Protein 10/04/2015 7.9  6.4 - 8.3 g/dL Final  . Albumin 10/04/2015 4.2  3.5 - 5.0 g/dL Final  . Calcium 10/04/2015 10.4  8.4 - 10.4 mg/dL Final  . Anion Gap 10/04/2015 14* 3 - 11 mEq/L Final  . EGFR 10/04/2015 36* >90 ml/min/1.73 m2 Final   eGFR is calculated using the CKD-EPI Creatinine Equation (2009)    RADIOGRAPHIC STUDIES: No results found.  ASSESSMENT/PLAN:    Unintentional weight loss Patient states he has lost approximately 26 pounds total since he was diagnosed with cancer.  He states that he has had increasing difficulty swallowing either liquids or solid foods.  He states that all oral intake get stuck in his throat and he has to gag to clear it.  He states that he has been losing approximately 1 pound per day.  Pt met with the nutritionist yesterday; and she also recommended a feeding tube as well.   Reviewed all with Dr. Johny Drilling then all was reviewed thoroughly with both patient and his son regarding the need for probable feeding tube placement  to allow for proper hydration and nutrition during his radiation treatments.  Patient continues to take Plavix on a daily basis as prescribed. Due to patient's multiple comorbiditie- Dr. Irene Limbo .  Will discuss in further detail regarding patient's nutritional options on Monday, 10/07/2015.  Primary esophageal malignancy [facility]) Patient has recently been diagnosed with esophageal cancer.  He is scheduled to meet Dr. Servando Snare.  Gen. surgeon to see if he is a surgical candidate on 10/08/2015.  He is scheduled for his initial visit with radiation oncology on 10/09/2015; with the tentative plan to start radiation treatments on 10/10/2015.  Patient was initially scheduled for a follow-up visit with Dr. Irene Limbo on 10/09/2015; but this appointment will be moved up to Monday, 10/07/2015 so Dr. Irene Limbo can discuss the option of a feeding tube with the patient.  Dehydration Patient has had very poor oral intake recently; appears dehydrated today.  Sodium was increased to 150, and creatinine was up to 1.7 today.  Reviewed all findings with Dr.Kale; and he suggested that patient be given half normal saline IV fluid rehydration.  Patient will receive the IV fluid rehydration over 3 hours.  [name].  Since patient does have a history of CHF and a low ejection fraction of only 25%.  Patient may also require additional IV fluid rehydration week prior to initiation of his radiation treatments.   Patient stated understanding of all instructions; and was in agreement with this plan of care. The patient knows to call the clinic with any problems, questions or concerns.   Total time spent with patient was 25 minutes;  with greater than 75 percent of that time spent in face to face counseling regarding patient's symptoms,  and coordination of care and follow up.  Disclaimer:This dictation was prepared with Dragon/digital dictation along with [employer]. Any transcriptional errors that result from this process are  unintentional.  Roberto Second, NP 10/04/2015

## 2015-10-04 NOTE — Assessment & Plan Note (Signed)
Patient states he has lost approximately 26 pounds total since he was diagnosed with cancer.  He states that he has had increasing difficulty swallowing either liquids or solid foods.  He states that all oral intake get stuck in his throat and he has to gag to clear it.  He states that he has been losing approximately 1 pound per day.  Pt met with the nutritionist yesterday; and she also recommended a feeding tube as well.   Reviewed all with Dr. Johny Drilling then all was reviewed thoroughly with both patient and his son regarding the need for probable feeding tube placement to allow for proper hydration and nutrition during his radiation treatments.  Patient continues to take Plavix on a daily basis as prescribed. Due to patient's multiple comorbiditie- Dr. Irene Limbo .  Will discuss in further detail regarding patient's nutritional options on Monday, 10/07/2015.

## 2015-10-04 NOTE — Assessment & Plan Note (Signed)
Patient has had decreased oral intake and continued weight loss.  He states he is losing approximate 1 pound a day; with his total weight loss approximately 26 pounds since his cancer diagnosis.  He does appear dehydrated today; and will return to the cancer Center tomorrow to receive IV fluid rehydration.

## 2015-10-07 ENCOUNTER — Telehealth: Payer: Self-pay | Admitting: Hematology

## 2015-10-07 DIAGNOSIS — Z510 Encounter for antineoplastic radiation therapy: Secondary | ICD-10-CM | POA: Diagnosis not present

## 2015-10-07 NOTE — Telephone Encounter (Signed)
VG covering AP - 7/12 f/u moved to AM. Spoke with patient re new time for 7/12 f/u at 10:45 am. Not able to move f/u to 7/10.

## 2015-10-08 ENCOUNTER — Encounter: Payer: Self-pay | Admitting: Cardiothoracic Surgery

## 2015-10-08 ENCOUNTER — Institutional Professional Consult (permissible substitution) (INDEPENDENT_AMBULATORY_CARE_PROVIDER_SITE_OTHER): Payer: Medicare Other | Admitting: Cardiothoracic Surgery

## 2015-10-08 VITALS — BP 100/61 | HR 99 | Resp 20 | Ht 65.5 in | Wt 149.0 lb

## 2015-10-08 DIAGNOSIS — C159 Malignant neoplasm of esophagus, unspecified: Secondary | ICD-10-CM

## 2015-10-08 NOTE — Progress Notes (Signed)
[address]       [place] [address]             [phone]                    Saman Worley [place] Medical Record [id number] Date of Birth: 10-01-1931  Referring: Kyung Rudd, MD Primary Care: [name], MD  Chief Complaint:    Chief Complaint  Patient presents with  . Esophageal Cancer    Surgical eval, BARIUM SWALLOW 09/12/15,  Upper GI endoscopy and biopsy 09/19/15, PET Scan 09/27/15, ECHO 10/03/15,    History of Present Illness:    [name] 82 y.o. male is seen in the office  today for esophageal cancer. .    He has multiple medical comorbidities including coronary disease status post MI and CABG with ischemic cardiomyopathy ejection fraction 25% in 2009, COPD Gold III on chronic O2 at 3 L/min, history of pulmonary nodule wedge resection in 2008 which was positive for MAC, history of DVT in 2009 status post cardiac bypass, hypertension, peripheral vascular disease, B12 deficiency.  He notes that he developed increasing dysphagia to solids first noticed about 2-2.5 months ago. This was associated with about a  30 lbs pound weight loss. Patient notes that he has dysphagia to liquids as well now.  Current Activity/ Functional Status:  Patient is not independent with mobility/ambulation, transfers, ADL's, IADL's. Patient on home oxygen  Zubrod Score: At the time of surgery this patient's most appropriate activity status/level should be described as: []     0    Normal activity, no symptoms []     1    Restricted in physical strenuous activity but ambulatory, able to do out light work [x]     2    Ambulatory and capable of self care, unable to do work activities, up and about               >50 % of waking hours                              []     3    Only limited self care, in bed greater than 50% of waking hours []     4    Completely disabled, no self care, confined to bed or chair []     5    Moribund   Past Medical History  Diagnosis Date  .  Pulmonary nodule     s/p wedge resction, inflammatory, +MAC  . Emphysema   . Cardiomyopathy     EF 25% by echo 2009  . DVT (deep venous thrombosis) ([place])   . CAD (coronary artery disease)     Dr Einar Gip every 6 months  . Hypertension   . Peripheral vascular disease ([place])   . Myocardial infarction ([place]) 1986  . Shortness of breath   . COPD (chronic obstructive pulmonary disease) ([place])     on O2 constantly  . Keeps losing balance   . Anemia   . Vitamin B 12 deficiency   . Carotid artery occlusion   . History of oxygen administration     Oxygen 3 l/m at rest / 4 l/m when active. 24/7  . Esophageal cancer [facility])     'causes chest discomfort, liquids and solids"    Past Surgical History  Procedure Laterality Date  . Carotid endarterectomy  2009    triple bypass  . Lung  surgery  2008    tumor"infection"  . Coronary artery bypass graft  2009  . Vascular surgery    . Carotid-vertebral bypass graft  08/20/2011    Procedure: BYPASS GRAFT CAROTID-VERTEBRAL;  Surgeon: Serafina Mitchell, MD;  Location[facility] OR;  Service: Vascular;  Laterality: Right;  Right CAROTID-VERTEBRAL TRANSPOSITION  . Pr vein bypass graft,aorto-fem-pop    . Arch aortogram N/A 07/21/2011    Procedure: ARCH AORTOGRAM;  Surgeon: Laverda Page, MD;  Location: [facility] CATH LAB;  Service: Cardiovascular;  Laterality: N/A;  . Esophagogastroduodenoscopy (egd) with propofol N/A 09/19/2015    Procedure: ESOPHAGOGASTRODUODENOSCOPY (EGD) WITH PROPOFOL;  Surgeon: Clarene Essex, MD;  Location: [facility] ENDOSCOPY;  Service: Endoscopy;  Laterality: N/A;  . Savory dilation N/A 09/19/2015    Procedure: SAVORY DILATION;  Surgeon: Clarene Essex, MD;  Location: [facility] ENDOSCOPY;  Service: Endoscopy;  Laterality: N/A;  needs carm    Family History  Problem Relation Age of Onset  . Heart disease Father   . Diabetes Sister   . Anesthesia problems Neg Hx     Social History   Social History  . Marital Status: Divorced    Spouse Name: N/A  . Number of  Children: N/A  . Years of Education: N/A   Occupational History  . Retired     Charity fundraiser   Social History Main Topics  . Smoking status: Former Smoker -- 2.00 packs/day for 56 years    Types: Cigarettes    Quit date: 03/30/2006  . Smokeless tobacco: Never Used  . Alcohol Use: 2.4 oz/week    4 Standard drinks or equivalent per week  . Drug Use: No  . Sexual Activity: Not on file  Patient is originally from [place] but speaks good English  Other Topics Concern  . Not on file   Social History Narrative    History  Smoking status  . Former Smoker -- 2.00 packs/day for 56 years  . Types: Cigarettes  . Quit date: 03/30/2006  Smokeless tobacco  . Never Used    History  Alcohol Use  . 2.4 oz/week  . 4 Standard drinks or equivalent per week     Allergies  Allergen Reactions  . Symbicort [Budesonide-Formoterol Fumarate] Nausea And Vomiting  . Tetanus Toxoids Other (See Comments)    In 1958 he got the tetanus shot and "blacked out"    Current Outpatient Prescriptions  Medication Sig Dispense Refill  . albuterol (PROAIR HFA) 108 (90 BASE) MCG/ACT inhaler Inhale 2 puffs into the lungs every 6 (six) hours as needed. For shortness of breath 1 Inhaler 2  . albuterol (PROVENTIL) (2.5 MG/3ML) 0.083% nebulizer solution Take 3 mLs (2.5 mg total) by nebulization 4 (four) times daily as needed. For shortness of breath 75 mL 11  . aspirin 81 MG chewable tablet Chew 81 mg by mouth daily.    [name] atorvastatin (LIPITOR) 40 MG tablet Take 40 mg by mouth daily.    . cholecalciferol (VITAMIN D) 1000 units tablet Take 1,000 Units by mouth daily.    . clopidogrel (PLAVIX) 75 MG tablet Take 75 mg by mouth daily.     [name] dexamethasone (DECADRON) 4 MG tablet Take 2 tablets (8 mg total) by mouth daily. Start the day after chemotherapy for 2 days. 30 tablet 1  . folic acid (FOLVITE) 1 MG tablet Take 1 mg by mouth daily.     [name] losartan (COZAAR) 25 MG tablet Take 25 mg by mouth daily.    . metoprolol  tartrate (LOPRESSOR)  25 MG tablet Take 25 mg by mouth daily.    . vitamin B-12 (CYANOCOBALAMIN) 1000 MCG tablet Take 1,000 mcg by mouth daily.    [name] lidocaine-prilocaine (EMLA) cream Apply to affected area once (Patient not taking: Reported on 10/08/2015) 30 g 3  . ondansetron (ZOFRAN) 8 MG tablet Take 1 tablet (8 mg total) by mouth 2 (two) times daily as needed for refractory nausea / vomiting. Start on day 3 after chemo. (Patient not taking: Reported on 10/08/2015) 30 tablet 1  . prochlorperazine (COMPAZINE) 10 MG tablet Take 1 tablet (10 mg total) by mouth every 6 (six) hours as needed (Nausea or vomiting). (Patient not taking: Reported on 10/08/2015) 30 tablet 1   No current facility-administered medications for this visit.      Review of Systems:     Cardiac Review of Systems: Y or N  Chest Pain [ y   ]  Resting SOB Blue.Reese   ] Exertional SOB  [ y ]  Orthopnea [ n ]   Pedal Edema [n   ]    Palpitations [n ] Syncope  [  n]   Presyncope [  n ]  General Review of Systems: [Y] = yes [  ]=no Constitional: recent weight change [ y 30 lbs ];  Wt loss over the last 3 months [   ] anorexia [  ]; fatigue [ y ]; nausea [  ]; night sweats [  ]; fever [  ]; or chills [  ];          Dental: poor dentition[  ]; Last Dentist visit:   Eye : blurred vision [  ]; diplopia [   ]; vision changes [  ];  Amaurosis fugax[  ];y Resp: cough [ y ];  wheezing[y  ];  hemoptysis[  ]; shortness of breath[y  ]; paroxysmal nocturnal dyspnea[  ]; dyspnea on exertion[  y]; or orthopnea[  ];  GI:  gallstones[  ], vomiting[  ];  dysphagia[  ]; melena[  ];  hematochezia [  ]; heartburn[ y ];   Hx of  Colonoscopy[  ]; GU: kidney stones [  ]; hematuria[  ];   dysuria [  ];  nocturia[  ];  history of     obstruction [  ]; urinary frequency [  ]             Skin: rash, swelling[  ];, hair loss[  ];  peripheral edema[  ];  or itching[  ]; Musculosketetal: myalgias[  ];  joint swelling[  ];  joint erythema[  ];  joint pain[  ];  back pain[   ];  Heme/Lymph: bruising[  ];  bleeding[  ];  anemia[  ];  Neuro: TIA[  ];  headaches[  ];  stroke[  ];  vertigo[  ];  seizures[  ];   paresthesias[  ];  difficulty walking[  ];  Psych:depression[  ]; anxiety[  ];  Endocrine: diabetes[  ];  thyroid dysfunction[  ];  Immunizations: Flu up to date [  ]; Pneumococcal up to date [  ];  Other:  Physical Exam: BP 100/61 mmHg  Pulse 99  Resp 20  Ht 5' 5.5" (1.664 m)  Wt 149 lb (67.586 kg)  BMI 24.41 kg/m2  SpO2   PHYSICAL EXAMINATION: on o2 and comes in with walker General appearance: alert, cooperative, appears older than stated age, cachectic and no distress Head: Normocephalic, without obvious abnormality, atraumatic Neck: no adenopathy, no carotid bruit, no  JVD, supple, symmetrical, trachea midline and thyroid not enlarged, symmetric, no tenderness/mass/nodules Lymph nodes: Cervical, supraclavicular, and axillary nodes normal. Resp: diminished breath sounds bibasilar Back: symmetric, no curvature. ROM normal. No CVA tenderness. Cardio: regular rate and rhythm, S1, S2 normal, no murmur, click, rub or gallop GI: soft, non-tender; bowel sounds normal; no masses,  no organomegaly Extremities: extremities normal, atraumatic, no cyanosis or edema Neurologic: Grossly normal  Diagnostic Studies & Laboratory data:     Recent Radiology Findings:   Dg Esophagus  09/12/2015  CLINICAL DATA:  Dysphagia, loss of 4 8 EXAM: ESOPHOGRAM/BARIUM SWALLOW TECHNIQUE: Single contrast examination was performed using  thin barium. FLUOROSCOPY TIME:  Radiation Exposure Index (as provided by the fluoroscopic device): 47 deciGy per square cm If the device does not provide the exposure index: Fluoroscopy Time:  1 minutes 12 seconds Number of Acquired Images: COMPARISON:  None. FINDINGS: The study was begun in the erect position with AP and lateral views of the cervical esophagus obtained. The swallowing mechanism is unremarkable. The does appear to be a small right  lateral lower ring conceal present. No aspiration or penetration is seen. There are mild tertiary contractions in the mid and distal esophagus. However, a few cm below the level of the carina there is a constricting lesion of the mid distal esophagus with mucosal irregularity consistent with esophageal carcinoma over several cm length. No obstruction to the passage of barium is seen. However in view of this finding a barium pill was not given. On delayed images there does appear to be a paraesophageal hernia present. No gastroesophageal reflux could be elicited. IMPRESSION: 1. Constricting lesion of the mid distal esophagus with mucosal irregularity most consistent with esophageal carcinoma over several cm length. This lesion does not cause obstruction to the passage of barium. A barium pill however was not given. 2. Paraesophageal hernia. 3. Mild tertiary contractions. Electronically Signed   By: Ivar Drape M.D.   On: 09/12/2015 09:17   Nm Pet Image Initial (pi) Skull Base To Thigh  09/27/2015  CLINICAL DATA:  Initial Treatment strategy for esophageal mass. EXAM: NUCLEAR MEDICINE PET SKULL BASE TO THIGH TECHNIQUE: 7.8 mCi F-18 FDG was injected intravenously. Full-ring PET imaging was performed from the skull base to thigh after the radiotracer. CT data was obtained and used for attenuation correction and anatomic localization. FASTING BLOOD GLUCOSE:  Value: 109 mg/dl COMPARISON:  01/25/2007 FINDINGS: NECK Accentuated but relatively symmetric lymphoid activity at the tongue base and tonsillar pillars, maximum SUV 8.7. Accentuated activity along the glottis is reasonably symmetric and probably physiologic. There is some high activity in the cervical esophagus which is thought to likely be physiologic. CHEST Dilated esophagus with frothy material extending down to a distal esophageal mass which has maximum standard uptake value of 30.7 and which extends over approximately 6 cm length of the esophagus. In addition,  there is a second focus of accentuated esophageal activity in the distal esophagus near the gastroesophageal junction, measuring about 2.5 cm in length, with maximum standard uptake value 14.9. Coronary, aortic arch, and branch vessel atherosclerotic vascular disease. Prior CABG. Hypermetabolic right hilar lymph node with maximum SUV 5.0. An adjacent tiny right lower paratracheal node adjacent to the right mainstem bronchus has maximum SUV of 4.7. Subcarinal node measures 0.8 cm in short axis on image 76/4 and has a maximum SUV a 4.5. Severe emphysema. There is some scarring in the left lung, likely postoperative. ABDOMEN/PELVIS Gastrohepatic ligament lymph node measuring 1.5 cm in short axis on  image 108/4 with maximum standard uptake value 9.9. I do not observe a liver mass. Right adrenal adenoma measures 2.6 by 2.1 cm, density 6 Hounsfield units, minimal metabolic activity. Aortoiliac atherosclerotic vascular disease. Cholelithiasis noted with layering gallstones in the gallbladder. Mildly prominent prostate gland. Postoperative findings along the left groin. SKELETON No focal hypermetabolic activity to suggest skeletal metastasis. IMPRESSION: 1. 6 cm long hypermetabolic region in the distal esophagus compatible with esophageal cancer (max SUV 30.7). There is a second 2.5 cm region of hypermetabolic activity in the distal most esophagus just proximal to the gastroesophageal junction which could represent a second focus of cancer (max SUV 14.9). The there is enlarged gastrohepatic ligament lymph node which is highly hypermetabolic (max SUV 9.9), and faint hypermetabolic activity in several small mediastinal lymph nodes including a right hilar node, a small lymph node anterior to the right mainstem bronchus, and a subcarinal lymph node. 2. Other imaging findings of potential clinical significance: Coronary, aortic arch, and branch vessel atherosclerotic vascular disease. Prior CABG Aortoiliac atherosclerotic  vascular disease. Severe emphysema. Scarring in the left upper lung, postoperative. Right adrenal adenoma. Cholelithiasis. Mildly prominent prostate gland. Electronically Signed   By: Van Clines M.D.   On: 09/27/2015 09:42     I have independently reviewed the above radiologic studies.   PATH: Diagnosis Esophagus, biopsy, distal - POSITIVE FOR SQUAMOUS CELL CARCINOMA. - SEE COMMENT. Microscopic Comment Sections demonstrate squamous cell carcinoma. There are areas suspicious for invasion. Please correlate closely with clinical and endoscopic impression. Dr. Lyndon Code has seen this case in consultation with agreement. The findings are called to Dr. Watt Climes on 09/20/2015. (RH:ecj 09/20/2015) Willeen Niece MD Pathologist, Electronic Signature (Case signed 09/20/2015 Recent Lab Findings: Lab Results  Component Value Date   WBC 8.4 10/04/2015   HGB 14.3 10/04/2015   HCT 42.6 10/04/2015   PLT 235 10/04/2015   GLUCOSE 134 10/04/2015   CHOL  08/10/2007    170        ATP III CLASSIFICATION:  <200     mg/dL   Desirable  200-239  mg/dL   Borderline High  >=240    mg/dL   High   TRIG 122 08/10/2007   HDL 58 08/10/2007   LDLCALC  08/10/2007    88        Total Cholesterol/HDL:CHD Risk Coronary Heart Disease Risk Table                     Men   Women  1/2 Average Risk   3.4   3.3   ALT 12 10/04/2015   AST 18 10/04/2015   NA 150* 10/04/2015   K 3.8 10/04/2015   CL 107 08/21/2011   CREATININE 1.7* 10/04/2015   BUN 39.8* 10/04/2015   CO2 28 10/04/2015   INR 1.15 08/18/2011   HGBA1C  08/15/2007    5.0 (NOTE)   The ADA recommends the following therapeutic goals for glycemic   control related to Hgb A1C measurement:   Goal of Therapy:   < 7.0% Hgb A1C   Action Suggested:  > 8.0% Hgb A1C   Ref:  Diabetes Care, 22, Suppl. 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 With the patients  advanced disease including gastrohepatic nodes , more medical condition I do not think surgical resection offers any survival advantage to the patient. Consider placement of G tube for  enteral feeding until result of treatment allows swallowing.       I  spent 30 minutes counseling the patient face to face and 50% or more the  time was spent in counseling and coordination of care. The total time spent in the appointment was 40 minutes.  Grace Isaac MD      [address] [place] [address] Office [phone]   Beeper [phone]  10/08/2015 5:41 PM

## 2015-10-09 ENCOUNTER — Ambulatory Visit (HOSPITAL_BASED_OUTPATIENT_CLINIC_OR_DEPARTMENT_OTHER): Payer: Medicare Other | Admitting: Hematology

## 2015-10-09 ENCOUNTER — Other Ambulatory Visit: Payer: Self-pay | Admitting: *Deleted

## 2015-10-09 ENCOUNTER — Encounter: Payer: Self-pay | Admitting: Hematology

## 2015-10-09 ENCOUNTER — Ambulatory Visit
Admission: RE | Admit: 2015-10-09 | Discharge: 2015-10-09 | Disposition: A | Discharge status: Home / Self Care | Payer: Medicare Other | Source: Ambulatory Visit | Attending: Radiation Oncology | Admitting: Radiation Oncology

## 2015-10-09 ENCOUNTER — Ambulatory Visit (HOSPITAL_BASED_OUTPATIENT_CLINIC_OR_DEPARTMENT_OTHER): Payer: Medicare Other

## 2015-10-09 VITALS — BP 117/49 | HR 100 | Temp 98.0°F | Resp 20 | Wt 149.9 lb

## 2015-10-09 DIAGNOSIS — E860 Dehydration: Secondary | ICD-10-CM

## 2015-10-09 DIAGNOSIS — N179 Acute kidney failure, unspecified: Secondary | ICD-10-CM | POA: Diagnosis not present

## 2015-10-09 DIAGNOSIS — C159 Malignant neoplasm of esophagus, unspecified: Secondary | ICD-10-CM

## 2015-10-09 DIAGNOSIS — Z510 Encounter for antineoplastic radiation therapy: Secondary | ICD-10-CM | POA: Diagnosis not present

## 2015-10-09 DIAGNOSIS — C155 Malignant neoplasm of lower third of esophagus: Secondary | ICD-10-CM

## 2015-10-09 DIAGNOSIS — E43 Unspecified severe protein-calorie malnutrition: Secondary | ICD-10-CM | POA: Insufficient documentation

## 2015-10-09 LAB — CBC & DIFF AND RETIC
BASO%: 0.3 % (ref 0.0–2.0)
BASOS ABS: 0 10*3/uL (ref 0.0–0.1)
EOS ABS: 0.1 10*3/uL (ref 0.0–0.5)
EOS%: 1.5 % (ref 0.0–7.0)
HEMATOCRIT: 42.1 % (ref 38.4–49.9)
HEMOGLOBIN: 14.1 g/dL (ref 13.0–17.1)
Immature Retic Fract: 2.5 % — ABNORMAL LOW (ref 3.00–10.60)
LYMPH%: 26.1 % (ref 14.0–49.0)
MCH: 28.9 pg (ref 27.2–33.4)
MCHC: 33.5 g/dL (ref 32.0–36.0)
MCV: 86.3 fL (ref 79.3–98.0)
MONO#: 0.9 10*3/uL (ref 0.1–0.9)
MONO%: 11.1 % (ref 0.0–14.0)
NEUT#: 4.8 10*3/uL (ref 1.5–6.5)
NEUT%: 61 % (ref 39.0–75.0)
PLATELETS: 250 10*3/uL (ref 140–400)
RBC: 4.88 10*6/uL (ref 4.20–5.82)
RDW: 12.7 % (ref 11.0–14.6)
Retic %: 0.66 % — ABNORMAL LOW (ref 0.80–1.80)
Retic Ct Abs: 32.21 10*3/uL — ABNORMAL LOW (ref 34.80–93.90)
WBC: 7.9 10*3/uL (ref 4.0–10.3)
lymph#: 2.1 10*3/uL (ref 0.9–3.3)

## 2015-10-09 LAB — COMPREHENSIVE METABOLIC PANEL
ALBUMIN: 4 g/dL (ref 3.5–5.0)
ALK PHOS: 63 U/L (ref 40–150)
ALT: 10 U/L (ref 0–55)
ANION GAP: 12 meq/L — AB (ref 3–11)
AST: 15 U/L (ref 5–34)
BUN: 25.9 mg/dL (ref 7.0–26.0)
CALCIUM: 10.4 mg/dL (ref 8.4–10.4)
CHLORIDE: 106 meq/L (ref 98–109)
CO2: 29 mEq/L (ref 22–29)
Creatinine: 1.9 mg/dL — ABNORMAL HIGH (ref 0.7–1.3)
EGFR: 33 mL/min/{1.73_m2} — AB (ref >90)
Glucose: 110 mg/dl (ref 70–140)
POTASSIUM: 4.1 meq/L (ref 3.5–5.1)
Sodium: 147 mEq/L — ABNORMAL HIGH (ref 136–145)
Total Bilirubin: 0.86 mg/dL (ref 0.20–1.20)
Total Protein: 7.8 g/dL (ref 6.4–8.3)

## 2015-10-09 LAB — MAGNESIUM: MAGNESIUM: 1.9 mg/dL (ref 1.5–2.5)

## 2015-10-09 MED ORDER — SONAFINE EX EMUL
1.0000 "application " | Freq: Two times a day (BID) | CUTANEOUS | Status: DC
  Administered 2015-10-09: 1 via TOPICAL

## 2015-10-09 MED ORDER — SODIUM CHLORIDE 0.9 % IV SOLN
INTRAVENOUS | Status: AC
  Administered 2015-10-09: 14:00:00 via INTRAVENOUS

## 2015-10-09 NOTE — Progress Notes (Addendum)
Pt education done, radiation therapy and you book given,  My business card, sonafine cream, discussed ways to manage side effects, skin irritation, difficulty, painful  swallowing, throat changes, fatigue, weight loss, possible need for IVF"S, swelling of larynx,loss chest hair, dehydration, sees MD weekly and prn, use sonafine cream after rad tx and bedtime, verbal understanding, teach back given, may need to eat 5-6 smaller meals and snacks in between, talked as he was walking to lobby, will go over booklet and side effects agion tomorrow \\1 :11 PM

## 2015-10-09 NOTE — Progress Notes (Signed)
Per Dr. Irene Limbo, no chemo today, just IVF hydration.

## 2015-10-09 NOTE — Progress Notes (Signed)
Pt and VS stable at time of discharge.  

## 2015-10-09 NOTE — Patient Instructions (Signed)

## 2015-10-10 ENCOUNTER — Other Ambulatory Visit: Payer: Medicare Other

## 2015-10-10 ENCOUNTER — Ambulatory Visit
Admission: RE | Admit: 2015-10-10 | Discharge: 2015-10-10 | Disposition: A | Discharge status: Home / Self Care | Payer: Medicare Other | Source: Ambulatory Visit | Attending: Radiation Oncology | Admitting: Radiation Oncology

## 2015-10-10 ENCOUNTER — Other Ambulatory Visit: Payer: Self-pay | Admitting: Radiology

## 2015-10-10 ENCOUNTER — Encounter: Payer: Self-pay | Admitting: *Deleted

## 2015-10-10 DIAGNOSIS — Z510 Encounter for antineoplastic radiation therapy: Secondary | ICD-10-CM | POA: Diagnosis not present

## 2015-10-11 ENCOUNTER — Ambulatory Visit
Admission: RE | Admit: 2015-10-11 | Discharge: 2015-10-11 | Disposition: A | Discharge status: Home / Self Care | Payer: Medicare Other | Source: Ambulatory Visit | Attending: Radiation Oncology | Admitting: Radiation Oncology

## 2015-10-11 ENCOUNTER — Ambulatory Visit (HOSPITAL_COMMUNITY)
Admission: RE | Admit: 2015-10-11 | Discharge: 2015-10-11 | Disposition: A | Discharge status: Home / Self Care | Payer: Medicare Other | Source: Ambulatory Visit | Attending: Hematology | Admitting: Hematology

## 2015-10-11 ENCOUNTER — Other Ambulatory Visit: Payer: Self-pay | Admitting: Radiology

## 2015-10-11 ENCOUNTER — Encounter: Payer: Self-pay | Admitting: Radiation Oncology

## 2015-10-11 VITALS — BP 118/77 | HR 91 | Temp 98.2°F | Resp 20 | Wt 149.3 lb

## 2015-10-11 DIAGNOSIS — I255 Ischemic cardiomyopathy: Secondary | ICD-10-CM | POA: Insufficient documentation

## 2015-10-11 DIAGNOSIS — I340 Nonrheumatic mitral (valve) insufficiency: Secondary | ICD-10-CM | POA: Insufficient documentation

## 2015-10-11 DIAGNOSIS — I119 Hypertensive heart disease without heart failure: Secondary | ICD-10-CM | POA: Insufficient documentation

## 2015-10-11 DIAGNOSIS — C159 Malignant neoplasm of esophagus, unspecified: Secondary | ICD-10-CM | POA: Diagnosis not present

## 2015-10-11 DIAGNOSIS — I2510 Atherosclerotic heart disease of native coronary artery without angina pectoris: Secondary | ICD-10-CM | POA: Diagnosis not present

## 2015-10-11 DIAGNOSIS — I358 Other nonrheumatic aortic valve disorders: Secondary | ICD-10-CM | POA: Diagnosis not present

## 2015-10-11 DIAGNOSIS — I739 Peripheral vascular disease, unspecified: Secondary | ICD-10-CM | POA: Diagnosis not present

## 2015-10-11 DIAGNOSIS — C155 Malignant neoplasm of lower third of esophagus: Secondary | ICD-10-CM

## 2015-10-11 DIAGNOSIS — J449 Chronic obstructive pulmonary disease, unspecified: Secondary | ICD-10-CM | POA: Insufficient documentation

## 2015-10-11 DIAGNOSIS — Z09 Encounter for follow-up examination after completed treatment for conditions other than malignant neoplasm: Secondary | ICD-10-CM | POA: Diagnosis present

## 2015-10-11 DIAGNOSIS — I252 Old myocardial infarction: Secondary | ICD-10-CM | POA: Diagnosis not present

## 2015-10-11 DIAGNOSIS — Z951 Presence of aortocoronary bypass graft: Secondary | ICD-10-CM | POA: Insufficient documentation

## 2015-10-11 DIAGNOSIS — I071 Rheumatic tricuspid insufficiency: Secondary | ICD-10-CM | POA: Insufficient documentation

## 2015-10-11 DIAGNOSIS — Z510 Encounter for antineoplastic radiation therapy: Secondary | ICD-10-CM | POA: Diagnosis not present

## 2015-10-11 NOTE — Progress Notes (Signed)
  Echocardiogram 2D Echocardiogram has been performed.  Darlina Sicilian M 10/11/2015, 1:54 PM

## 2015-10-11 NOTE — Progress Notes (Signed)
Weekly rad txs esophagus 3/30 completed, unable to eat food says a few bites and it comes back up, can take pills okay and they sty down, liquids go down but then pain in sternum stated, none at present, oxygen 3 liters n/c,  No nausea,  No chemotherapy on 10/09/15 just IVF'S,  , weak and fatuguedm, very short of breath when walking with his walker  BP 118/77 mmHg  Pulse 91  Temp(Src) 98.2 F (36.8 C) (Oral)  Resp 20  Wt 149 lb 4.8 oz (67.722 kg)  SpO2 97%  Wt Readings from Last 3 Encounters:  10/11/15 149 lb 4.8 oz (67.722 kg)  10/09/15 149 lb 14.4 oz (67.994 kg)  10/08/15 149 lb (67.586 kg)

## 2015-10-12 NOTE — Progress Notes (Signed)
Department of Radiation Oncology  Phone:  [phone] Fax:        [phone]  Weekly Treatment Note    Name: [name] Date: 10/12/2015 MRN: [id number] DOB: 06-Aug-1931   Diagnosis:     ICD-9-CM ICD-10-CM   1. Cancer of lower third of esophagus (HCC) 150.5 C15.5      Current dose: 6 Gy  Current fraction: 3   MEDICATIONS: Current Outpatient Prescriptions  Medication Sig Dispense Refill  . albuterol (PROAIR HFA) 108 (90 BASE) MCG/ACT inhaler Inhale 2 puffs into the lungs every 6 (six) hours as needed. For shortness of breath 1 Inhaler 2  . albuterol (PROVENTIL) (2.5 MG/3ML) 0.083% nebulizer solution Take 3 mLs (2.5 mg total) by nebulization 4 (four) times daily as needed. For shortness of breath 75 mL 11  . cholecalciferol (VITAMIN D) 1000 units tablet Take 1,000 Units by mouth daily.    [name] losartan (COZAAR) 25 MG tablet Take 25 mg by mouth daily.    . metoprolol tartrate (LOPRESSOR) 25 MG tablet Take 25 mg by mouth daily.    . ondansetron (ZOFRAN) 8 MG tablet Take 1 tablet (8 mg total) by mouth 2 (two) times daily as needed for refractory nausea / vomiting. Start on day 3 after chemo. 30 tablet 1  . vitamin B-12 (CYANOCOBALAMIN) 1000 MCG tablet Take 1,000 mcg by mouth daily.    . Wound Dressings (SONAFINE) Apply 1 application topically 2 (two) times daily.    [name] aspirin 81 MG chewable tablet Chew 81 mg by mouth daily. Reported on 10/11/2015    . atorvastatin (LIPITOR) 40 MG tablet Take 40 mg by mouth daily. Reported on 10/11/2015    . clopidogrel (PLAVIX) 75 MG tablet Take 75 mg by mouth daily. Reported on 10/11/2015    . dexamethasone (DECADRON) 4 MG tablet Take 2 tablets (8 mg total) by mouth daily. Start the day after chemotherapy for 2 days. (Patient not taking: Reported on 10/11/2015) 30 tablet 1  . folic acid (FOLVITE) 1 MG tablet Take 1 mg by mouth daily. Reported on 10/11/2015    . lidocaine-prilocaine (EMLA) cream Apply to affected area once (Patient not taking:  Reported on 10/11/2015) 30 g 3  . prochlorperazine (COMPAZINE) 10 MG tablet Take 1 tablet (10 mg total) by mouth every 6 (six) hours as needed (Nausea or vomiting). (Patient not taking: Reported on 10/11/2015) 30 tablet 1   No current facility-administered medications for this encounter.     ALLERGIES: Symbicort and Tetanus toxoids   LABORATORY DATA:  Lab Results  Component Value Date   WBC 7.9 10/09/2015   HGB 14.1 10/09/2015   HCT 42.1 10/09/2015   MCV 86.3 10/09/2015   PLT 250 10/09/2015   Lab Results  Component Value Date   NA 147* 10/09/2015   K 4.1 10/09/2015   CL 107 08/21/2011   CO2 29 10/09/2015   Lab Results  Component Value Date   ALT 10 10/09/2015   AST 15 10/09/2015   ALKPHOS 63 10/09/2015   BILITOT 0.86 10/09/2015     NARRATIVE: [name] was seen today for weekly treatment management. The chart was checked and the patient's films were reviewed.  Weekly rad txs esophagus 3/30 completed, unable to eat food says a few bites and it comes back up, can take pills okay and they sty down, liquids go down but then pain in sternum stated, none at present, oxygen 3 liters n/c,  No nausea,  No chemotherapy on 10/09/15 just IVF'S,  ,  weak and fatuguedm, very short of breath when walking with his walker  BP 118/77 mmHg  Pulse 91  Temp(Src) 98.2 F (36.8 C) (Oral)  Resp 20  Wt 149 lb 4.8 oz (67.722 kg)  SpO2 97%  Wt Readings from Last 3 Encounters:  10/11/15 149 lb 4.8 oz (67.722 kg)  10/09/15 149 lb 14.4 oz (67.994 kg)  10/08/15 149 lb (67.586 kg)    PHYSICAL EXAMINATION: weight is 149 lb 4.8 oz (67.722 kg). His oral temperature is 98.2 F (36.8 C). His blood pressure is 118/77 and his pulse is 91. His respiration is 20 and oxygen saturation is 97%.        ASSESSMENT: The patient is doing satisfactorily with treatment.  PLAN: We will continue with the patient's radiation treatment as planned. The patient continues to have significant difficulty with  swallowing. We discussed the importance of hydration until he has a feeding tube placed next week.

## 2015-10-14 ENCOUNTER — Ambulatory Visit (HOSPITAL_COMMUNITY)
Admission: RE | Admit: 2015-10-14 | Discharge: 2015-10-14 | Disposition: A | Discharge status: Home / Self Care | Payer: Medicare Other | Source: Ambulatory Visit | Attending: Hematology | Admitting: Hematology

## 2015-10-14 ENCOUNTER — Ambulatory Visit
Admission: RE | Admit: 2015-10-14 | Discharge: 2015-10-14 | Disposition: A | Discharge status: Home / Self Care | Payer: Medicare Other | Source: Ambulatory Visit | Attending: Radiation Oncology | Admitting: Radiation Oncology

## 2015-10-14 ENCOUNTER — Encounter (HOSPITAL_COMMUNITY): Payer: Self-pay

## 2015-10-14 ENCOUNTER — Other Ambulatory Visit: Payer: Self-pay | Admitting: Hematology

## 2015-10-14 DIAGNOSIS — C159 Malignant neoplasm of esophagus, unspecified: Secondary | ICD-10-CM | POA: Diagnosis present

## 2015-10-14 DIAGNOSIS — Z8249 Family history of ischemic heart disease and other diseases of the circulatory system: Secondary | ICD-10-CM | POA: Diagnosis not present

## 2015-10-14 DIAGNOSIS — Z86718 Personal history of other venous thrombosis and embolism: Secondary | ICD-10-CM | POA: Insufficient documentation

## 2015-10-14 DIAGNOSIS — I252 Old myocardial infarction: Secondary | ICD-10-CM | POA: Diagnosis not present

## 2015-10-14 DIAGNOSIS — E538 Deficiency of other specified B group vitamins: Secondary | ICD-10-CM | POA: Insufficient documentation

## 2015-10-14 DIAGNOSIS — Z7982 Long term (current) use of aspirin: Secondary | ICD-10-CM | POA: Insufficient documentation

## 2015-10-14 DIAGNOSIS — I6529 Occlusion and stenosis of unspecified carotid artery: Secondary | ICD-10-CM | POA: Insufficient documentation

## 2015-10-14 DIAGNOSIS — Z923 Personal history of irradiation: Secondary | ICD-10-CM | POA: Insufficient documentation

## 2015-10-14 DIAGNOSIS — I10 Essential (primary) hypertension: Secondary | ICD-10-CM | POA: Insufficient documentation

## 2015-10-14 DIAGNOSIS — Z951 Presence of aortocoronary bypass graft: Secondary | ICD-10-CM | POA: Diagnosis not present

## 2015-10-14 DIAGNOSIS — I739 Peripheral vascular disease, unspecified: Secondary | ICD-10-CM | POA: Diagnosis not present

## 2015-10-14 DIAGNOSIS — I2510 Atherosclerotic heart disease of native coronary artery without angina pectoris: Secondary | ICD-10-CM | POA: Diagnosis not present

## 2015-10-14 DIAGNOSIS — Z87891 Personal history of nicotine dependence: Secondary | ICD-10-CM | POA: Insufficient documentation

## 2015-10-14 DIAGNOSIS — R1310 Dysphagia, unspecified: Secondary | ICD-10-CM | POA: Insufficient documentation

## 2015-10-14 DIAGNOSIS — Z7902 Long term (current) use of antithrombotics/antiplatelets: Secondary | ICD-10-CM | POA: Diagnosis not present

## 2015-10-14 DIAGNOSIS — I429 Cardiomyopathy, unspecified: Secondary | ICD-10-CM | POA: Insufficient documentation

## 2015-10-14 DIAGNOSIS — J449 Chronic obstructive pulmonary disease, unspecified: Secondary | ICD-10-CM | POA: Insufficient documentation

## 2015-10-14 DIAGNOSIS — Z9221 Personal history of antineoplastic chemotherapy: Secondary | ICD-10-CM | POA: Insufficient documentation

## 2015-10-14 DIAGNOSIS — D649 Anemia, unspecified: Secondary | ICD-10-CM | POA: Diagnosis not present

## 2015-10-14 LAB — BASIC METABOLIC PANEL
Anion gap: 12 (ref 5–15)
BUN: 18 mg/dL (ref 6–20)
CHLORIDE: 104 mmol/L (ref 101–111)
CO2: 30 mmol/L (ref 22–32)
CREATININE: 1.28 mg/dL — AB (ref 0.61–1.24)
Calcium: 9.9 mg/dL (ref 8.9–10.3)
GFR calc non Af Amer: 50 mL/min — ABNORMAL LOW (ref >60)
GFR, EST AFRICAN AMERICAN: 58 mL/min — AB (ref >60)
Glucose, Bld: 109 mg/dL — ABNORMAL HIGH (ref 65–99)
POTASSIUM: 4.1 mmol/L (ref 3.5–5.1)
SODIUM: 146 mmol/L — AB (ref 135–145)

## 2015-10-14 LAB — PROTIME-INR
INR: 1.31 (ref 0.00–1.49)
Prothrombin Time: 15.9 seconds — ABNORMAL HIGH (ref 11.6–15.2)

## 2015-10-14 LAB — APTT: aPTT: 31 seconds (ref 24–37)

## 2015-10-14 LAB — CBC
HEMATOCRIT: 41.7 % (ref 39.0–52.0)
Hemoglobin: 13.8 g/dL (ref 13.0–17.0)
MCH: 29.3 pg (ref 26.0–34.0)
MCHC: 33.1 g/dL (ref 30.0–36.0)
MCV: 88.5 fL (ref 78.0–100.0)
Platelets: 271 10*3/uL (ref 150–400)
RBC: 4.71 MIL/uL (ref 4.22–5.81)
RDW: 12.5 % (ref 11.5–15.5)
WBC: 9.2 10*3/uL (ref 4.0–10.5)

## 2015-10-14 MED ORDER — SODIUM CHLORIDE 0.9 % IV SOLN
Freq: Once | INTRAVENOUS | Status: AC
  Administered 2015-10-14: 13:00:00 via INTRAVENOUS

## 2015-10-14 MED ORDER — FENTANYL CITRATE (PF) 100 MCG/2ML IJ SOLN
INTRAMUSCULAR | Status: AC | PRN
  Administered 2015-10-14: 50 ug via INTRAVENOUS
  Administered 2015-10-14 (×2): 25 ug via INTRAVENOUS

## 2015-10-14 MED ORDER — SODIUM CHLORIDE 0.9 % IV SOLN: INTRAVENOUS | Status: DC

## 2015-10-14 MED ORDER — GLUCAGON HCL RDNA (DIAGNOSTIC) 1 MG IJ SOLR
INTRAMUSCULAR | Status: AC
  Filled 2015-10-14: qty 1

## 2015-10-14 MED ORDER — CEFAZOLIN SODIUM-DEXTROSE 2-4 GM/100ML-% IV SOLN
2.0000 g | Freq: Once | INTRAVENOUS | Status: DC
  Filled 2015-10-14: qty 100

## 2015-10-14 MED ORDER — CEFAZOLIN SODIUM-DEXTROSE 2-4 GM/100ML-% IV SOLN
2.0000 g | INTRAVENOUS | Status: DC
  Filled 2015-10-14: qty 100

## 2015-10-14 MED ORDER — GLUCAGON HCL RDNA (DIAGNOSTIC) 1 MG IJ SOLR
INTRAMUSCULAR | Status: AC | PRN
  Administered 2015-10-14: 1 mg via INTRAVENOUS

## 2015-10-14 MED ORDER — NALOXONE HCL 0.4 MG/ML IJ SOLN
INTRAMUSCULAR | Status: AC
  Filled 2015-10-14: qty 1

## 2015-10-14 MED ORDER — FENTANYL CITRATE (PF) 100 MCG/2ML IJ SOLN
INTRAMUSCULAR | Status: AC
  Filled 2015-10-14: qty 4

## 2015-10-14 MED ORDER — FLUMAZENIL 0.5 MG/5ML IV SOLN
INTRAVENOUS | Status: AC
  Filled 2015-10-14: qty 5

## 2015-10-14 MED ORDER — MIDAZOLAM HCL 2 MG/2ML IJ SOLN
INTRAMUSCULAR | Status: AC | PRN
  Administered 2015-10-14 (×4): 1 mg via INTRAVENOUS

## 2015-10-14 MED ORDER — MIDAZOLAM HCL 2 MG/2ML IJ SOLN
INTRAMUSCULAR | Status: AC
  Filled 2015-10-14: qty 4

## 2015-10-14 MED ORDER — LIDOCAINE HCL 1 % IJ SOLN
INTRAMUSCULAR | Status: AC
  Filled 2015-10-14: qty 20

## 2015-10-14 MED ORDER — MIDAZOLAM HCL 2 MG/2ML IJ SOLN
INTRAMUSCULAR | Status: AC | PRN
  Administered 2015-10-14 (×2): 1 mg via INTRAVENOUS

## 2015-10-14 MED ORDER — HYDROCODONE-ACETAMINOPHEN 5-325 MG PO TABS: 1.0000 | ORAL_TABLET | ORAL | Status: DC | PRN

## 2015-10-14 MED ORDER — IOPAMIDOL (ISOVUE-300) INJECTION 61%
50.0000 mL | Freq: Once | INTRAVENOUS | Status: AC | PRN
  Administered 2015-10-14: 40 mL

## 2015-10-14 MED ORDER — HEPARIN SOD (PORK) LOCK FLUSH 100 UNIT/ML IV SOLN
INTRAVENOUS | Status: AC
  Filled 2015-10-14: qty 5

## 2015-10-14 MED ORDER — FENTANYL CITRATE (PF) 100 MCG/2ML IJ SOLN
INTRAMUSCULAR | Status: AC | PRN
  Administered 2015-10-14 (×2): 50 ug via INTRAVENOUS

## 2015-10-14 NOTE — Progress Notes (Signed)
Patient is having a port a cath and PEG tube placed today.  Cancel treatment per Gaspar Garbe, RN.

## 2015-10-14 NOTE — Procedures (Signed)
Successful placement of right IJ approach port-a-cath with tip at the superior caval atrial junction. The catheter is ready for immediate use.  Successful fluoroscopic guided insertion of gastrostomy tube.   The gastrostomy tube may be used immediately for medications.   Tube feeds may be initiated in 24 hours as per the primary team.    EBL: Minimal from both procedures.  No immediate post procedural complications.   Ronny Bacon, MD Pager #: [phone]

## 2015-10-14 NOTE — Sedation Documentation (Signed)
O2 probe with intermittent readings despite new probes placed on multiple fingers. MD aware. Will continue to monitor. Roselyn Reef Minh Jasper,RN

## 2015-10-14 NOTE — H&P (Signed)
Chief Complaint: esophageal cancer  Referring Physician:Dr. Sullivan Lone  Supervising Physician: Sandi Mariscal  Patient Status: Out-pt  HPI: [name] is an 82 y.o. male who was recently diagnosed with esophageal cancer.  He was seen by surgery and given his nodal spread, was determined to not be a surgical candidate.  He is having difficulty swallowing and has not eaten solid food in the last 4 weeks.  He is sticking to essentially clear liquids and at times is having trouble getting that down.  He has been seen by oncology as well as radiation oncology.  The plan to proceed with chemo and radiation.  He presents today for placement of a port a cath and a gastrostomy tube.  He otherwise has no complaints.  He does take Plavix, but this has been held since last week.  Past Medical History:  Past Medical History  Diagnosis Date  . Pulmonary nodule     s/p wedge resction, inflammatory, +MAC  . Emphysema   . Cardiomyopathy     EF 25% by echo 2009  . DVT (deep venous thrombosis) ([place])   . CAD (coronary artery disease)     Dr Einar Gip every 6 months  . Hypertension   . Peripheral vascular disease ([place])   . Myocardial infarction ([place]) 1986  . Shortness of breath   . COPD (chronic obstructive pulmonary disease) ([place])     on O2 constantly  . Keeps losing balance   . Anemia   . Vitamin B 12 deficiency   . Carotid artery occlusion   . History of oxygen administration     Oxygen 3 l/m at rest / 4 l/m when active. 24/7  . Esophageal cancer [facility])     'causes chest discomfort, liquids and solids"    Past Surgical History:  Past Surgical History  Procedure Laterality Date  . Carotid endarterectomy  2009    triple bypass  . Lung surgery  2008    tumor"infection"  . Coronary artery bypass graft  2009  . Vascular surgery    . Carotid-vertebral bypass graft  08/20/2011    Procedure: BYPASS GRAFT CAROTID-VERTEBRAL;  Surgeon: Serafina Mitchell, MD;  Location: [facility] OR;  Service: Vascular;   Laterality: Right;  Right CAROTID-VERTEBRAL TRANSPOSITION  . Pr vein bypass graft,aorto-fem-pop    . Arch aortogram N/A 07/21/2011    Procedure: ARCH AORTOGRAM;  Surgeon: Laverda Page, MD;  Location: [facility] CATH LAB;  Service: Cardiovascular;  Laterality: N/A;  . Esophagogastroduodenoscopy (egd) with propofol N/A 09/19/2015    Procedure: ESOPHAGOGASTRODUODENOSCOPY (EGD) WITH PROPOFOL;  Surgeon: Clarene Essex, MD;  Location: [facility] ENDOSCOPY;  Service: Endoscopy;  Laterality: N/A;  . Savory dilation N/A 09/19/2015    Procedure: SAVORY DILATION;  Surgeon: Clarene Essex, MD;  Location: [facility] ENDOSCOPY;  Service: Endoscopy;  Laterality: N/A;  needs carm    Family History:  Family History  Problem Relation Age of Onset  . Heart disease Father   . Diabetes Sister   . Anesthesia problems Neg Hx     Social History:  reports that he quit smoking about 9 years ago. His smoking use included Cigarettes. He has a 112 pack-year smoking history. He has never used smokeless tobacco. He reports that he drinks about 2.4 oz of alcohol per week. He reports that he does not use illicit drugs.  Allergies:  Allergies  Allergen Reactions  . Symbicort [Budesonide-Formoterol Fumarate] Nausea And Vomiting  . Tetanus Toxoids Other (See Comments)    In 1958 he  got the tetanus shot and "blacked out"    Medications:   Medication List    ASK your doctor about these medications        albuterol 108 (90 Base) MCG/ACT inhaler  Commonly known as:  PROAIR HFA  Inhale 2 puffs into the lungs every 6 (six) hours as needed. For shortness of breath     albuterol (2.5 MG/3ML) 0.083% nebulizer solution  Commonly known as:  PROVENTIL  Take 3 mLs (2.5 mg total) by nebulization 4 (four) times daily as needed. For shortness of breath     aspirin 81 MG chewable tablet  Chew 81 mg by mouth daily. Reported on 10/11/2015     atorvastatin 40 MG tablet  Commonly known as:  LIPITOR  Take 40 mg by mouth daily. Reported on 10/11/2015      cholecalciferol 1000 units tablet  Commonly known as:  VITAMIN D  Take 1,000 Units by mouth daily.     clopidogrel 75 MG tablet  Commonly known as:  PLAVIX  Take 75 mg by mouth daily. Reported on 10/11/2015     dexamethasone 4 MG tablet  Commonly known as:  DECADRON  Take 2 tablets (8 mg total) by mouth daily. Start the day after chemotherapy for 2 days.     folic acid 1 MG tablet  Commonly known as:  FOLVITE  Take 1 mg by mouth daily. Reported on 10/11/2015     lidocaine-prilocaine cream  Commonly known as:  EMLA  Apply to affected area once     losartan 25 MG tablet  Commonly known as:  COZAAR  Take 25 mg by mouth daily.     metoprolol tartrate 25 MG tablet  Commonly known as:  LOPRESSOR  Take 25 mg by mouth daily.     ondansetron 8 MG tablet  Commonly known as:  ZOFRAN  Take 1 tablet (8 mg total) by mouth 2 (two) times daily as needed for refractory nausea / vomiting. Start on day 3 after chemo.     prochlorperazine 10 MG tablet  Commonly known as:  COMPAZINE  Take 1 tablet (10 mg total) by mouth every 6 (six) hours as needed (Nausea or vomiting).     SONAFINE  Apply 1 application topically 2 (two) times daily.     vitamin B-12 1000 MCG tablet  Commonly known as:  CYANOCOBALAMIN  Take 1,000 mcg by mouth daily.        Please HPI for pertinent positives, otherwise complete 10 system ROS negative.  Mallampati Score: MD Evaluation Airway: WNL Heart: WNL Abdomen: WNL Chest/ Lungs: WNL Other Pertinent Findings: maintained on 3L [place] at all times! ASA  Classification: 3 Mallampati/Airway Score: One  Physical Exam: Temp (F)   98  98 (36.7)  07/17 1243   Pulse Rate   98  98  07/17 1243   Resp   20  20  07/17 1243   BP   115/62  115/62  07/17 1243   SpO2 (%)   90  90  07/17 1243   Weight (lb)   146  146 lb (66.225 kg)  07/17       General: pleasant, WD, WN, elderly white male who is laying in bed in NAD HEENT: head is normocephalic, atraumatic.   Sclera are noninjected.  PERRL.  Ears and nose without any masses or lesions.  Mouth is pink and moist Heart: regular, rate, and rhythm.  Normal s1,s2. No obvious murmurs, gallops, or rubs noted.  Palpable radial  and pedal pulses bilaterally Lungs: CTAB, no wheezes, rhonchi, or rales noted.  Respiratory effort nonlabored.  He does have nasal cannula in place with 3L running Abd: soft, NT, ND, +BS, no masses, hernias, or organomegaly MS: all 4 extremities are symmetrical with no cyanosis, clubbing, or edema. Psych: A&Ox3 with an appropriate affect.   Labs: Pending  Imaging: No results found.  Assessment/Plan 1. Esophageal cancer -the patient is already having some significant dysphagia.  We are planning on placing a PAC today as well as a g-tube. -his vitals have been reviewed and his labs are currently pending.  The nurses are rechecking his oxygen saturations.  He does wear 3L [place] at home at all times. -Risks and Benefits discussed with the patient including, but not limited to bleeding, infection, pneumothorax, or fibrin sheath development and need for additional procedures. All of the patient's questions were answered, patient is agreeable to proceed. Consent signed and in chart. -Risks and Benefits discussed with the patient including, but not limited to the need for a barium enema during the procedure, bleeding, infection, peritonitis, or damage to adjacent structures. All of the patient's questions were answered, patient is agreeable to proceed. Consent signed and in chart.    Thank you for this interesting consult.  I greatly enjoyed meeting [name] and look forward to participating in their care.  A copy of this report was sent to the requesting provider on this date.  Electronically Signed: Henreitta Cea 10/14/2015, 1:24 PM   I spent a total of  40 Minutes   in face to face in clinical consultation, greater than 50% of which was counseling/coordinating care for  esophageal cancer, needs g-tube and port a cath

## 2015-10-14 NOTE — Discharge Instructions (Signed)
Moderate Conscious Sedation, Adult, Care After Refer to this sheet in the next few weeks. These instructions provide you with information on caring for yourself after your procedure. Your health care provider may also give you more specific instructions. Your treatment has been planned according to current medical practices, but problems sometimes occur. Call your health care provider if you have any problems or questions after your procedure. WHAT TO EXPECT AFTER THE PROCEDURE  After your procedure: You may feel sleepy, clumsy, and have poor balance for several hours. Vomiting may occur if you eat too soon after the procedure. HOME CARE INSTRUCTIONS Do not participate in any activities where you could become injured for at least 24 hours. Do not: Drive. Swim. Ride a bicycle. Operate heavy machinery. Cook. Use power tools. Climb ladders. Work from a high place. Do not make important decisions or sign legal documents until you are improved. If you vomit, drink water, juice, or soup when you can drink without vomiting. Make sure you have little or no nausea before eating solid foods. Only take over-the-counter or prescription medicines for pain, discomfort, or fever as directed by your health care provider. Make sure you and your family fully understand everything about the medicines given to you, including what side effects may occur. You should not drink alcohol, take sleeping pills, or take medicines that cause drowsiness for at least 24 hours. If you smoke, do not smoke without supervision. If you are feeling better, you may resume normal activities 24 hours after you were sedated. Keep all appointments with your health care provider. SEEK MEDICAL CARE IF: Your skin is pale or bluish in color. You continue to feel nauseous or vomit. Your pain is getting worse and is not helped by medicine. You have bleeding or swelling. You are still sleepy or feeling clumsy after 24 hours. SEEK  IMMEDIATE MEDICAL CARE IF: You develop a rash. You have difficulty breathing. You develop any type of allergic problem. You have a fever. MAKE SURE YOU: Understand these instructions. Will watch your condition. Will get help right away if you are not doing well or get worse.   This information is not intended to replace advice given to you by your health care provider. Make sure you discuss any questions you have with your health care provider.   Document Released: 01/04/2013 Document Revised: 04/06/2014 Document Reviewed: 01/04/2013 Elsevier Interactive Patient Education 2016 [place] of a Feeding Tube Feeding tubes are often given to those who have trouble swallowing or cannot take food or medicine. A feeding tube can:   Go into the nose and down to the stomach.  Go through the skin in the belly (abdomen) and into the stomach or small bowel. SUPPLIES NEEDED TO CARE FOR THE TUBE SITE  Clean gloves.  Clean wash cloth, gauze pads, or soft paper towel.  Cotton swabs.  Skin barrier ointment or cream.  Soap and water.  Precut foam pads or gauze (that go around the tube).  Tube tape. TUBE SITE CARE 1. Have all supplies ready. 2. Wash hands well. 3.  Put on clean gloves. 4. Remove dirty foam pads or gauze near the tube site, if present. 5. Check the skin around the tube site for redness, rash, puffiness (swelling), leaking fluid, or extra tissue growth. Call your doctor if you see any of these. 6. Wet the gauze and cotton swabs with water and soap. 7. Wipe the area closest to the tube with cotton swabs. Wipe the surrounding skin  with moistened gauze. Rinse with water. 8. Dry the skin and tube site with a dry gauze pad or soft paper towel. Do not use antibiotic ointments at the tube site. 9. If the skin is red, apply petroleum jelly in a circular motion, using a cotton swab. Your doctor may suggest a different cream or ointment. Use what the doctor  suggests. 10. Apply a new pre-cut foam pad or gauze around the tube. Tape the edges down. Foam pads or gauze may be left off if there is no fluid at the tube site. 11. Use tape or a device that will attach your feeding tube to your skin or do as directed. Rotate where you tape the tube. 12. Sit the person up. 13. Throw away used supplies. 14. Remove gloves. 15. Wash hands. SUPPLIES NEEDED TO FLUSH A FEEDING TUBE  Clean gloves.  60 mL syringe (that connects to feeding tube).  Towel.  Water. FLUSHING A FEEDING TUBE  1. Have all supplies ready. 2. Wash hands well. 3. Put on clean gloves. 4. Pull 30 mL of water into the syringe. 5. Bend (kink) the feeding tube while disconnecting it from the feeding-bag tubing or while removing the plug at the end of the tube. 6. Insert the tip of the syringe into the end of the feeding tube. Stop bending the tube. Slowly inject the water. 7. If you cannot inject the water, the person with the feeding tube should lay on their left side. 8. After injecting the water, remove the syringe. 9. Always flush the tube before giving the first medicine, between medicines, and after the final medicine before starting a feeding. 10. Throw away used supplies. 11. Remove gloves. 12. Wash hands.   This information is not intended to replace advice given to you by your health care provider. Make sure you discuss any questions you have with your health care provider.   Document Released: 12/09/2011 Document Reviewed: 12/09/2011 Elsevier Interactive Patient Education [employer].

## 2015-10-15 ENCOUNTER — Ambulatory Visit (HOSPITAL_BASED_OUTPATIENT_CLINIC_OR_DEPARTMENT_OTHER): Payer: Medicare Other

## 2015-10-15 ENCOUNTER — Ambulatory Visit (HOSPITAL_BASED_OUTPATIENT_CLINIC_OR_DEPARTMENT_OTHER): Payer: Medicare Other | Admitting: Hematology

## 2015-10-15 ENCOUNTER — Ambulatory Visit: Payer: Medicare Other | Admitting: Nutrition

## 2015-10-15 ENCOUNTER — Other Ambulatory Visit: Payer: Self-pay | Admitting: *Deleted

## 2015-10-15 ENCOUNTER — Encounter: Payer: Self-pay | Admitting: Hematology

## 2015-10-15 ENCOUNTER — Other Ambulatory Visit (HOSPITAL_BASED_OUTPATIENT_CLINIC_OR_DEPARTMENT_OTHER): Payer: Medicare Other

## 2015-10-15 ENCOUNTER — Ambulatory Visit
Admission: RE | Admit: 2015-10-15 | Discharge: 2015-10-15 | Disposition: A | Discharge status: Home / Self Care | Payer: Medicare Other | Source: Ambulatory Visit | Attending: Radiation Oncology | Admitting: Radiation Oncology

## 2015-10-15 ENCOUNTER — Encounter: Payer: Self-pay | Admitting: *Deleted

## 2015-10-15 VITALS — BP 154/70 | HR 75 | Temp 98.0°F | Resp 20

## 2015-10-15 VITALS — BP 73/47 | HR 103 | Temp 98.3°F | Resp 20 | Ht 65.5 in | Wt 148.0 lb

## 2015-10-15 DIAGNOSIS — J9611 Chronic respiratory failure with hypoxia: Secondary | ICD-10-CM | POA: Diagnosis not present

## 2015-10-15 DIAGNOSIS — R634 Abnormal weight loss: Secondary | ICD-10-CM

## 2015-10-15 DIAGNOSIS — C155 Malignant neoplasm of lower third of esophagus: Secondary | ICD-10-CM | POA: Diagnosis not present

## 2015-10-15 DIAGNOSIS — E43 Unspecified severe protein-calorie malnutrition: Secondary | ICD-10-CM | POA: Diagnosis not present

## 2015-10-15 DIAGNOSIS — I739 Peripheral vascular disease, unspecified: Secondary | ICD-10-CM | POA: Diagnosis not present

## 2015-10-15 DIAGNOSIS — C159 Malignant neoplasm of esophagus, unspecified: Secondary | ICD-10-CM

## 2015-10-15 DIAGNOSIS — Z5111 Encounter for antineoplastic chemotherapy: Secondary | ICD-10-CM

## 2015-10-15 DIAGNOSIS — E860 Dehydration: Secondary | ICD-10-CM

## 2015-10-15 DIAGNOSIS — Z510 Encounter for antineoplastic radiation therapy: Secondary | ICD-10-CM | POA: Diagnosis not present

## 2015-10-15 LAB — CBC & DIFF AND RETIC
BASO%: 0.1 % (ref 0.0–2.0)
Basophils Absolute: 0 10*3/uL (ref 0.0–0.1)
EOS%: 1 % (ref 0.0–7.0)
Eosinophils Absolute: 0.1 10*3/uL (ref 0.0–0.5)
HCT: 40 % (ref 38.4–49.9)
HGB: 13.5 g/dL (ref 13.0–17.1)
IMMATURE RETIC FRACT: 3.7 % (ref 3.00–10.60)
LYMPH%: 9.4 % — AB (ref 14.0–49.0)
MCH: 28.8 pg (ref 27.2–33.4)
MCHC: 33.8 g/dL (ref 32.0–36.0)
MCV: 85.3 fL (ref 79.3–98.0)
MONO#: 0.9 10*3/uL (ref 0.1–0.9)
MONO%: 10.3 % (ref 0.0–14.0)
NEUT%: 79.2 % — AB (ref 39.0–75.0)
NEUTROS ABS: 6.9 10*3/uL — AB (ref 1.5–6.5)
PLATELETS: 243 10*3/uL (ref 140–400)
RBC: 4.69 10*6/uL (ref 4.20–5.82)
RDW: 12.6 % (ref 11.0–14.6)
Retic %: 0.6 % — ABNORMAL LOW (ref 0.80–1.80)
Retic Ct Abs: 28.14 10*3/uL — ABNORMAL LOW (ref 34.80–93.90)
WBC: 8.8 10*3/uL (ref 4.0–10.3)
lymph#: 0.8 10*3/uL — ABNORMAL LOW (ref 0.9–3.3)

## 2015-10-15 LAB — MAGNESIUM: Magnesium: 1.7 mg/dl (ref 1.5–2.5)

## 2015-10-15 LAB — COMPREHENSIVE METABOLIC PANEL
ALBUMIN: 3.6 g/dL (ref 3.5–5.0)
ALK PHOS: 61 U/L (ref 40–150)
ALT: <9 U/L (ref 0–55)
ANION GAP: 13 meq/L — AB (ref 3–11)
AST: 14 U/L (ref 5–34)
BILIRUBIN TOTAL: 1.17 mg/dL (ref 0.20–1.20)
BUN: 22.3 mg/dL (ref 7.0–26.0)
CO2: 28 meq/L (ref 22–29)
Calcium: 9.8 mg/dL (ref 8.4–10.4)
Chloride: 103 mEq/L (ref 98–109)
Creatinine: 1.3 mg/dL (ref 0.7–1.3)
EGFR: 53 mL/min/{1.73_m2} — AB (ref >90)
GLUCOSE: 92 mg/dL (ref 70–140)
POTASSIUM: 4.1 meq/L (ref 3.5–5.1)
SODIUM: 144 meq/L (ref 136–145)
TOTAL PROTEIN: 7.3 g/dL (ref 6.4–8.3)

## 2015-10-15 MED ORDER — SODIUM CHLORIDE 0.9% FLUSH
10.0000 mL | INTRAVENOUS | Status: DC | PRN
  Administered 2015-10-15: 10 mL
  Filled 2015-10-15: qty 10

## 2015-10-15 MED ORDER — SODIUM CHLORIDE 0.9 % IV SOLN
139.4000 mg | Freq: Once | INTRAVENOUS | Status: AC
  Administered 2015-10-15: 140 mg via INTRAVENOUS
  Filled 2015-10-15: qty 14

## 2015-10-15 MED ORDER — OSMOLITE 1.5 CAL PO LIQD: ORAL | Status: DC

## 2015-10-15 MED ORDER — SODIUM CHLORIDE 0.9 % IV SOLN
Freq: Once | INTRAVENOUS | Status: AC
  Administered 2015-10-15: 13:00:00 via INTRAVENOUS

## 2015-10-15 MED ORDER — FAMOTIDINE IN NACL 20-0.9 MG/50ML-% IV SOLN
20.0000 mg | Freq: Once | INTRAVENOUS | Status: AC
  Administered 2015-10-15: 20 mg via INTRAVENOUS

## 2015-10-15 MED ORDER — DIPHENHYDRAMINE HCL 50 MG/ML IJ SOLN
50.0000 mg | Freq: Once | INTRAMUSCULAR | Status: AC
  Administered 2015-10-15: 50 mg via INTRAVENOUS

## 2015-10-15 MED ORDER — DIPHENHYDRAMINE HCL 50 MG/ML IJ SOLN
INTRAMUSCULAR | Status: AC
  Filled 2015-10-15: qty 1

## 2015-10-15 MED ORDER — FAMOTIDINE IN NACL 20-0.9 MG/50ML-% IV SOLN
INTRAVENOUS | Status: AC
  Filled 2015-10-15: qty 50

## 2015-10-15 MED ORDER — PALONOSETRON HCL INJECTION 0.25 MG/5ML
0.2500 mg | Freq: Once | INTRAVENOUS | Status: AC
  Administered 2015-10-15: 0.25 mg via INTRAVENOUS

## 2015-10-15 MED ORDER — SODIUM CHLORIDE 0.9 % IV SOLN
50.0000 mg/m2 | Freq: Once | INTRAVENOUS | Status: AC
  Administered 2015-10-15: 90 mg via INTRAVENOUS
  Filled 2015-10-15: qty 15

## 2015-10-15 MED ORDER — HEPARIN SOD (PORK) LOCK FLUSH 100 UNIT/ML IV SOLN
500.0000 [IU] | Freq: Once | INTRAVENOUS | Status: AC | PRN
  Administered 2015-10-15: 500 [IU]
  Filled 2015-10-15: qty 5

## 2015-10-15 MED ORDER — PALONOSETRON HCL INJECTION 0.25 MG/5ML
INTRAVENOUS | Status: AC
  Filled 2015-10-15: qty 5

## 2015-10-15 MED ORDER — SODIUM CHLORIDE 0.9 % IV SOLN
20.0000 mg | Freq: Once | INTRAVENOUS | Status: AC
  Administered 2015-10-15: 20 mg via INTRAVENOUS
  Filled 2015-10-15: qty 2

## 2015-10-15 NOTE — Progress Notes (Signed)
Nutrition follow-up with patient in the infusion room being treated for esophageal cancer. Patient is status post feeding tube placement. Weight is stable and documented as 148 pounds on July 18. Patient reports he does not eat anything and regurgitates any liquids or solids when taken by mouth. Severe malnutrition continues.  Estimated nutrition needs: 1900-2100 calories, 82-95 grams protein, 2 L fluid.  Intervention:  Patient was educated to begin one half can Osmolite 1.5 4 times a day with 120 cc free water before and after bolus feedings. If tolerated patient will increase tube feedings by one can every other day until goal rate of 6 cans is achieved. Recommend Cmet, magnesium, and phosphorus twice weekly to monitor for refeeding syndrome. Patient was educated and provided with written instructions. Patient demonstrated water flush without difficulty. [facility] was notified. Questions were answered.  Teach back method used.  Tube feeding plus free water flushes will provide 2130 cal, 89 g protein, 2046 mL free water.  Monitoring, evaluation, goals:  Patient will tolerate slow tube feeding advancement to meet greater than 90% of estimated needs and avoid refeeding syndrome.  Next visit: Friday, July 21.

## 2015-10-15 NOTE — Patient Instructions (Signed)
[facility] Discharge Instructions for Patients Receiving Chemotherapy  Today you received the following chemotherapy agents Taxol and Carboplatin. To help prevent nausea and vomiting after your treatment, we encourage you to take your nausea medication as directed.  If you develop nausea and vomiting that is not controlled by your nausea medication, call the clinic.   BELOW ARE SYMPTOMS THAT SHOULD BE REPORTED IMMEDIATELY:  *FEVER GREATER THAN 100.5 F  *CHILLS WITH OR WITHOUT FEVER  NAUSEA AND VOMITING THAT IS NOT CONTROLLED WITH YOUR NAUSEA MEDICATION  *UNUSUAL SHORTNESS OF BREATH  *UNUSUAL BRUISING OR BLEEDING  TENDERNESS IN MOUTH AND THROAT WITH OR WITHOUT PRESENCE OF ULCERS  *URINARY PROBLEMS  *BOWEL PROBLEMS  UNUSUAL RASH Items with * indicate a potential emergency and should be followed up as soon as possible.  Feel free to call the clinic you have any questions or concerns. The clinic phone number is [phone].  Please show the CHEMO ALERT CARD at check-in to the Emergency Department and triage nurse.    

## 2015-10-15 NOTE — Progress Notes (Signed)
Oncology Nurse Navigator Documentation  Oncology Nurse Navigator Flowsheets 10/15/2015  Navigator Location [facility]-Med Onc  Navigator Encounter Type Treatment;Initial MedOnc  Treatment Initiated Date 10/15/2015  Patient Visit Type MedOnc  Treatment Phase Active Tx--weekly Taxol/Carboplatin with RT  Barriers/Navigation Needs No barriers at this time;No Questions;No Needs--dietici  Acuity Level 1  Time Spent with Patient 15  Met with patient during initial chemotherapy treatment to provide support and assess for needs to promote continuity of care. 1. Dietician has met with patient and his son and showed them how to do bolus tube feedings and given them directions. She has arranged for formula and supplies to be delivered to home. 2. Explained the role of nurse navigator and when to call-provided direct # to call navigator. [name] expressed a lot of frustration in regards to his health decline since he retired. Says his goal is to eat fried chicken again. Informed him that his dysphagia will get worse before it gets better. Hopefully he can eat chicken in small bites with some gravy by Christmas. Will check on him at next treatment. He reports he and his son [name], [name]. Live together and seem to be doing well. [facility] already provides DME (oxygen).  Merceda Elks, RN, BSN GI Oncology [place]

## 2015-10-16 ENCOUNTER — Ambulatory Visit: Payer: Medicare Other | Admitting: Hematology

## 2015-10-16 ENCOUNTER — Ambulatory Visit
Admission: RE | Admit: 2015-10-16 | Discharge: 2015-10-16 | Disposition: A | Discharge status: Home / Self Care | Payer: Medicare Other | Source: Ambulatory Visit | Attending: Radiation Oncology | Admitting: Radiation Oncology

## 2015-10-16 ENCOUNTER — Other Ambulatory Visit: Payer: Medicare Other

## 2015-10-16 DIAGNOSIS — Z510 Encounter for antineoplastic radiation therapy: Secondary | ICD-10-CM | POA: Diagnosis not present

## 2015-10-17 ENCOUNTER — Ambulatory Visit
Admission: RE | Admit: 2015-10-17 | Discharge: 2015-10-17 | Disposition: A | Discharge status: Home / Self Care | Payer: Medicare Other | Source: Ambulatory Visit | Attending: Radiation Oncology | Admitting: Radiation Oncology

## 2015-10-17 DIAGNOSIS — Z510 Encounter for antineoplastic radiation therapy: Secondary | ICD-10-CM | POA: Diagnosis not present

## 2015-10-18 ENCOUNTER — Encounter: Payer: Self-pay | Admitting: Radiation Oncology

## 2015-10-18 ENCOUNTER — Ambulatory Visit
Admission: RE | Admit: 2015-10-18 | Discharge: 2015-10-18 | Disposition: A | Discharge status: Home / Self Care | Payer: Medicare Other | Source: Ambulatory Visit | Attending: Radiation Oncology | Admitting: Radiation Oncology

## 2015-10-18 ENCOUNTER — Other Ambulatory Visit (HOSPITAL_COMMUNITY)
Admission: AD | Admit: 2015-10-18 | Discharge: 2015-10-18 | Disposition: A | Discharge status: Home / Self Care | Payer: Medicare Other | Source: Ambulatory Visit | Attending: Hematology | Admitting: Hematology

## 2015-10-18 ENCOUNTER — Other Ambulatory Visit (HOSPITAL_BASED_OUTPATIENT_CLINIC_OR_DEPARTMENT_OTHER): Payer: Medicare Other

## 2015-10-18 ENCOUNTER — Ambulatory Visit: Payer: Medicare Other | Admitting: Nutrition

## 2015-10-18 VITALS — BP 123/73 | HR 96 | Temp 98.0°F | Resp 22 | Wt 148.0 lb

## 2015-10-18 DIAGNOSIS — Z510 Encounter for antineoplastic radiation therapy: Secondary | ICD-10-CM | POA: Diagnosis not present

## 2015-10-18 DIAGNOSIS — C155 Malignant neoplasm of lower third of esophagus: Secondary | ICD-10-CM | POA: Diagnosis present

## 2015-10-18 DIAGNOSIS — C159 Malignant neoplasm of esophagus, unspecified: Secondary | ICD-10-CM | POA: Diagnosis not present

## 2015-10-18 LAB — COMPREHENSIVE METABOLIC PANEL
ALT: <9 U/L (ref 0–55)
AST: 17 U/L (ref 5–34)
Albumin: 3.5 g/dL (ref 3.5–5.0)
Alkaline Phosphatase: 57 U/L (ref 40–150)
Anion Gap: 11 mEq/L (ref 3–11)
BUN: 27.3 mg/dL — ABNORMAL HIGH (ref 7.0–26.0)
CO2: 29 meq/L (ref 22–29)
Calcium: 9.7 mg/dL (ref 8.4–10.4)
Chloride: 104 mEq/L (ref 98–109)
Creatinine: 1 mg/dL (ref 0.7–1.3)
EGFR: 69 mL/min/{1.73_m2} — AB (ref >90)
GLUCOSE: 134 mg/dL (ref 70–140)
POTASSIUM: 4.6 meq/L (ref 3.5–5.1)
SODIUM: 143 meq/L (ref 136–145)
Total Bilirubin: 0.82 mg/dL (ref 0.20–1.20)
Total Protein: 7.1 g/dL (ref 6.4–8.3)

## 2015-10-18 LAB — MAGNESIUM: Magnesium: 1.8 mg/dl (ref 1.5–2.5)

## 2015-10-18 LAB — PHOSPHORUS: PHOSPHORUS: 3 mg/dL (ref 2.5–4.6)

## 2015-10-18 NOTE — Progress Notes (Addendum)
Weekly rad txs esophagus 7/30 fxs completed, no skin irritation, patient has a peg tube with  Started using osmolite 1 can   And flush with water before and after wards,  Daily to increas , vomited yesterdqay after radiation driving home, said dry heaves and saliva,  Was better once home and fell asleep, has zofran prn to use, in w/c, using 3 liter oxygen n/c 4:08 PM BP 123/73 mmHg  Pulse 96  Temp(Src) 98 F (36.7 C) (Oral)  Resp 22  Wt 148 lb (67.132 kg)  SpO2 96%  Wt Readings from Last 3 Encounters:  10/18/15 148 lb (67.132 kg)  10/18/15 148 lb (67.132 kg)  10/15/15 148 lb (67.132 kg)   4:07 PM

## 2015-10-18 NOTE — Progress Notes (Signed)
Nutrition follow-up completed with patient and son.  Patient is receiving radiation therapy for esophageal cancer. Patient reports tolerating Osmolite 1.5 one can 4 times a day via PEG. Weight is stable and was documented as 148 pounds July 21. Patient reports he had a bowel movement yesterday. Patient also reports 1 episode of nausea and vomiting yesterday after radiation therapy. Patient complains of feeling weak and fatigued.  Labs reviewed and noted as follows: Potassium 4.1, albumin 3.6, glucose 92, BUN 22.3, creatinine 1.3 and magnesium 1.7.  Estimated nutrition needs: 1900-2100 calories, 82-95 grams protein, 2 L fluid.  Intervention:  Patient educated to take nausea medication as needed. Patient will increase Osmolite 1.5 working to goal rate of 1-1/2 cans 4 times a day with 120 cc free water before and after bolus feeding. Tube feeding free water flush provides 2130 cal, 89 g protein, 2046 mL free water needing greater than 90% of estimated needs. Questions were answered.  Teach back method used.    Monitoring, evaluation, goals: Patient will tolerate tube feeding advancement to goal rate to meet greater than 90% of estimated needs and avoid refeeding syndrome.  Next visit: Wednesday, July 26 during infusion.  **Disclaimer: This note was dictated with voice recognition software. Similar sounding words can inadvertently be transcribed and this note may contain transcription errors which may not have been corrected upon publication of note.**

## 2015-10-19 NOTE — Progress Notes (Signed)
  Radiation Oncology         [phone] ________________________________  Name: [name] MRN: [id number]  Date: 09/27/2015  DOB: Aug 15, 1931  SIMULATION AND TREATMENT PLANNING NOTE  DIAGNOSIS:     ICD-9-CM ICD-10-CM   1. Cancer of lower third of esophagus ([place]) 150.5 C15.5      Site:  Lower esophagus  NARRATIVE:  The patient was brought to the [facility].  Identity was confirmed.  All relevant records and images related to the planned course of therapy were reviewed.   Written consent to proceed with treatment was confirmed which was freely given after reviewing the details related to the planned course of therapy had been reviewed with the patient.  Then, the patient was set-up in a stable reproducible  supine position for radiation therapy.  CT images were obtained.  Surface markings were placed.    Medically necessary complex treatment device(s) for immobilization:  Customized vac lock bag.   The CT images were loaded into the planning software.  Then the target and avoidance structures were contoured.  Treatment planning then occurred.  The radiation prescription was entered and confirmed.  I have requested : Intensity Modulated Radiotherapy (IMRT) is medically necessary for this case for the following reason:  Proximity to critical normal structures including the heart, lungs, spinal cord.   PLAN:  The patient will initially receive 45 Gy in 25 fractions using a simultaneous integrated boost technique. The gross tumor volume will receive simultaneously a dose of 50 gray in 25 fractions.  Special treatment procedure The patient will also receive concurrent chemotherapy during the treatment. The patient may therefore experience increased toxicity or side effects and the patient will be monitored for such problems. This may require extra lab work as necessary. This therefore constitutes a special treatment procedure.   ________________________________   Jodelle Gross, MD, PhD

## 2015-10-19 NOTE — Progress Notes (Signed)
Department of Radiation Oncology  Phone:  [phone] Fax:        [phone]  Weekly Treatment Note    Name: [name] Date: 10/19/2015 MRN: [id number] DOB: 05-25-1931   Diagnosis:     ICD-9-CM ICD-10-CM   1. Cancer of lower third of esophagus (HCC) 150.5 C15.5      Current dose: 14 Gy  Current fraction: 7   MEDICATIONS: Current Outpatient Prescriptions  Medication Sig Dispense Refill  . albuterol (PROAIR HFA) 108 (90 BASE) MCG/ACT inhaler Inhale 2 puffs into the lungs every 6 (six) hours as needed. For shortness of breath 1 Inhaler 2  . albuterol (PROVENTIL) (2.5 MG/3ML) 0.083% nebulizer solution Take 3 mLs (2.5 mg total) by nebulization 4 (four) times daily as needed. For shortness of breath 75 mL 11  . aspirin 81 MG chewable tablet Chew 81 mg by mouth daily. Reported on 10/11/2015    . atorvastatin (LIPITOR) 40 MG tablet Take 40 mg by mouth daily. Reported on 10/11/2015    . cholecalciferol (VITAMIN D) 1000 units tablet Take 1,000 Units by mouth daily.    . clopidogrel (PLAVIX) 75 MG tablet Take 75 mg by mouth daily. Reported on 10/11/2015    . dexamethasone (DECADRON) 4 MG tablet Take 2 tablets (8 mg total) by mouth daily. Start the day after chemotherapy for 2 days. 30 tablet 1  . folic acid (FOLVITE) 1 MG tablet Take 1 mg by mouth daily. Reported on 10/11/2015    . losartan (COZAAR) 25 MG tablet Take 25 mg by mouth daily.    . metoprolol tartrate (LOPRESSOR) 25 MG tablet Take 25 mg by mouth daily.    . Nutritional Supplements (FEEDING SUPPLEMENT, OSMOLITE 1.5 CAL,) LIQD Begin 1/2 can Osmolite 1.5 or equivalent via PEG QID with 120 cc free water before and after. Increase by one can every other day to goal of 6 cans daily (1.5 cans QID) as tolerated. 1422 mL 0  . ondansetron (ZOFRAN) 8 MG tablet Take 1 tablet (8 mg total) by mouth 2 (two) times daily as needed for refractory nausea / vomiting. Start on day 3 after chemo. 30 tablet 1  . prochlorperazine (COMPAZINE) 10  MG tablet Take 1 tablet (10 mg total) by mouth every 6 (six) hours as needed (Nausea or vomiting). 30 tablet 1  . vitamin B-12 (CYANOCOBALAMIN) 1000 MCG tablet Take 1,000 mcg by mouth daily.    . Wound Dressings (SONAFINE) Apply 1 application topically 2 (two) times daily.    [name] lidocaine-prilocaine (EMLA) cream Apply to affected area once (Patient not taking: Reported on 10/11/2015) 30 g 3   No current facility-administered medications for this encounter.     ALLERGIES: Symbicort and Tetanus toxoids   LABORATORY DATA:  Lab Results  Component Value Date   WBC 8.8 10/15/2015   HGB 13.5 10/15/2015   HCT 40.0 10/15/2015   MCV 85.3 10/15/2015   PLT 243 10/15/2015   Lab Results  Component Value Date   NA 143 10/18/2015   K 4.6 10/18/2015   CL 104 10/14/2015   CO2 29 10/18/2015   Lab Results  Component Value Date   ALT <9 10/18/2015   AST 17 10/18/2015   ALKPHOS 57 10/18/2015   BILITOT 0.82 10/18/2015     NARRATIVE: [name] was seen today for weekly treatment management. The chart was checked and the patient's films were reviewed.  Weekly rad txs esophagus 7/30 fxs completed, no skin irritation, patient has a peg tube with  Started  using osmolite 1 can   And flush with water before and after wards,  Daily to increas , vomited yesterdqay after radiation driving home, said dry heaves and saliva,  Was better once home and fell asleep, has zofran prn to use, in w/c, using 3 liter oxygen n/c 9:44 AM BP 123/73 mmHg  Pulse 96  Temp(Src) 98 F (36.7 C) (Oral)  Resp 22  Wt 148 lb (67.132 kg)  SpO2 96%  Wt Readings from Last 3 Encounters:  10/18/15 148 lb (67.132 kg)  10/18/15 148 lb (67.132 kg)  10/15/15 148 lb (67.132 kg)   9:44 AM   PHYSICAL EXAMINATION: weight is 148 lb (67.132 kg). His oral temperature is 98 F (36.7 C). His blood pressure is 123/73 and his pulse is 96. His respiration is 22 and oxygen saturation is 96%.        ASSESSMENT: The patient is doing  satisfactorily with treatment.  PLAN: We will continue with the patient's radiation treatment as planned.

## 2015-10-19 NOTE — Addendum Note (Signed)
Encounter addended by: Kyung Rudd, MD on: 10/19/2015 10:14 AM<BR>     Documentation filed: Notes Section, Visit Diagnoses

## 2015-10-21 ENCOUNTER — Ambulatory Visit
Admission: RE | Admit: 2015-10-21 | Discharge: 2015-10-21 | Disposition: A | Discharge status: Home / Self Care | Payer: Medicare Other | Source: Ambulatory Visit | Attending: Radiation Oncology | Admitting: Radiation Oncology

## 2015-10-21 DIAGNOSIS — Z510 Encounter for antineoplastic radiation therapy: Secondary | ICD-10-CM | POA: Diagnosis not present

## 2015-10-22 ENCOUNTER — Ambulatory Visit
Admission: RE | Admit: 2015-10-22 | Discharge: 2015-10-22 | Disposition: A | Discharge status: Home / Self Care | Payer: Medicare Other | Source: Ambulatory Visit | Attending: Radiation Oncology | Admitting: Radiation Oncology

## 2015-10-22 ENCOUNTER — Other Ambulatory Visit (HOSPITAL_BASED_OUTPATIENT_CLINIC_OR_DEPARTMENT_OTHER): Payer: Medicare Other

## 2015-10-22 ENCOUNTER — Other Ambulatory Visit (HOSPITAL_COMMUNITY)
Admission: AD | Admit: 2015-10-22 | Discharge: 2015-10-22 | Disposition: A | Discharge status: Home / Self Care | Payer: Medicare Other | Source: Ambulatory Visit | Attending: Hematology | Admitting: Hematology

## 2015-10-22 DIAGNOSIS — C155 Malignant neoplasm of lower third of esophagus: Secondary | ICD-10-CM | POA: Diagnosis present

## 2015-10-22 DIAGNOSIS — Z510 Encounter for antineoplastic radiation therapy: Secondary | ICD-10-CM | POA: Diagnosis not present

## 2015-10-22 DIAGNOSIS — C159 Malignant neoplasm of esophagus, unspecified: Secondary | ICD-10-CM | POA: Diagnosis not present

## 2015-10-22 LAB — CBC & DIFF AND RETIC
BASO%: 0.4 % (ref 0.0–2.0)
Basophils Absolute: 0 10*3/uL (ref 0.0–0.1)
EOS ABS: 0.4 10*3/uL (ref 0.0–0.5)
EOS%: 7.6 % — ABNORMAL HIGH (ref 0.0–7.0)
HCT: 39 % (ref 38.4–49.9)
HEMOGLOBIN: 13.3 g/dL (ref 13.0–17.1)
IMMATURE RETIC FRACT: 3.3 % (ref 3.00–10.60)
LYMPH%: 11.2 % — ABNORMAL LOW (ref 14.0–49.0)
MCH: 29 pg (ref 27.2–33.4)
MCHC: 34.1 g/dL (ref 32.0–36.0)
MCV: 85.2 fL (ref 79.3–98.0)
MONO#: 0.4 10*3/uL (ref 0.1–0.9)
MONO%: 6.6 % (ref 0.0–14.0)
NEUT%: 74.2 % (ref 39.0–75.0)
NEUTROS ABS: 3.9 10*3/uL (ref 1.5–6.5)
Platelets: 194 10*3/uL (ref 140–400)
RBC: 4.58 10*6/uL (ref 4.20–5.82)
RDW: 12.6 % (ref 11.0–14.6)
Retic Ct Abs: 14.2 10*3/uL — ABNORMAL LOW (ref 34.80–93.90)
WBC: 5.3 10*3/uL (ref 4.0–10.3)
lymph#: 0.6 10*3/uL — ABNORMAL LOW (ref 0.9–3.3)

## 2015-10-22 LAB — COMPREHENSIVE METABOLIC PANEL
ALBUMIN: 3.3 g/dL — AB (ref 3.5–5.0)
ALK PHOS: 54 U/L (ref 40–150)
ALT: 9 U/L (ref 0–55)
AST: 14 U/L (ref 5–34)
Anion Gap: 9 mEq/L (ref 3–11)
BILIRUBIN TOTAL: 0.95 mg/dL (ref 0.20–1.20)
BUN: 20.9 mg/dL (ref 7.0–26.0)
CALCIUM: 10 mg/dL (ref 8.4–10.4)
CO2: 31 mEq/L — ABNORMAL HIGH (ref 22–29)
Chloride: 102 mEq/L (ref 98–109)
Creatinine: 1.1 mg/dL (ref 0.7–1.3)
EGFR: 65 mL/min/{1.73_m2} — AB (ref >90)
Glucose: 111 mg/dl (ref 70–140)
Potassium: 4.8 mEq/L (ref 3.5–5.1)
Sodium: 142 mEq/L (ref 136–145)
TOTAL PROTEIN: 6.7 g/dL (ref 6.4–8.3)

## 2015-10-22 LAB — MAGNESIUM: MAGNESIUM: 1.7 mg/dL (ref 1.5–2.5)

## 2015-10-22 LAB — PHOSPHORUS: PHOSPHORUS: 3 mg/dL (ref 2.5–4.6)

## 2015-10-22 NOTE — Progress Notes (Signed)
[name]    HEMATOLOGY/ONCOLOGY CONSULTATION NOTE  Date of Service: 10/15/2015  Patient Care Team: [name], MD as PCP - General (Family Medicine) Kathee Delton, MD (Inactive) (Pulmonary Disease) Adrian Prows, MD as Attending Physician (Cardiology)  CHIEF COMPLAINTS/PURPOSE OF CONSULTATION:  Newly diagnosed Esophageal squamous cell carcinoma  INTERVAL HISTORY  [name] is here for his scheduled follow-up. He had uneventful placement of a G-tube and port. No evident issues with excessive bleeding. Breathing has been stable. No issues with radiation at this time. He is meeting [name] for starting tube feeding today. No other acute new symptoms. Did feel better after the IV fluids. Echo was done on 10/11/2015 and showed an ejection fraction of 35-40%. Also noted to have moderate to severe aortic stenosis and moderate mitral regurgitation.   MEDICAL HISTORY:  Past Medical History:  Diagnosis Date  . Anemia   . CAD (coronary artery disease)    Dr Einar Gip every 6 months  . Cardiomyopathy    EF 25% by echo 2009  . Carotid artery occlusion   . COPD (chronic obstructive pulmonary disease) ([place])    on O2 constantly  . DVT (deep venous thrombosis) ([place])   . Emphysema   . Esophageal cancer ([place])    'causes chest discomfort, liquids and solids"  . History of oxygen administration    Oxygen 3 l/m at rest / 4 l/m when active. 24/7  . Hypertension   . Keeps losing balance   . Myocardial infarction ([place]) 1986  . Peripheral vascular disease ([place])   . Pulmonary nodule    s/p wedge resction, inflammatory, +MAC  . Shortness of breath   . Vitamin B 12 deficiency     SURGICAL HISTORY: Past Surgical History:  Procedure Laterality Date  . ARCH AORTOGRAM N/A 07/21/2011   Procedure: ARCH AORTOGRAM;  Surgeon: Laverda Page, MD;  [facility] C[facility] LAB;  Service: Cardiovascular;  Laterality: N/A;  . CAROTID ENDARTERECTOMY  2009   triple bypass  . CAROTID-VERTEBRAL BYPASS GRAFT  08/20/2011    Procedure: BYPASS GRAFT CAROTID-VERTEBRAL;  Surgeon: Serafina Mitchell, MD;  Location: [facility] OR;  Service: Vascular;  Laterality: Right;  Right CAROTID-VERTEBRAL TRANSPOSITION  . CORONARY ARTERY BYPASS GRAFT  2009  . ESOPHAGOGASTRODUODENOSCOPY (EGD) WITH PROPOFOL N/A 09/19/2015   Procedure: ESOPHAGOGASTRODUODENOSCOPY (EGD) WITH PROPOFOL;  Surgeon: Clarene Essex, MD;  Location: [facility] ENDOSCOPY;  Service: Endoscopy;  Laterality: N/A;  . LUNG SURGERY  2008   tumor"infection"  . PR VEIN BYPASS GRAFT,AORTO-FEM-POP    . SAVORY DILATION N/A 09/19/2015   Procedure: SAVORY DILATION;  Surgeon: Clarene Essex, MD;  Location: [facility] ENDOSCOPY;  Service: Endoscopy;  Laterality: N/A;  needs carm  . VASCULAR SURGERY      SOCIAL HISTORY: Social History   Social History  . Marital status: Divorced    Spouse name: N/A  . Number of children: N/A  . Years of education: N/A   Occupational History  . Retired Retired    Charity fundraiser   Social History Main Topics  . Smoking status: Former Smoker    Packs/day: 2.00    Years: 56.00    Types: Cigarettes    Quit date: 03/30/2006  . Smokeless tobacco: Never Used  . Alcohol use 2.4 oz/week    4 Standard drinks or equivalent per week  . Drug use: No  . Sexual activity: Not on file   Other Topics Concern  . Not on file   Social History Narrative  . No narrative on file  FAMILY HISTORY: Family History  Problem Relation Age of Onset  . Heart disease Father   . Diabetes Sister   . Anesthesia problems Neg Hx     ALLERGIES:  is allergic to symbicort [budesonide-formoterol fumarate] and tetanus toxoids.  MEDICATIONS:  Current Outpatient Prescriptions  Medication Sig Dispense Refill  . albuterol (PROAIR HFA) 108 (90 BASE) MCG/ACT inhaler Inhale 2 puffs into the lungs every 6 (six) hours as needed. For shortness of breath 1 Inhaler 2  . albuterol (PROVENTIL) (2.5 MG/3ML) 0.083% nebulizer solution Take 3 mLs (2.5 mg total) by nebulization 4 (four) times daily as needed.  For shortness of breath 75 mL 11  . aspirin 81 MG chewable tablet Chew 81 mg by mouth daily. Reported on 10/11/2015    . atorvastatin (LIPITOR) 40 MG tablet Take 40 mg by mouth daily. Reported on 10/11/2015    . cholecalciferol (VITAMIN D) 1000 units tablet Take 1,000 Units by mouth daily.    . clopidogrel (PLAVIX) 75 MG tablet Take 75 mg by mouth daily. Reported on 10/11/2015    . dexamethasone (DECADRON) 4 MG tablet Take 2 tablets (8 mg total) by mouth daily. Start the day after chemotherapy for 2 days. 30 tablet 1  . folic acid (FOLVITE) 1 MG tablet Take 1 mg by mouth daily. Reported on 10/11/2015    . lidocaine-prilocaine (EMLA) cream Apply to affected area once (Patient not taking: Reported on 10/11/2015) 30 g 3  . losartan (COZAAR) 25 MG tablet Take 25 mg by mouth daily.    . metoprolol tartrate (LOPRESSOR) 25 MG tablet Take 25 mg by mouth daily.    . Nutritional Supplements (FEEDING SUPPLEMENT, OSMOLITE 1.5 CAL,) LIQD Begin 1/2 can Osmolite 1.5 or equivalent via PEG QID with 120 cc free water before and after. Increase by one can every other day to goal of 6 cans daily (1.5 cans QID) as tolerated. 1422 mL 0  . ondansetron (ZOFRAN) 8 MG tablet Take 1 tablet (8 mg total) by mouth 2 (two) times daily as needed for refractory nausea / vomiting. Start on day 3 after chemo. 30 tablet 1  . prochlorperazine (COMPAZINE) 10 MG tablet Take 1 tablet (10 mg total) by mouth every 6 (six) hours as needed (Nausea or vomiting). 30 tablet 1  . vitamin B-12 (CYANOCOBALAMIN) 1000 MCG tablet Take 1,000 mcg by mouth daily.    . Wound Dressings (SONAFINE) Apply 1 application topically 2 (two) times daily.     No current facility-administered medications for this visit.     REVIEW OF SYSTEMS:    10 Point review of Systems was done is negative except as noted above.  PHYSICAL EXAMINATION: ECOG PERFORMANCE STATUS:1-2 . Vitals:   10/15/15 1028  BP: (!) 73/47  Pulse: (!) 103  Resp: 20  Temp: 98.3 F (36.8 C)     Filed Weights   10/15/15 1028  Weight: 148 lb (67.1 kg)   .Body mass index is 24.25 kg/m.  GENERAL:alert, in no acute distress and comfortable SKIN: skin color, texture, turgor are normal, no rashes or significant lesions EYES: normal, conjunctiva are pink and non-injected, sclera clear OROPHARYNX:no exudate, no erythema and lips, buccal mucosa, and tongue normal  NECK: supple, no JVD, thyroid normal size, non-tender, without nodularity LYMPH:  no palpable lymphadenopathy in the cervical, axillary or inguinal LUNGS: clear to auscultation with Decreased air entry bilaterally.  HEART: regular rate & rhythm,  no murmurs and no lower extremity edema ABDOMEN: abdomen soft, non-tender, normoactive bowel sounds  Musculoskeletal:  no cyanosis of digits and no clubbing  PSYCH: alert & oriented x 3 with fluent speech NEURO: no focal motor/sensory deficits  LABORATORY DATA:  I have reviewed the data as listed  . CBC Latest Ref Rng & Units 10/15/2015 10/14/2015 10/09/2015  WBC 4.0 - 10.3 10e3/uL 8.8 9.2 7.9  Hemoglobin 13.0 - 17.1 g/dL 13.5 13.8 14.1  Hematocrit 38.4 - 49.9 % 40.0 41.7 42.1  Platelets 140 - 400 10e3/uL 243 271 250   . CMP Latest Ref Rng & Units 10/15/2015 10/14/2015  Glucose 70 - 140 mg/dl 92 109(H)  BUN 7.0 - 26.0 mg/dL 22.3 18  Creatinine 0.7 - 1.3 mg/dL 1.3 1.28(H)  Sodium 136 - 145 mEq/L 144 146(H)  Potassium 3.5 - 5.1 mEq/L 4.1 4.1  Chloride 101 - 111 mmol/L - 104  CO2 22 - 29 mEq/L 28 30  Calcium 8.4 - 10.4 mg/dL 9.8 9.9  Total Protein 6.4 - 8.3 g/dL 7.3 -  Total Bilirubin 0.20 - 1.20 mg/dL 1.17 -  Alkaline Phos 40 - 150 U/L 61 -  AST 5 - 34 U/L 14 -  ALT 0 - 55 U/L <9 -      RADIOGRAPHIC STUDIES:   EGD 09/19/2015  - Normal larynx. - Partially obstructing, malignant esophageal tumor was found in the lower third of the   esophagus. Biopsied. - Normal upper third of esophagus and middle third of esophagus. - The examination was otherwise normal. but  unable to pass scope past the mass  I have personally reviewed the radiological images as listed and agreed with the findings in the report.  Nm Pet Image Initial (pi) Skull Base To Thigh  Result Date: 09/27/2015 CLINICAL DATA:  Initial Treatment strategy for esophageal mass. EXAM: NUCLEAR MEDICINE PET SKULL BASE TO THIGH TECHNIQUE: 7.8 mCi F-18 FDG was injected intravenously. Full-ring PET imaging was performed from the skull base to thigh after the radiotracer. CT data was obtained and used for attenuation correction and anatomic localization. FASTING BLOOD GLUCOSE:  Value: 109 mg/dl COMPARISON:  01/25/2007 FINDINGS: NECK Accentuated but relatively symmetric lymphoid activity at the tongue base and tonsillar pillars, maximum SUV 8.7. Accentuated activity along the glottis is reasonably symmetric and probably physiologic. There is some high activity in the cervical esophagus which is thought to likely be physiologic. CHEST Dilated esophagus with frothy material extending down to a distal esophageal mass which has maximum standard uptake value of 30.7 and which extends over approximately 6 cm length of the esophagus. In addition, there is a second focus of accentuated esophageal activity in the distal esophagus near the gastroesophageal junction, measuring about 2.5 cm in length, with maximum standard uptake value 14.9. Coronary, aortic arch, and branch vessel atherosclerotic vascular disease. Prior CABG. Hypermetabolic right hilar lymph node with maximum SUV 5.0. An adjacent tiny right lower paratracheal node adjacent to the right mainstem bronchus has maximum SUV of 4.7. Subcarinal node measures 0.8 cm in short axis on image 76/4 and has a maximum SUV a 4.5. Severe emphysema. There is some scarring in the left lung, likely postoperative. ABDOMEN/PELVIS Gastrohepatic ligament lymph node measuring 1.5 cm in short axis on image 108/4 with maximum standard uptake value 9.9. I do not observe a liver mass. Right  adrenal adenoma measures 2.6 by 2.1 cm, density 6 Hounsfield units, minimal metabolic activity. Aortoiliac atherosclerotic vascular disease. Cholelithiasis noted with layering gallstones in the gallbladder. Mildly prominent prostate gland. Postoperative findings along the left groin. SKELETON No focal hypermetabolic activity to suggest skeletal metastasis.  IMPRESSION: 1. 6 cm long hypermetabolic region in the distal esophagus compatible with esophageal cancer (max SUV 30.7). There is a second 2.5 cm region of hypermetabolic activity in the distal most esophagus just proximal to the gastroesophageal junction which could represent a second focus of cancer (max SUV 14.9). The there is enlarged gastrohepatic ligament lymph node which is highly hypermetabolic (max SUV 9.9), and faint hypermetabolic activity in several small mediastinal lymph nodes including a right hilar node, a small lymph node anterior to the right mainstem bronchus, and a subcarinal lymph node. 2. Other imaging findings of potential clinical significance: Coronary, aortic arch, and branch vessel atherosclerotic vascular disease. Prior CABG Aortoiliac atherosclerotic vascular disease. Severe emphysema. Scarring in the left upper lung, postoperative. Right adrenal adenoma. Cholelithiasis. Mildly prominent prostate gland. Electronically Signed   By: Van Clines M.D.   On: 09/27/2015 09:42   ASSESSMENT & PLAN:   82 year old [place] male with multiple medical comorbidities including significant coronary artery disease status post bypass and ischemic cardiomyopathy, COPD which is oxygen dependent at 3 L/min, peripheral vascular disease status post stenting of his lower extremity arteries on multiple antiplatelet therapies now with.  #1 Stage III multifocal lower third of the esophagus invasive squamous cell carcinoma with an enlarged gastrohepatic ligament lymph node and faint hypermetabolic several small mediastinal lymph nodes including a  right hilar lymph node and a subcarinal lymph node. No overt distal metastatic disease noted.   His previous heavy alcohol use and smoking are the likely etiology. He notes that he has quit heavy smoking and drinks only 2-3 beers a week since his cardiac bypass surgery in 2009. Patient has an ECOG performance status of 2  #2 severe protein calorie malnutrition - now has G-tube placement and she'll be starting tube feeding. Plan -Patient's labs are stable and he will start his weekly carboplatin Taxol today along with ongoing, current radiation therapy he has had chemotherapy counseling and all his questions regarding chemotherapy have been answered in details and his informed consent was obtained.  -Patient will continue to follow up with [name] for G-tube feeding . -IV fluids for dehydration -Weekly labs on follow-up  #2 CAD status post CABG, ischemic cardiomyopathy ejection fraction 25% in 2009. -Repeat echocardiogram shows improvement in ejection fraction 35-40%. Patient also noted to have severe calcific aortic stenosis with moderate to severe aortic stenosis and moderate mitral regurgitation. Plan -Patient follows with Dr. Einar Gip for his cardiology needs.  #3 peripheral vascular disease. Has stents in his lower extremity. Currently on aspirin plus Plavix plus cilostazol. - Plavix on hold till G-tube placement .  #4 ex-smoker more than 100-pack-year history of smoking. #5 Ex heavy alcohol use #6 COPD with chronic hypoxic respiratory failure on 3 L/min oxygen by nasal cannula. Plan -Continue follow-up with primary care physician for ongoing management of his multiple medical comorbidities.  All of the patients questions were answered with apparent satisfaction. The patient knows to call the clinic with any problems, questions or concerns.  I spent 25 minutes counseling the patient face to face. The total time spent in the appointment was 25 minutes and more than 50% was on  counseling and direct patient cares.    Roberto Lone MD [place] AAHIVMS [facility] Hematology/Oncology Physician [facility]  (Office):       [phone] (Work cell):  [phone] (Fax):           [phone]

## 2015-10-22 NOTE — Progress Notes (Signed)
[name]    HEMATOLOGY/ONCOLOGY CONSULTATION NOTE  Date of Service: 10/09/2015  Patient Care Team: [name], MD as PCP - General (Family Medicine) Kathee Delton, MD (Inactive) (Pulmonary Disease) Adrian Prows, MD as Attending Physician (Cardiology)  CHIEF COMPLAINTS/PURPOSE OF CONSULTATION:  Newly diagnosed Esophageal squamous cell carcinoma  HISTORY OF PRESENTING ILLNESS:   [name] is a wonderful 82 y.o. male who has been referred to [place] by Dr .[name], MD for evaluation and management of newly diagnosed lower esophageal squamous cell carcinoma.  Patient is originally from [place] but speaks good [place]. He has multiple medical comorbidities including coronary disease status post MI and CABG with ischemic cardiomyopathy ejection fraction 25% in 2009, COPD on chronic O2 at 3 L/min, history of pulmonary nodule wedge resection in 2008 which was positive for MAC (patient notes he has been on oxygen since), history of DVT in 2009 status post cardiac bypass, hypertension, peripheral vascular disease, B12 deficiency.  Patient notes that he still mows his yard and is quite functionally active even with his multiple medical comorbidities.  He notes that he developed increasing dysphagia to solids first noticed about 2-2.5 months ago. This was associated with about a 15-20 pound weight loss. Patient notes that he has some evident dysphagia to liquids as well now. Some food items "go down" better than others. He also notes a sensation of food getting stuck and fullness in his lower retrosternal area.  Reports no hematemesis. No melena. No significant abdominal pain.  Patient had a vasovagal [name] for further evaluation on 09/12/2015 that identified a constricting lesion in the mid distal esophagus with mucosal irregularity most consistent with an esophageal carcinoma over several centimeters length. He was also noted to have a paraesophageal hernia.  Patient subsequently had an EGD on  09/19/2015 with Dr. Watt Climes revealing a large ulcerating mass with bleeding in the lower third of the esophagus, 32 cm from the incisors. [name] was partially obstructing and circumferential. Biopsy of the distal esophageal mass is consistent with a squamous cell carcinoma with areas of invasion.  Patient has been set up for a PET/CT scan for staging tomorrow morning. He has also been set up for a nutritional consultation with Ernestene Kiel on 10/03/2015 and a surgical consultation with Dr. Servando Snare on 10/08/2015.  Patient reports no other acute new symptoms at this time. He is able to eat chocolate pudding and pureed food items and liquids. No overt aspiration events. He does however report that he tried to eat fried chicken and felt like it was getting stuck and had to regurgitate it.   INTERVAL HISTORY  [name] is here for his scheduled follow-up. He was seen in the symptom management clinic on Friday with dehydration and hypernatremia and some bump in his kidney function. He received IV fluids carefully in the setting of known CHF. He notes that he feels much better after the fluids. Still has very marginal oral food and fluid intake. We discussed the need for additional IV fluids which she was set up for today. We also discussed the need for a port placement and G-tube placement. That these were set up with interventional radiology. No fevers or chills. We are holding his first weekly carbotaxol chemotherapy to his dehydration and renal function are stable. I had a discussion with the radiation oncology PA for this. Son was present for this discussion  MEDICAL HISTORY:  Past Medical History:  Diagnosis Date  . Anemia   . CAD (coronary artery disease)  Dr Einar Gip every 6 months  . Cardiomyopathy    EF 25% by echo 2009  . Carotid artery occlusion   . COPD (chronic obstructive pulmonary disease) ([place])    on O2 constantly  . DVT (deep venous thrombosis) ([place])   . Emphysema   . Esophageal cancer  ([place])    'causes chest discomfort, liquids and solids"  . History of oxygen administration    Oxygen 3 l/m at rest / 4 l/m when active. 24/7  . Hypertension   . Keeps losing balance   . Myocardial infarction ([place]) 1986  . Peripheral vascular disease ([place])   . Pulmonary nodule    s/p wedge resction, inflammatory, +MAC  . Shortness of breath   . Vitamin B 12 deficiency     SURGICAL HISTORY: Past Surgical History:  Procedure Laterality Date  . ARCH AORTOGRAM N/A 07/21/2011   Procedure: ARCH AORTOGRAM;  Surgeon: Laverda Page, MD;  Location: [facility] CATH LAB;  Service: Cardiovascular;  Laterality: N/A;  . CAROTID ENDARTERECTOMY  2009   triple bypass  . CAROTID-VERTEBRAL BYPASS GRAFT  08/20/2011   Procedure: BYPASS GRAFT CAROTID-VERTEBRAL;  Surgeon: Serafina Mitchell, MD;  Location: [facility] OR;  Service: Vascular;  Laterality: Right;  Right CAROTID-VERTEBRAL TRANSPOSITION  . CORONARY ARTERY BYPASS GRAFT  2009  . ESOPHAGOGASTRODUODENOSCOPY (EGD) WITH PROPOFOL N/A 09/19/2015   Procedure: ESOPHAGOGASTRODUODENOSCOPY (EGD) WITH PROPOFOL;  Surgeon: Clarene Essex, MD;  Location: [facility] ENDOSCOPY;  Service: Endoscopy;  Laterality: N/A;  . LUNG SURGERY  2008   tumor"infection"  . PR VEIN BYPASS GRAFT,AORTO-FEM-POP    . SAVORY DILATION N/A 09/19/2015   Procedure: SAVORY DILATION;  Surgeon: Clarene Essex, MD;  Location: [facility] ENDOSCOPY;  Service: Endoscopy;  Laterality: N/A;  needs carm  . VASCULAR SURGERY      SOCIAL HISTORY: Social History   Social History  . Marital status: Divorced    Spouse name: N/A  . Number of children: N/A  . Years of education: N/A   Occupational History  . Retired Retired    Charity fundraiser   Social History Main Topics  . Smoking status: Former Smoker    Packs/day: 2.00    Years: 56.00    Types: Cigarettes    Quit date: 03/30/2006  . Smokeless tobacco: Never Used  . Alcohol use 2.4 oz/week    4 Standard drinks or equivalent per week  . Drug use: No  . Sexual activity: Not on file     Other Topics Concern  . Not on file   Social History Narrative  . No narrative on file    FAMILY HISTORY: Family History  Problem Relation Age of Onset  . Heart disease Father   . Diabetes Sister   . Anesthesia problems Neg Hx     ALLERGIES:  is allergic to symbicort [budesonide-formoterol fumarate] and tetanus toxoids.  MEDICATIONS:  Current Outpatient Prescriptions  Medication Sig Dispense Refill  . albuterol (PROAIR HFA) 108 (90 BASE) MCG/ACT inhaler Inhale 2 puffs into the lungs every 6 (six) hours as needed. For shortness of breath 1 Inhaler 2  . albuterol (PROVENTIL) (2.5 MG/3ML) 0.083% nebulizer solution Take 3 mLs (2.5 mg total) by nebulization 4 (four) times daily as needed. For shortness of breath 75 mL 11  . aspirin 81 MG chewable tablet Chew 81 mg by mouth daily. Reported on 10/11/2015    . atorvastatin (LIPITOR) 40 MG tablet Take 40 mg by mouth daily. Reported on 10/11/2015    . cholecalciferol (VITAMIN D) 1000 units tablet Take  1,000 Units by mouth daily.    . clopidogrel (PLAVIX) 75 MG tablet Take 75 mg by mouth daily. Reported on 10/11/2015    . dexamethasone (DECADRON) 4 MG tablet Take 2 tablets (8 mg total) by mouth daily. Start the day after chemotherapy for 2 days. 30 tablet 1  . folic acid (FOLVITE) 1 MG tablet Take 1 mg by mouth daily. Reported on 10/11/2015    . lidocaine-prilocaine (EMLA) cream Apply to affected area once (Patient not taking: Reported on 10/11/2015) 30 g 3  . losartan (COZAAR) 25 MG tablet Take 25 mg by mouth daily.    . metoprolol tartrate (LOPRESSOR) 25 MG tablet Take 25 mg by mouth daily.    . ondansetron (ZOFRAN) 8 MG tablet Take 1 tablet (8 mg total) by mouth 2 (two) times daily as needed for refractory nausea / vomiting. Start on day 3 after chemo. 30 tablet 1  . prochlorperazine (COMPAZINE) 10 MG tablet Take 1 tablet (10 mg total) by mouth every 6 (six) hours as needed (Nausea or vomiting). 30 tablet 1  . vitamin B-12 (CYANOCOBALAMIN)  1000 MCG tablet Take 1,000 mcg by mouth daily.    . Wound Dressings (SONAFINE) Apply 1 application topically 2 (two) times daily.    . Nutritional Supplements (FEEDING SUPPLEMENT, OSMOLITE 1.5 CAL,) LIQD Begin 1/2 can Osmolite 1.5 or equivalent via PEG QID with 120 cc free water before and after. Increase by one can every other day to goal of 6 cans daily (1.5 cans QID) as tolerated. 1422 mL 0   No current facility-administered medications for this visit.     REVIEW OF SYSTEMS:    10 Point review of Systems was done is negative except as noted above.  PHYSICAL EXAMINATION: ECOG PERFORMANCE STATUS:1-2 . Vitals:   10/09/15 1054  BP: (!) 117/49  Pulse: 100  Resp: 20  Temp: 98 F (36.7 C)   Filed Weights   10/09/15 1054  Weight: 149 lb 14.4 oz (68 kg)   .Body mass index is 24.57 kg/m.  GENERAL:alert, in no acute distress and comfortable SKIN: skin color, texture, turgor are normal, no rashes or significant lesions EYES: normal, conjunctiva are pink and non-injected, sclera clear OROPHARYNX:no exudate, no erythema and lips, buccal mucosa, and tongue normal  NECK: supple, no JVD, thyroid normal size, non-tender, without nodularity LYMPH:  no palpable lymphadenopathy in the cervical, axillary or inguinal LUNGS: clear to auscultation with Decreased air entry bilaterally.  HEART: regular rate & rhythm,  no murmurs and no lower extremity edema ABDOMEN: abdomen soft, non-tender, normoactive bowel sounds  Musculoskeletal: no cyanosis of digits and no clubbing  PSYCH: alert & oriented x 3 with fluent speech NEURO: no focal motor/sensory deficits  LABORATORY DATA:  I have reviewed the data as listed  . CBC Latest Ref Rng & Units 10/09/2015  WBC 4.0 - 10.3 10e3/uL 7.9  Hemoglobin 13.0 - 17.1 g/dL 14.1  Hematocrit 38.4 - 49.9 % 42.1  Platelets 140 - 400 10e3/uL 250    . CMP Latest Ref Rng & Units 10/14/2015  Glucose 70 - 140 mg/dl 109(H)  BUNAlma  7.0 - 26.0 mg/dL 18    Creatinine 0.7 - 1.3 mg/dL 1.28(H)  Sodium 136 - 145 mEq/L 146(H)  Potassium 3.5 - 5.1 mEq/L 4.1  Chloride 101 - 111 mmol/L 104  CO2 22 - 29 mEq/L 30  Calcium 8.4 - 10.4 mg/dL 9.9  Total Protein 6.4 - 8.3 g/dL -  Total Bilirubin 0.20 - 1.20 mg/dL -  Alkaline  Phos 40 - 150 U/L -  AST 5 - 34 U/L -  ALT 0 - 55 U/L -     RADIOGRAPHIC STUDIES:   EGD 09/19/2015  - Normal larynx. - Partially obstructing, malignant esophageal tumor was found in the lower third of the   esophagus. Biopsied. - Normal upper third of esophagus and middle third of esophagus. - The examination was otherwise normal. but unable to pass scope past the mass  I have personally reviewed the radiological images as listed and agreed with the findings in the report.  Nm Pet Image Initial (pi) Skull Base To Thigh  Result Date: 09/27/2015 CLINICAL DATA:  Initial Treatment strategy for esophageal mass. EXAM: NUCLEAR MEDICINE PET SKULL BASE TO THIGH TECHNIQUE: 7.8 mCi F-18 FDG was injected intravenously. Full-ring PET imaging was performed from the skull base to thigh after the radiotracer. CT data was obtained and used for attenuation correction and anatomic localization. FASTING BLOOD GLUCOSE:  Value: 109 mg/dl COMPARISON:  01/25/2007 FINDINGS: NECK Accentuated but relatively symmetric lymphoid activity at the tongue base and tonsillar pillars, maximum SUV 8.7. Accentuated activity along the glottis is reasonably symmetric and probably physiologic. There is some high activity in the cervical esophagus which is thought to likely be physiologic. CHEST Dilated esophagus with frothy material extending down to a distal esophageal mass which has maximum standard uptake value of 30.7 and which extends over approximately 6 cm length of the esophagus. In addition, there is a second focus of accentuated esophageal activity in the distal esophagus near the gastroesophageal junction, measuring about 2.5 cm in length, with maximum standard  uptake value 14.9. Coronary, aortic arch, and branch vessel atherosclerotic vascular disease. Prior CABG. Hypermetabolic right hilar lymph node with maximum SUV 5.0. An adjacent tiny right lower paratracheal node adjacent to the right mainstem bronchus has maximum SUV of 4.7. Subcarinal node measures 0.8 cm in short axis on image 76/4 and has a maximum SUV a 4.5. Severe emphysema. There is some scarring in the left lung, likely postoperative. ABDOMEN/PELVIS Gastrohepatic ligament lymph node measuring 1.5 cm in short axis on image 108/4 with maximum standard uptake value 9.9. I do not observe a liver mass. Right adrenal adenoma measures 2.6 by 2.1 cm, density 6 Hounsfield units, minimal metabolic activity. Aortoiliac atherosclerotic vascular disease. Cholelithiasis noted with layering gallstones in the gallbladder. Mildly prominent prostate gland. Postoperative findings along the left groin. SKELETON No focal hypermetabolic activity to suggest skeletal metastasis. IMPRESSION: 1. 6 cm long hypermetabolic region in the distal esophagus compatible with esophageal cancer (max SUV 30.7). There is a second 2.5 cm region of hypermetabolic activity in the distal most esophagus just proximal to the gastroesophageal junction which could represent a second focus of cancer (max SUV 14.9). The there is enlarged gastrohepatic ligament lymph node which is highly hypermetabolic (max SUV 9.9), and faint hypermetabolic activity in several small mediastinal lymph nodes including a right hilar node, a small lymph node anterior to the right mainstem bronchus, and a subcarinal lymph node. 2. Other imaging findings of potential clinical significance: Coronary, aortic arch, and branch vessel atherosclerotic vascular disease. Prior CABG Aortoiliac atherosclerotic vascular disease. Severe emphysema. Scarring in the left upper lung, postoperative. Right adrenal adenoma. Cholelithiasis. Mildly prominent prostate gland. Electronically Signed    By: Van Clines M.D.   On: 09/27/2015 09:42   ASSESSMENT & PLAN:   82 year old [place] male with multiple medical comorbidities including significant coronary artery disease status post bypass and ischemic cardiomyopathy, COPD which is oxygen dependent  at 3 L/min, peripheral vascular disease status post stenting of his lower extremity arteries on multiple antiplatelet therapies now with.  #1 Newly diagnosed Stage III multifocal lower third of the esophagus invasive squamous cell carcinoma with an enlarged gastrohepatic ligament lymph node and faint hypermetabolic several small mediastinal lymph nodes including a right hilar lymph node and a subcarinal lymph node. No overt distal metastatic disease noted.   His previous heavy alcohol use and smoking are the likely etiology. He notes that he has quit heavy smoking and drinks only 2-3 beers a week since his cardiac bypass surgery in 2009. Patient has an ECOG performance status of 2 Plan -PET/CT scan staging discussed with the patient . -He was recommended, current chemoradiation with potentially curative intent . -He will be starting his radiation therapy today and we shall start his chemotherapy from next week after his dehydration and fluid status is corrected . -We intend to start weekly carboplatin and Taxol while on radiation therapy. -After discussion pros and cons of different nutritional therapies the patient is agreeable and was given a referral to IR for G-tube placement . Plavix to be held for 5 days . -Patient will continue to follow up with Ernestene Kiel for G-tube feeding . -He will also have a port placement for chemotherapy administration and IV fluids.  #2 CAD status post CABG, ischemic cardiomyopathy ejection fraction 25% in 2009. -Repeat echocardiogram ordered to check ejection fraction for fluid management considerations .  #3 peripheral vascular disease. Has stents in his lower extremity. Currently on aspirin plus  Plavix plus cilostazol. - Plavix on hold till G-tube placement .  #4 ex-smoker more than 100-pack-year history of smoking. #5 Ex heavy alcohol use #6 COPD with chronic hypoxic respiratory failure on 3 L/min oxygen by nasal cannula. #7 dehydration and acute kidney injury - resolving but the patient still has some hypernatremia and bump in creatinine. -We shall give him some additional IV normal saline about a liter today in the setting of poor oral intake and dehydration. -Would prefer to give him fluids through his G-tube once placed.  All of the patients questions were answered with apparent satisfaction. The patient knows to call the clinic with any problems, questions or concerns.  I spent 40 minutes counseling the patient face to face. The total time spent in the appointment was 40 minutes and more than 50% was on counseling and direct patient cares.    Sullivan Lone MD [place] AAHIVMS [facility] Hematology/Oncology Physician [facility]  (Office):       [phone] (Work cell):  [phone] (Fax):           [phone]  10/22/2015 9:39 AM

## 2015-10-23 ENCOUNTER — Ambulatory Visit (HOSPITAL_BASED_OUTPATIENT_CLINIC_OR_DEPARTMENT_OTHER): Payer: Medicare Other | Admitting: Hematology

## 2015-10-23 ENCOUNTER — Other Ambulatory Visit: Payer: Medicare Other

## 2015-10-23 ENCOUNTER — Ambulatory Visit
Admission: RE | Admit: 2015-10-23 | Discharge: 2015-10-23 | Disposition: A | Discharge status: Home / Self Care | Payer: Medicare Other | Source: Ambulatory Visit | Attending: Radiation Oncology | Admitting: Radiation Oncology

## 2015-10-23 ENCOUNTER — Ambulatory Visit (HOSPITAL_BASED_OUTPATIENT_CLINIC_OR_DEPARTMENT_OTHER): Payer: Medicare Other

## 2015-10-23 ENCOUNTER — Other Ambulatory Visit: Payer: Self-pay | Admitting: Hematology

## 2015-10-23 ENCOUNTER — Ambulatory Visit: Payer: Medicare Other | Admitting: Nutrition

## 2015-10-23 VITALS — BP 121/54 | HR 81 | Temp 97.8°F | Resp 20

## 2015-10-23 DIAGNOSIS — C155 Malignant neoplasm of lower third of esophagus: Secondary | ICD-10-CM

## 2015-10-23 DIAGNOSIS — Z5111 Encounter for antineoplastic chemotherapy: Secondary | ICD-10-CM

## 2015-10-23 DIAGNOSIS — C159 Malignant neoplasm of esophagus, unspecified: Secondary | ICD-10-CM

## 2015-10-23 DIAGNOSIS — Z510 Encounter for antineoplastic radiation therapy: Secondary | ICD-10-CM | POA: Diagnosis not present

## 2015-10-23 DIAGNOSIS — E43 Unspecified severe protein-calorie malnutrition: Secondary | ICD-10-CM | POA: Diagnosis not present

## 2015-10-23 DIAGNOSIS — J9611 Chronic respiratory failure with hypoxia: Secondary | ICD-10-CM

## 2015-10-23 DIAGNOSIS — I2510 Atherosclerotic heart disease of native coronary artery without angina pectoris: Secondary | ICD-10-CM | POA: Diagnosis not present

## 2015-10-23 DIAGNOSIS — I255 Ischemic cardiomyopathy: Secondary | ICD-10-CM

## 2015-10-23 DIAGNOSIS — I739 Peripheral vascular disease, unspecified: Secondary | ICD-10-CM

## 2015-10-23 MED ORDER — PALONOSETRON HCL INJECTION 0.25 MG/5ML
0.2500 mg | Freq: Once | INTRAVENOUS | Status: AC
  Administered 2015-10-23: 0.25 mg via INTRAVENOUS

## 2015-10-23 MED ORDER — SODIUM CHLORIDE 0.9 % IV SOLN
Freq: Once | INTRAVENOUS | Status: AC
  Administered 2015-10-23: 12:00:00 via INTRAVENOUS

## 2015-10-23 MED ORDER — FAMOTIDINE IN NACL 20-0.9 MG/50ML-% IV SOLN
20.0000 mg | Freq: Once | INTRAVENOUS | Status: AC
  Administered 2015-10-23: 20 mg via INTRAVENOUS

## 2015-10-23 MED ORDER — FAMOTIDINE IN NACL 20-0.9 MG/50ML-% IV SOLN
INTRAVENOUS | Status: AC
  Filled 2015-10-23: qty 50

## 2015-10-23 MED ORDER — PALONOSETRON HCL INJECTION 0.25 MG/5ML
INTRAVENOUS | Status: AC
  Filled 2015-10-23: qty 5

## 2015-10-23 MED ORDER — HEPARIN SOD (PORK) LOCK FLUSH 100 UNIT/ML IV SOLN
500.0000 [IU] | Freq: Once | INTRAVENOUS | Status: AC | PRN
  Administered 2015-10-23: 500 [IU]
  Filled 2015-10-23: qty 5

## 2015-10-23 MED ORDER — METOCLOPRAMIDE HCL 5 MG PO TABS: 5.0000 mg | ORAL_TABLET | Freq: Three times a day (TID) | ORAL | 1 refills | Status: DC

## 2015-10-23 MED ORDER — DIPHENHYDRAMINE HCL 50 MG/ML IJ SOLN
50.0000 mg | Freq: Once | INTRAMUSCULAR | Status: AC
  Administered 2015-10-23: 50 mg via INTRAVENOUS

## 2015-10-23 MED ORDER — SODIUM CHLORIDE 0.9 % IV SOLN
50.0000 mg/m2 | Freq: Once | INTRAVENOUS | Status: AC
  Administered 2015-10-23: 90 mg via INTRAVENOUS
  Filled 2015-10-23: qty 15

## 2015-10-23 MED ORDER — SODIUM CHLORIDE 0.9 % IV SOLN
155.8000 mg | Freq: Once | INTRAVENOUS | Status: AC
  Administered 2015-10-23: 160 mg via INTRAVENOUS
  Filled 2015-10-23: qty 16

## 2015-10-23 MED ORDER — SODIUM CHLORIDE 0.9 % IV SOLN
20.0000 mg | Freq: Once | INTRAVENOUS | Status: AC
  Administered 2015-10-23: 20 mg via INTRAVENOUS
  Filled 2015-10-23: qty 2

## 2015-10-23 MED ORDER — DIPHENHYDRAMINE HCL 50 MG/ML IJ SOLN
INTRAMUSCULAR | Status: AC
  Filled 2015-10-23: qty 1

## 2015-10-23 MED ORDER — SODIUM CHLORIDE 0.9% FLUSH
10.0000 mL | INTRAVENOUS | Status: DC | PRN
  Administered 2015-10-23: 10 mL
  Filled 2015-10-23: qty 10

## 2015-10-23 NOTE — Patient Instructions (Signed)
[facility] Discharge Instructions for Patients Receiving Chemotherapy  Today you received the following chemotherapy agents Taxol/Carboplatin  To help prevent nausea and vomiting after your treatment, we encourage you to take your nausea medication    If you develop nausea and vomiting that is not controlled by your nausea medication, call the clinic.   BELOW ARE SYMPTOMS THAT SHOULD BE REPORTED IMMEDIATELY:  *FEVER GREATER THAN 100.5 F  *CHILLS WITH OR WITHOUT FEVER  NAUSEA AND VOMITING THAT IS NOT CONTROLLED WITH YOUR NAUSEA MEDICATION  *UNUSUAL SHORTNESS OF BREATH  *UNUSUAL BRUISING OR BLEEDING  TENDERNESS IN MOUTH AND THROAT WITH OR WITHOUT PRESENCE OF ULCERS  *URINARY PROBLEMS  *BOWEL PROBLEMS  UNUSUAL RASH Items with * indicate a potential emergency and should be followed up as soon as possible.  Feel free to call the clinic you have any questions or concerns. The clinic phone number is [phone].  Please show the CHEMO ALERT CARD at check-in to the Emergency Department and triage nurse.   

## 2015-10-23 NOTE — Progress Notes (Signed)
Nutrition follow-up completed with patient and son who is receiving radiation therapy for esophageal cancer. Patient reports he is only tolerating approximately 2 cans Osmolite 1.5 daily via PEG. Reports he feels too full. Has been able to tolerate soda and juice by mouth without spitting up Continues to feel weak and fatigued. Reports constipation but is not taking medication.  Labs reviewed and noted as follows: Phosphorus 3.0, magnesium 1.7, potassium 4.8, glucose 111, and albumin 3.3.  No evidence of refeeding syndrome at this time.  Estimated nutrition needs: 1900-2100 calories, 82-95 grams protein, 2 L fluid.  Intervention: Recommended patient continue to work to increase Osmolite 1.5 as tolerated.  Patient understands goal rate of formula is 1-1/2 cans 4 times a day with 120 cc free water before and after bolus feeding. Consider Reglan to help with feelings of fullness. Tube feeding at goal rate provides 2130 cal, 89 g protein, 2046 mL free water meeting greater than 90% of estimated nutrition needs. Explained the role of constipation and feeling full and encouraged patient to take medication as recommended by physician. Questions were answered.  Teach back method used.  Monitoring, evaluation, goals:  Patient will tolerate tube feeding advancement to goal rate to meet greater than 90% of estimated needs and avoid refeeding syndrome  Next visit: Wednesday, August 2.  **Disclaimer: This note was dictated with voice recognition software. Similar sounding words can inadvertently be transcribed and this note may contain transcription errors which may not have been corrected upon publication of note.**

## 2015-10-23 NOTE — Progress Notes (Signed)
[name] Kitchen    HEMATOLOGY/ONCOLOGY CLINIC NOTE  Date of Service: 10/23/2015  Patient Care Team: [name], MD as PCP - General (Family Medicine) Kathee Delton, MD (Inactive) (Pulmonary Disease) Adrian Prows, MD as Attending Physician (Cardiology)  CHIEF COMPLAINTS/PURPOSE OF CONSULTATION:  Newly diagnosed Esophageal squamous cell carcinoma  INTERVAL HISTORY  [name] is here for his scheduled follow-up  All prior to his second cycle of carboplatin and Taxol concurrent with chemotherapy. He feels like liquids are a little easier to swallow the we talked about the fact that he should avoid drinking large amounts of these orally to reduce the risk of aspiration at this time. He has been getting tube feeding. He notes that he feels rather full after the tube feeding and somewhat nauseous. We discussed the use of pre-feeding Reglan and a prescription was sent. He notes some grade 1 fatigue likely related to his chemotherapy and radiation.   No other acute new symptoms.   MEDICAL HISTORY:  Past Medical History:  Diagnosis Date  . Anemia   . CAD (coronary artery disease)    Dr Einar Gip every 6 months  . Cardiomyopathy    EF 25% by echo 2009  . Carotid artery occlusion   . COPD (chronic obstructive pulmonary disease) ([place])    on O2 constantly  . DVT (deep venous thrombosis) ([place])   . Emphysema   . Esophageal cancer ([place])    'causes chest discomfort, liquids and solids"  . History of oxygen administration    Oxygen 3 l/m at rest / 4 l/m when active. 24/7  . Hypertension   . Keeps losing balance   . Myocardial infarction ([place]) 1986  . Peripheral vascular disease ([place])   . Pulmonary nodule    s/p wedge resction, inflammatory, +MAC  . Shortness of breath   . Vitamin B 12 deficiency     SURGICAL HISTORY: Past Surgical History:  Procedure Laterality Date  . ARCH AORTOGRAM N/A 07/21/2011   Procedure: ARCH AORTOGRAM;  Surgeon: Laverda Page, MD;  Location: [facility] CATH LAB;  Service:  Cardiovascular;  Laterality: N/A;  . CAROTID ENDARTERECTOMY  2009   triple bypass  . CAROTID-VERTEBRAL BYPASS GRAFT  08/20/2011   Procedure: BYPASS GRAFT CAROTID-VERTEBRAL;  Surgeon: Serafina Mitchell, MD;  Location: [facility] OR;  Service: Vascular;  Laterality: Right;  Right CAROTID-VERTEBRAL TRANSPOSITION  . CORONARY ARTERY BYPASS GRAFT  2009  . ESOPHAGOGASTRODUODENOSCOPY (EGD) WITH PROPOFOL N/A 09/19/2015   Procedure: ESOPHAGOGASTRODUODENOSCOPY (EGD) WITH PROPOFOL;  Surgeon: Clarene Essex, MD;  Location: [facility] ENDOSCOPY;  Service: Endoscopy;  Laterality: N/A;  . LUNG SURGERY  2008   tumor"infection"  . PR VEIN BYPASS GRAFT,AORTO-FEM-POP    . SAVORY DILATION N/A 09/19/2015   Procedure: SAVORY DILATION;  Surgeon: Clarene Essex, MD;  Location: [facility] ENDOSCOPY;  Service: Endoscopy;  Laterality: N/A;  needs carm  . VASCULAR SURGERY      SOCIAL HISTORY: Social History   Social History  . Marital status: Divorced    Spouse name: N/A  . Number of children: N/A  . Years of education: N/A   Occupational History  . Retired Retired    Charity fundraiser   Social History Main Topics  . Smoking status: Former Smoker    Packs/day: 2.00    Years: 56.00    Types: Cigarettes    Quit date: 03/30/2006  . Smokeless tobacco: Never Used  . Alcohol use 2.4 oz/week    4 Standard drinks or equivalent per week  . Drug use: No  .  Sexual activity: Not on file   Other Topics Concern  . Not on file   Social History Narrative  . No narrative on file    FAMILY HISTORY: Family History  Problem Relation Age of Onset  . Heart disease Father   . Diabetes Sister   . Anesthesia problems Neg Hx     ALLERGIES:  is allergic to symbicort [budesonide-formoterol fumarate] and tetanus toxoids.  MEDICATIONS:  Current Outpatient Prescriptions  Medication Sig Dispense Refill  . albuterol (PROAIR HFA) 108 (90 BASE) MCG/ACT inhaler Inhale 2 puffs into the lungs every 6 (six) hours as needed. For shortness of breath 1 Inhaler 2  .  albuterol (PROVENTIL) (2.5 MG/3ML) 0.083% nebulizer solution Take 3 mLs (2.5 mg total) by nebulization 4 (four) times daily as needed. For shortness of breath 75 mL 11  . aspirin 81 MG chewable tablet Chew 81 mg by mouth daily. Reported on 10/11/2015    . atorvastatin (LIPITOR) 40 MG tablet Take 40 mg by mouth daily. Reported on 10/11/2015    . cholecalciferol (VITAMIN D) 1000 units tablet Take 1,000 Units by mouth daily.    . clopidogrel (PLAVIX) 75 MG tablet Take 75 mg by mouth daily. Reported on 10/11/2015    . dexamethasone (DECADRON) 4 MG tablet Take 2 tablets (8 mg total) by mouth daily. Start the day after chemotherapy for 2 days. 30 tablet 1  . folic acid (FOLVITE) 1 MG tablet Take 1 mg by mouth daily. Reported on 10/11/2015    . lidocaine-prilocaine (EMLA) cream Apply to affected area once (Patient not taking: Reported on 10/11/2015) 30 g 3  . losartan (COZAAR) 25 MG tablet Take 25 mg by mouth daily.    . metoCLOPramide (REGLAN) 5 MG tablet Take 1 tablet (5 mg total) by mouth 3 (three) times daily before meals. 90 tablet 1  . metoprolol tartrate (LOPRESSOR) 25 MG tablet Take 25 mg by mouth daily.    . Nutritional Supplements (FEEDING SUPPLEMENT, OSMOLITE 1.5 CAL,) LIQD Begin 1/2 can Osmolite 1.5 or equivalent via PEG QID with 120 cc free water before and after. Increase by one can every other day to goal of 6 cans daily (1.5 cans QID) as tolerated. 1422 mL 0  . ondansetron (ZOFRAN) 8 MG tablet Take 1 tablet (8 mg total) by mouth 2 (two) times daily as needed for refractory nausea / vomiting. Start on day 3 after chemo. 30 tablet 1  . prochlorperazine (COMPAZINE) 10 MG tablet Take 1 tablet (10 mg total) by mouth every 6 (six) hours as needed (Nausea or vomiting). 30 tablet 1  . vitamin B-12 (CYANOCOBALAMIN) 1000 MCG tablet Take 1,000 mcg by mouth daily.    . Wound Dressings (SONAFINE) Apply 1 application topically 2 (two) times daily.     No current facility-administered medications for this  visit.    Facility-Administered Medications Ordered in Other Visits  Medication Dose Route Frequency Provider Last Rate Last Dose  . CARBOplatin (PARAPLATIN) 160 mg in sodium chloride 0.9 % 100 mL chemo infusion  160 mg Intravenous Once Brunetta Genera, MD   160 mg at 10/23/15 1331  . heparin lock flush 100 unit/mL  500 Units Intracatheter Once PRN Brunetta Genera, MD      . sodium chloride flush (NS) 0.9 % injection 10 mL  10 mL Intracatheter PRN [place], MD        REVIEW OF SYSTEMS:    10 Point review of Systems was done is negative except as noted  above.  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  HEART: regular rate & rhythm,  no murmurs and no lower extremity edema ABDOMEN: abdomen soft, non-tender, normoactive bowel sounds  Musculoskeletal: no cyanosis of digits and no clubbing  PSYCH: alert & oriented x 3 with fluent speech NEURO: no focal motor/sensory deficits  LABORATORY DATA:  I have reviewed the data as listed  . CBC Latest Ref Rng & Units 10/22/2015 10/15/2015 10/14/2015  WBC 4.0 - 10.3 10e3/uL 5.3 8.8 9.2  Hemoglobin 13.0 - 17.1 g/dL 13.3 13.5 13.8  Hematocrit 38.4 - 49.9 % 39.0 40.0 41.7  Platelets 140 - 400 10e3/uL 194 243 271    . CMP Latest Ref Rng & Units 10/22/2015 10/18/2015 10/15/2015  Glucose 70 - 140 mg/dl 111 134 92  BUN 7.0 - 26.0 mg/dL 20.9 27.3(H) 22.3  Creatinine 0.7 - 1.3 mg/dL 1.1 1.0 1.3  Sodium  136 - 145 mEq/L 142 143 144  Potassium 3.5 - 5.1 mEq/L 4.8 4.6 4.1  Chloride 101 - 111 mmol/L - - -  CO2 22 - 29 mEq/L 31(H) 29 28  Calcium 8.4 - 10.4 mg/dL 10.0 9.7 9.8  Total Protein 6.4 - 8.3 g/dL 6.7 7.1 7.3  Total Bilirubin 0.20 - 1.20 mg/dL 0.95 0.82 1.17  Alkaline Phos 40 - 150 U/L 54 57 61  AST 5 - 34 U/L 14 17 14   ALT 0 - 55 U/L 9 <9 <9    RADIOGRAPHIC STUDIES:   EGD 09/19/2015  - Normal larynx. - Partially obstructing, malignant esophageal tumor was found in the lower third of the   esophagus. Biopsied. - Normal upper third of esophagus and middle third of esophagus. - The examination was otherwise normal. but unable to pass scope past the mass  I have personally reviewed the radiological images as listed and agreed with the findings in the report.  Nm Pet Image Initial (pi) Skull Base To Thigh  Result Date: 09/27/2015 CLINICAL DATA:  Initial Treatment strategy for esophageal mass. EXAM: NUCLEAR MEDICINE PET SKULL BASE TO THIGH TECHNIQUE: 7.8 mCi F-18 FDG was injected intravenously. Full-ring PET imaging was performed from the skull base to thigh after the radiotracer. CT data was obtained and used for attenuation correction and anatomic localization. FASTING BLOOD GLUCOSE:  Value: 109 mg/dl COMPARISON:  01/25/2007 FINDINGS: NECK Accentuated but relatively symmetric lymphoid activity at the tongue base and tonsillar pillars, maximum SUV 8.7. Accentuated activity along the glottis is reasonably symmetric and probably physiologic. There is some high activity in the cervical esophagus which is thought to likely be physiologic. CHEST Dilated esophagus with frothy material extending down to a distal esophageal mass which has maximum standard uptake value of 30.7 and which extends over approximately 6 cm length of the esophagus. In addition, there is a second focus of accentuated esophageal activity in the distal esophagus near the gastroesophageal junction, measuring about 2.5 cm in  length, with maximum standard uptake value 14.9. Coronary, aortic arch, and branch vessel atherosclerotic vascular disease. Prior CABG. Hypermetabolic right hilar lymph node with maximum  SUV 5.0. An adjacent tiny right lower paratracheal node adjacent to the right mainstem bronchus has maximum SUV of 4.7. Subcarinal node measures 0.8 cm in short axis on image 76/4 and has a maximum SUV a 4.5. Severe emphysema. There is some scarring in the left lung, likely postoperative. ABDOMEN/PELVIS Gastrohepatic ligament lymph node measuring 1.5 cm in short axis on image 108/4 with maximum standard uptake value 9.9. I do not observe a liver mass. Right adrenal adenoma measures 2.6 by 2.1 cm, density 6 Hounsfield units, minimal metabolic activity. Aortoiliac atherosclerotic vascular disease. Cholelithiasis noted with layering gallstones in the gallbladder. Mildly prominent prostate gland. Postoperative findings along the left groin. SKELETON No focal hypermetabolic activity to suggest skeletal metastasis. IMPRESSION: 1. 6 cm long hypermetabolic region in the distal esophagus compatible with esophageal cancer (max SUV 30.7). There is a second 2.5 cm region of hypermetabolic activity in the distal most esophagus just proximal to the gastroesophageal junction which could represent a second focus of cancer (max SUV 14.9). The there is enlarged gastrohepatic ligament lymph node which is highly hypermetabolic (max SUV 9.9), and faint hypermetabolic activity in several small mediastinal lymph nodes including a right hilar node, a small lymph node anterior to the right mainstem bronchus, and a subcarinal lymph node. 2. Other imaging findings of potential clinical significance: Coronary, aortic arch, and branch vessel atherosclerotic vascular disease. Prior CABG Aortoiliac atherosclerotic vascular disease. Severe emphysema. Scarring in the left upper lung, postoperative. Right adrenal adenoma. Cholelithiasis. Mildly prominent prostate  gland. Electronically Signed   By: Van Clines M.D.   On: 09/27/2015 09:42   ASSESSMENT & PLAN:   82 year old [place] male with multiple medical comorbidities including significant coronary artery disease status post bypass and ischemic cardiomyopathy, COPD which is oxygen dependent at 3 L/min, peripheral vascular disease status post stenting of his lower extremity arteries on multiple antiplatelet therapies now with.  #1 Stage III multifocal lower third of the esophagus invasive squamous cell carcinoma with an enlarged gastrohepatic ligament lymph node and faint hypermetabolic several small mediastinal lymph nodes including a right hilar lymph node and a subcarinal lymph node. No overt distal metastatic disease noted.   His previous heavy alcohol use and smoking are the likely etiology. He notes that he has quit heavy smoking and drinks only 2-3 beers a week since his cardiac bypass surgery in 2009. Patient has an ECOG performance status of 2  No prohibitive toxicities other than some grade 1-2 fatigue. Labs today are stable.  Plan -Patient okay to proceed with second cycle of carboplatin and Taxol chemotherapy concurrent with ongoing radiation.  #2 severe protein calorie malnutrition tolerating tube feeding but has symptoms of excessive gastric fullness. Likely some element of gastroparesis. Weight has tablet stabilized. Plan -Continue tube feeding as per recommendations of Ernestene Kiel. -Ordered Reglan 5 mg pre-meals to help with gastroparetic symptoms.  #3 CAD status post CABG, ischemic cardiomyopathy ejection fraction 25% in 2009. -Repeat echocardiogram shows improvement in ejection fraction 35-40%. Patient also noted to have severe calcific aortic stenosis with moderate to severe aortic stenosis and moderate mitral regurgitation. Plan -Patient follows with Dr. Einar Gip for his cardiology needs.  #3 peripheral vascular disease. Has stents in his lower extremity. Currently on  aspirin plus Plavix  t#4 ex-smoker more than 100-pack-year history of smoking.  #5 Ex heavy alcohol use  #6 COPD with chronic hypoxic respiratory failure on 3 L/min oxygen by nasal cannula. Plan -Continue follow-up with primary care physician for ongoing management of his multiple medical  comorbidities.   RTC with Dr. Irene Limbo in 1 week with repeat labs prior to third cycle of carbo/taxol.  All of the patients questions were answered with apparent satisfaction. The patient knows to call the clinic with any problems, questions or concerns.  I spent 15 minutes counseling the patient face to face. The total time spent in the appointment was 20 minutes and more than 50% was on counseling and direct patient cares.    Sullivan Lone MD [place] AAHIVMS [facility] Hematology/Oncology Physician [facility]  (Office):       [phone] (Work cell):  [phone] (Fax):           [phone]

## 2015-10-24 ENCOUNTER — Ambulatory Visit
Admission: RE | Admit: 2015-10-24 | Discharge: 2015-10-24 | Disposition: A | Discharge status: Home / Self Care | Payer: Medicare Other | Source: Ambulatory Visit | Attending: Radiation Oncology | Admitting: Radiation Oncology

## 2015-10-24 DIAGNOSIS — Z510 Encounter for antineoplastic radiation therapy: Secondary | ICD-10-CM | POA: Diagnosis not present

## 2015-10-25 ENCOUNTER — Ambulatory Visit
Admission: RE | Admit: 2015-10-25 | Discharge: 2015-10-25 | Disposition: A | Discharge status: Home / Self Care | Payer: Medicare Other | Source: Ambulatory Visit | Attending: Radiation Oncology | Admitting: Radiation Oncology

## 2015-10-25 ENCOUNTER — Encounter: Payer: Self-pay | Admitting: Radiation Oncology

## 2015-10-25 VITALS — BP 107/62 | HR 83 | Temp 97.7°F | Ht 65.5 in | Wt 146.8 lb

## 2015-10-25 DIAGNOSIS — C155 Malignant neoplasm of lower third of esophagus: Secondary | ICD-10-CM

## 2015-10-25 DIAGNOSIS — Z510 Encounter for antineoplastic radiation therapy: Secondary | ICD-10-CM | POA: Diagnosis not present

## 2015-10-25 NOTE — Progress Notes (Signed)
Department of Radiation Oncology  Phone:  [phone] Fax:        [phone]  Weekly Treatment Note    Name: [name] Date: 10/25/2015 MRN: [id number] DOB: [birth year]   Diagnosis:     ICD-9-CM ICD-10-CM   1. Cancer of lower third of esophagus (HCC) 150.5 C15.5      Current dose: 24 Gy  Current fraction: 12   MEDICATIONS: Current Outpatient Prescriptions  Medication Sig Dispense Refill  . albuterol (PROAIR HFA) 108 (90 BASE) MCG/ACT inhaler Inhale 2 puffs into the lungs every 6 (six) hours as needed. For shortness of breath 1 Inhaler 2  . albuterol (PROVENTIL) (2.5 MG/3ML) 0.083% nebulizer solution Take 3 mLs (2.5 mg total) by nebulization 4 (four) times daily as needed. For shortness of breath 75 mL 11  . aspirin 81 MG chewable tablet Chew 81 mg by mouth daily. Reported on 10/11/2015    . atorvastatin (LIPITOR) 40 MG tablet Take 40 mg by mouth daily. Reported on 10/11/2015    . cholecalciferol (VITAMIN D) 1000 units tablet Take 1,000 Units by mouth daily.    [name] dexamethasone (DECADRON) 4 MG tablet Take 2 tablets (8 mg total) by mouth daily. Start the day after chemotherapy for 2 days. 30 tablet 1  . folic acid (FOLVITE) 1 MG tablet Take 1 mg by mouth daily. Reported on 10/11/2015    . losartan (COZAAR) 25 MG tablet Take 25 mg by mouth daily.    . metoCLOPramide (REGLAN) 5 MG tablet Take 1 tablet (5 mg total) by mouth 3 (three) times daily before meals. 90 tablet 1  . metoprolol tartrate (LOPRESSOR) 25 MG tablet Take 25 mg by mouth daily.    . Nutritional Supplements (FEEDING SUPPLEMENT, OSMOLITE 1.5 CAL,) LIQD Begin 1/2 can Osmolite 1.5 or equivalent via PEG QID with 120 cc free water before and after. Increase by one can every other day to goal of 6 cans daily (1.5 cans QID) as tolerated. 1422 mL 0  . ondansetron (ZOFRAN) 8 MG tablet Take 1 tablet (8 mg total) by mouth 2 (two) times daily as needed for refractory nausea / vomiting. Start on day 3 after chemo. 30 tablet 1    . prochlorperazine (COMPAZINE) 10 MG tablet Take 1 tablet (10 mg total) by mouth every 6 (six) hours as needed (Nausea or vomiting). 30 tablet 1  . vitamin B-12 (CYANOCOBALAMIN) 1000 MCG tablet Take 1,000 mcg by mouth daily.    . Wound Dressings (SONAFINE) Apply 1 application topically 2 (two) times daily.    . clopidogrel (PLAVIX) 75 MG tablet Take 75 mg by mouth daily. Reported on 10/11/2015    . lidocaine-prilocaine (EMLA) cream Apply to affected area once (Patient not taking: Reported on 10/11/2015) 30 g 3   No current facility-administered medications for this encounter.      ALLERGIES: Symbicort [budesonide-formoterol fumarate] and Tetanus toxoids   LABORATORY DATA:  Lab Results  Component Value Date   WBC 5.3 10/22/2015   HGB 13.3 10/22/2015   HCT 39.0 10/22/2015   MCV 85.2 10/22/2015   PLT 194 10/22/2015   Lab Results  Component Value Date   NA 142 10/22/2015   K 4.8 10/22/2015   CL 104 10/14/2015   CO2 31 (H) 10/22/2015   Lab Results  Component Value Date   ALT 9 10/22/2015   AST 14 10/22/2015   ALKPHOS 54 10/22/2015   BILITOT 0.95 10/22/2015     NARRATIVE: [name] was seen today for weekly treatment management.  The chart was checked and the patient's films were reviewed.  [name] has received 12 fractions to his esophagus. Osmolite vis PEG tube. And he states he is trying to eat foods such as pizza, seafood, etc... He reports fatigue.  Travel by W/C.  He reprots that he does not have any skin changes from XRT. No other voiced concerns  6:37 PM BP 107/62 (BP Location: Left Arm, Patient Position: Sitting, Cuff Size: Normal)   Pulse 83   Temp 97.7 F (36.5 C)   Ht 5' 5.5" (1.664 m)   Wt 146 lb 12.8 oz (66.6 kg)   BMI 24.06 kg/m   Wt Readings from Last 3 Encounters:  10/25/15 146 lb 12.8 oz (66.6 kg)  10/18/15 148 lb (67.1 kg)  10/18/15 148 lb (67.1 kg)   6:37 PM   PHYSICAL EXAMINATION: height is 5' 5.5" (1.664 m) and weight is 146 lb 12.8 oz  (66.6 kg). His temperature is 97.7 F (36.5 C). His blood pressure is 107/62 and his pulse is 83.        ASSESSMENT: The patient is doing satisfactorily with treatment.  PLAN: We will continue with the patient's radiation treatment as planned.    ------------------------------------------------   Tyler Pita, MD [place] Director and Director of Stereotactic Radiosurgery Direct Dial: [phone]  Fax: [phone] [email or web address]  Skype  [email or web address]   This document serves as a record of services personally performed by Tyler Pita, MD. It was created on his behalf by Truddie Hidden, a trained medical scribe. The creation of this record is based on the scribe's personal observations and the provider's statements to them. This document has been checked and approved by the attending provider.

## 2015-10-25 NOTE — Progress Notes (Signed)
Mr. Ghattas has received 12 fractions to his esophagus. Osmolite vis PEG tube. And he states he is trying to eat foods such as pizza, seafood, etc... He reports fatigue.  Travel by W/C.  He reprots that he does not have any skin changes from XRT. No other voiced concerns

## 2015-10-28 ENCOUNTER — Ambulatory Visit
Admission: RE | Admit: 2015-10-28 | Discharge: 2015-10-28 | Disposition: A | Discharge status: Home / Self Care | Payer: Medicare Other | Source: Ambulatory Visit | Attending: Radiation Oncology | Admitting: Radiation Oncology

## 2015-10-28 DIAGNOSIS — Z510 Encounter for antineoplastic radiation therapy: Secondary | ICD-10-CM | POA: Diagnosis not present

## 2015-10-29 ENCOUNTER — Ambulatory Visit
Admission: RE | Admit: 2015-10-29 | Discharge: 2015-10-29 | Disposition: A | Discharge status: Home / Self Care | Payer: Medicare Other | Source: Ambulatory Visit | Attending: Radiation Oncology | Admitting: Radiation Oncology

## 2015-10-29 DIAGNOSIS — Z510 Encounter for antineoplastic radiation therapy: Secondary | ICD-10-CM | POA: Diagnosis not present

## 2015-10-30 ENCOUNTER — Ambulatory Visit (HOSPITAL_BASED_OUTPATIENT_CLINIC_OR_DEPARTMENT_OTHER): Payer: Medicare Other

## 2015-10-30 ENCOUNTER — Ambulatory Visit (HOSPITAL_BASED_OUTPATIENT_CLINIC_OR_DEPARTMENT_OTHER): Payer: Medicare Other | Admitting: Hematology

## 2015-10-30 ENCOUNTER — Other Ambulatory Visit: Payer: Self-pay | Admitting: *Deleted

## 2015-10-30 ENCOUNTER — Telehealth: Payer: Self-pay | Admitting: Hematology

## 2015-10-30 ENCOUNTER — Encounter: Payer: Self-pay | Admitting: Hematology

## 2015-10-30 ENCOUNTER — Ambulatory Visit
Admission: RE | Admit: 2015-10-30 | Discharge: 2015-10-30 | Disposition: A | Discharge status: Home / Self Care | Payer: Medicare Other | Source: Ambulatory Visit | Attending: Radiation Oncology | Admitting: Radiation Oncology

## 2015-10-30 ENCOUNTER — Other Ambulatory Visit (HOSPITAL_BASED_OUTPATIENT_CLINIC_OR_DEPARTMENT_OTHER): Payer: Medicare Other

## 2015-10-30 ENCOUNTER — Ambulatory Visit: Payer: Medicare Other | Admitting: Nutrition

## 2015-10-30 VITALS — BP 102/69 | HR 71 | Temp 98.1°F | Resp 21 | Wt 143.5 lb

## 2015-10-30 DIAGNOSIS — C155 Malignant neoplasm of lower third of esophagus: Secondary | ICD-10-CM

## 2015-10-30 DIAGNOSIS — K5901 Slow transit constipation: Secondary | ICD-10-CM

## 2015-10-30 DIAGNOSIS — C159 Malignant neoplasm of esophagus, unspecified: Secondary | ICD-10-CM

## 2015-10-30 DIAGNOSIS — I2510 Atherosclerotic heart disease of native coronary artery without angina pectoris: Secondary | ICD-10-CM | POA: Diagnosis not present

## 2015-10-30 DIAGNOSIS — F1010 Alcohol abuse, uncomplicated: Secondary | ICD-10-CM

## 2015-10-30 DIAGNOSIS — I255 Ischemic cardiomyopathy: Secondary | ICD-10-CM | POA: Diagnosis not present

## 2015-10-30 DIAGNOSIS — Z510 Encounter for antineoplastic radiation therapy: Secondary | ICD-10-CM | POA: Diagnosis not present

## 2015-10-30 DIAGNOSIS — E43 Unspecified severe protein-calorie malnutrition: Secondary | ICD-10-CM | POA: Diagnosis not present

## 2015-10-30 DIAGNOSIS — J9611 Chronic respiratory failure with hypoxia: Secondary | ICD-10-CM

## 2015-10-30 DIAGNOSIS — Z5111 Encounter for antineoplastic chemotherapy: Secondary | ICD-10-CM | POA: Diagnosis not present

## 2015-10-30 DIAGNOSIS — K2960 Other gastritis without bleeding: Secondary | ICD-10-CM

## 2015-10-30 LAB — COMPREHENSIVE METABOLIC PANEL
ALBUMIN: 3.3 g/dL — AB (ref 3.5–5.0)
ALK PHOS: 54 U/L (ref 40–150)
ALT: 12 U/L (ref 0–55)
AST: 16 U/L (ref 5–34)
Anion Gap: 10 mEq/L (ref 3–11)
BILIRUBIN TOTAL: 0.99 mg/dL (ref 0.20–1.20)
BUN: 22.5 mg/dL (ref 7.0–26.0)
CALCIUM: 9.7 mg/dL (ref 8.4–10.4)
CO2: 29 mEq/L (ref 22–29)
Chloride: 101 mEq/L (ref 98–109)
Creatinine: 1.3 mg/dL (ref 0.7–1.3)
EGFR: 52 mL/min/{1.73_m2} — AB (ref >90)
GLUCOSE: 106 mg/dL (ref 70–140)
Potassium: 4.6 mEq/L (ref 3.5–5.1)
SODIUM: 140 meq/L (ref 136–145)
TOTAL PROTEIN: 6.7 g/dL (ref 6.4–8.3)

## 2015-10-30 LAB — CBC & DIFF AND RETIC
BASO%: 0.6 % (ref 0.0–2.0)
BASOS ABS: 0 10*3/uL (ref 0.0–0.1)
EOS%: 3.9 % (ref 0.0–7.0)
Eosinophils Absolute: 0.1 10*3/uL (ref 0.0–0.5)
HEMATOCRIT: 37.3 % — AB (ref 38.4–49.9)
HGB: 12.5 g/dL — ABNORMAL LOW (ref 13.0–17.1)
Immature Retic Fract: 6.7 % (ref 3.00–10.60)
LYMPH%: 12.8 % — AB (ref 14.0–49.0)
MCH: 28.5 pg (ref 27.2–33.4)
MCHC: 33.5 g/dL (ref 32.0–36.0)
MCV: 85.2 fL (ref 79.3–98.0)
MONO#: 0.4 10*3/uL (ref 0.1–0.9)
MONO%: 12.8 % (ref 0.0–14.0)
NEUT#: 2.3 10*3/uL (ref 1.5–6.5)
NEUT%: 69.9 % (ref 39.0–75.0)
PLATELETS: 165 10*3/uL (ref 140–400)
RBC: 4.38 10*6/uL (ref 4.20–5.82)
RDW: 12.6 % (ref 11.0–14.6)
RETIC CT ABS: 14.02 10*3/uL — AB (ref 34.80–93.90)
Retic %: <0.38 % — ABNORMAL LOW (ref 0.5–1.6)
WBC: 3.4 10*3/uL — ABNORMAL LOW (ref 4.0–10.3)
lymph#: 0.4 10*3/uL — ABNORMAL LOW (ref 0.9–3.3)

## 2015-10-30 LAB — MAGNESIUM: MAGNESIUM: 1.7 mg/dL (ref 1.5–2.5)

## 2015-10-30 MED ORDER — PALONOSETRON HCL INJECTION 0.25 MG/5ML
INTRAVENOUS | Status: AC
  Filled 2015-10-30: qty 5

## 2015-10-30 MED ORDER — FAMOTIDINE IN NACL 20-0.9 MG/50ML-% IV SOLN
20.0000 mg | Freq: Once | INTRAVENOUS | Status: AC
  Administered 2015-10-30: 20 mg via INTRAVENOUS

## 2015-10-30 MED ORDER — OMEPRAZOLE 2 MG/ML ORAL SUSPENSION: 40.0000 mg | Freq: Every day | ORAL | 1 refills | Status: DC

## 2015-10-30 MED ORDER — SODIUM CHLORIDE 0.9 % IV SOLN
139.4000 mg | Freq: Once | INTRAVENOUS | Status: AC
  Administered 2015-10-30: 140 mg via INTRAVENOUS
  Filled 2015-10-30: qty 14

## 2015-10-30 MED ORDER — PALONOSETRON HCL INJECTION 0.25 MG/5ML
0.2500 mg | Freq: Once | INTRAVENOUS | Status: AC
  Administered 2015-10-30: 0.25 mg via INTRAVENOUS

## 2015-10-30 MED ORDER — FAMOTIDINE IN NACL 20-0.9 MG/50ML-% IV SOLN
INTRAVENOUS | Status: AC
  Filled 2015-10-30: qty 50

## 2015-10-30 MED ORDER — SODIUM CHLORIDE 0.9 % IV SOLN
Freq: Once | INTRAVENOUS | Status: AC
Start: 2015-10-30 — End: 2015-10-30
  Administered 2015-10-30: 11:00:00 via INTRAVENOUS

## 2015-10-30 MED ORDER — SENNOSIDES 8.8 MG/5ML PO SYRP: 10.0000 mL | ORAL_SOLUTION | Freq: Every day | ORAL | Status: DC

## 2015-10-30 MED ORDER — OMEPRAZOLE 2 MG/ML ORAL SUSPENSION: 40.0000 mg | Freq: Every day | ORAL | Status: DC

## 2015-10-30 MED ORDER — PACLITAXEL CHEMO INJECTION 300 MG/50ML
50.0000 mg/m2 | Freq: Once | INTRAVENOUS | Status: AC
  Administered 2015-10-30: 90 mg via INTRAVENOUS
  Filled 2015-10-30: qty 15

## 2015-10-30 MED ORDER — SODIUM CHLORIDE 0.9 % IV SOLN
Freq: Once | INTRAVENOUS | Status: AC
  Administered 2015-10-30: 11:00:00 via INTRAVENOUS

## 2015-10-30 MED ORDER — DIPHENHYDRAMINE HCL 50 MG/ML IJ SOLN
50.0000 mg | Freq: Once | INTRAMUSCULAR | Status: AC
  Administered 2015-10-30: 50 mg via INTRAVENOUS

## 2015-10-30 MED ORDER — HEPARIN SOD (PORK) LOCK FLUSH 100 UNIT/ML IV SOLN
500.0000 [IU] | Freq: Once | INTRAVENOUS | Status: AC | PRN
  Administered 2015-10-30: 500 [IU]
  Filled 2015-10-30: qty 5

## 2015-10-30 MED ORDER — SODIUM CHLORIDE 0.9% FLUSH
10.0000 mL | INTRAVENOUS | Status: DC | PRN
  Administered 2015-10-30: 10 mL
  Filled 2015-10-30: qty 10

## 2015-10-30 MED ORDER — METOCLOPRAMIDE HCL 10 MG PO TABS: 10.0000 mg | ORAL_TABLET | Freq: Three times a day (TID) | ORAL | 0 refills | Status: AC

## 2015-10-30 MED ORDER — DIPHENHYDRAMINE HCL 50 MG/ML IJ SOLN
INTRAMUSCULAR | Status: AC
  Filled 2015-10-30: qty 1

## 2015-10-30 MED ORDER — SODIUM CHLORIDE 0.9 % IV SOLN
20.0000 mg | Freq: Once | INTRAVENOUS | Status: AC
  Administered 2015-10-30: 20 mg via INTRAVENOUS
  Filled 2015-10-30: qty 2

## 2015-10-30 MED ORDER — SENNOSIDES 8.8 MG/5ML PO SYRP: 10.0000 mL | ORAL_SOLUTION | Freq: Every day | ORAL | 0 refills | Status: DC

## 2015-10-30 NOTE — Progress Notes (Signed)
Nutrition follow-up completed with patient receiving chemotherapy and radiation therapy for esophageal cancer. Patient is now only tolerating one can of Osmolite 1.5 via PEG. Reports he feels too full to consume more tube feeding. States he thinks he is "okay" with one can a day. Reports M.D. has provided 2 additional medications however he cannot tell me what they are. Weight decreased and documented as 143.5 pounds August 2, down from 146.8 pounds July 28, albumin decreased at 3.3. Oral intake decreased secondary to taste alterations and "spitting up".  Nutrition diagnosis: Severe malnutrition continues.  Estimated nutrition needs: 1900-2100 calories, 82-95 grams protein, 2 L fluid.  Intervention: Asked patient to consider continuous tube feeding as an option to tolerate increased nutrition.  However, patient refuses. Reminded patient goal rate of formula is equal to 6 cans daily to provide healing and minimize weight loss. Encouraged patient to take new medications and will follow-up with him regarding tube feeding tolerance.  Monitoring, evaluation, goals:  Patient will tolerate increased tube feeding to minimize further weight loss.  Next visit: Wednesday August 9.  **Disclaimer: This note was dictated with voice recognition software. Similar sounding words can inadvertently be transcribed and this note may contain transcription errors which may not have been corrected upon publication of note.**

## 2015-10-30 NOTE — Patient Instructions (Signed)
[facility] Discharge Instructions for Patients Receiving Chemotherapy  Today you received the following chemotherapy agents Taxol/Carboplatin To help prevent nausea and vomiting after your treatment, we encourage you to take your nausea medication as prescribed.   If you develop nausea and vomiting that is not controlled by your nausea medication, call the clinic.   BELOW ARE SYMPTOMS THAT SHOULD BE REPORTED IMMEDIATELY:  *FEVER GREATER THAN 100.5 F  *CHILLS WITH OR WITHOUT FEVER  NAUSEA AND VOMITING THAT IS NOT CONTROLLED WITH YOUR NAUSEA MEDICATION  *UNUSUAL SHORTNESS OF BREATH  *UNUSUAL BRUISING OR BLEEDING  TENDERNESS IN MOUTH AND THROAT WITH OR WITHOUT PRESENCE OF ULCERS  *URINARY PROBLEMS  *BOWEL PROBLEMS  UNUSUAL RASH Items with * indicate a potential emergency and should be followed up as soon as possible.  Feel free to call the clinic you have any questions or concerns. The clinic phone number is [phone].  Please show the CHEMO ALERT CARD at check-in to the Emergency Department and triage nurse.   

## 2015-10-30 NOTE — Telephone Encounter (Signed)
per pof to add MD-cld pt and left message of change made to sch-adv to stop by scheduling and get updated copy if needed

## 2015-10-31 ENCOUNTER — Ambulatory Visit
Admission: RE | Admit: 2015-10-31 | Discharge: 2015-10-31 | Disposition: A | Discharge status: Home / Self Care | Payer: Medicare Other | Source: Ambulatory Visit | Attending: Radiation Oncology | Admitting: Radiation Oncology

## 2015-10-31 DIAGNOSIS — Z510 Encounter for antineoplastic radiation therapy: Secondary | ICD-10-CM | POA: Diagnosis not present

## 2015-11-01 ENCOUNTER — Ambulatory Visit
Admission: RE | Admit: 2015-11-01 | Discharge: 2015-11-01 | Disposition: A | Discharge status: Home / Self Care | Payer: Medicare Other | Source: Ambulatory Visit | Attending: Radiation Oncology | Admitting: Radiation Oncology

## 2015-11-01 ENCOUNTER — Telehealth: Payer: Self-pay | Admitting: *Deleted

## 2015-11-01 ENCOUNTER — Encounter: Payer: Self-pay | Admitting: Radiation Oncology

## 2015-11-01 VITALS — BP 115/72 | HR 98 | Temp 97.8°F | Resp 22 | Wt 146.0 lb

## 2015-11-01 DIAGNOSIS — C155 Malignant neoplasm of lower third of esophagus: Secondary | ICD-10-CM

## 2015-11-01 DIAGNOSIS — Z510 Encounter for antineoplastic radiation therapy: Secondary | ICD-10-CM | POA: Diagnosis not present

## 2015-11-01 NOTE — Progress Notes (Addendum)
Weekly rad tx esophagus, 17/30 completed, no skin changes, coughs up white thick saliva/mucus on 3 liters n/c, sob,   Getting 2 cans osmolite via peg daily now, stilleating soft foods , no difficulty swallowing those satted,  Weak and fatigued, no pain. BP 115/72 (BP Location: Left Arm, Patient Position: Sitting, Cuff Size: Normal)   Pulse 98   Temp 97.8 F (36.6 C) (Oral)   Resp (!) 22   Wt 146 lb (66.2 kg)   SpO2 95% Comment: 3 liters n/c  BMI 23.93 kg/m   Wt Readings from Last 3 Encounters:  11/01/15 146 lb (66.2 kg)  10/30/15 143 lb 8 oz (65.1 kg)  10/25/15 146 lb 12.8 oz (66.6 kg)

## 2015-11-01 NOTE — Progress Notes (Signed)
Department of Radiation Oncology  Phone:  [phone] Fax:        [phone]  Weekly Treatment Note    Name: [name] Date: 11/01/2015 MRN: [id number] DOB: 01/12/1932   Diagnosis:     ICD-9-CM ICD-10-CM   1. Cancer of lower third of esophagus (HCC) 150.5 C15.5      Current dose: 34 Gy  Current fraction:17   MEDICATIONS: Current Outpatient Prescriptions  Medication Sig Dispense Refill  . albuterol (PROAIR HFA) 108 (90 BASE) MCG/ACT inhaler Inhale 2 puffs into the lungs every 6 (six) hours as needed. For shortness of breath 1 Inhaler 2  . albuterol (PROVENTIL) (2.5 MG/3ML) 0.083% nebulizer solution Take 3 mLs (2.5 mg total) by nebulization 4 (four) times daily as needed. For shortness of breath 75 mL 11  . aspirin 81 MG chewable tablet Chew 81 mg by mouth daily. Reported on 10/11/2015    . atorvastatin (LIPITOR) 40 MG tablet Take 40 mg by mouth daily. Reported on 10/11/2015    . cholecalciferol (VITAMIN D) 1000 units tablet Take 1,000 Units by mouth daily.    . clopidogrel (PLAVIX) 75 MG tablet Take 75 mg by mouth daily. Reported on 10/11/2015    . dexamethasone (DECADRON) 4 MG tablet Take 2 tablets (8 mg total) by mouth daily. Start the day after chemotherapy for 2 days. 30 tablet 1  . folic acid (FOLVITE) 1 MG tablet Take 1 mg by mouth daily. Reported on 10/11/2015    . lidocaine-prilocaine (EMLA) cream Apply to affected area once 30 g 3  . losartan (COZAAR) 25 MG tablet Take 25 mg by mouth daily.    . metoCLOPramide (REGLAN) 10 MG tablet Take 1 tablet (10 mg total) by mouth 3 (three) times daily before meals. 90 tablet 0  . metoprolol tartrate (LOPRESSOR) 25 MG tablet Take 25 mg by mouth daily.    . Nutritional Supplements (FEEDING SUPPLEMENT, OSMOLITE 1.5 CAL,) LIQD Begin 1/2 can Osmolite 1.5 or equivalent via PEG QID with 120 cc free water before and after. Increase by one can every other day to goal of 6 cans daily (1.5 cans QID) as tolerated. 1422 mL 0  . ondansetron  (ZOFRAN) 8 MG tablet Take 1 tablet (8 mg total) by mouth 2 (two) times daily as needed for refractory nausea / vomiting. Start on day 3 after chemo. 30 tablet 1  . prochlorperazine (COMPAZINE) 10 MG tablet Take 1 tablet (10 mg total) by mouth every 6 (six) hours as needed (Nausea or vomiting). 30 tablet 1  . sennosides (SENOKOT) 8.8 MG/5ML syrup Place 10 mLs into feeding tube at bedtime. 240 mL 0  . vitamin B-12 (CYANOCOBALAMIN) 1000 MCG tablet Take 1,000 mcg by mouth daily.    [name] omeprazole (PRILOSEC) 2 mg/mL SUSP Place 20 mLs (40 mg total) into feeding tube daily. (Patient not taking: Reported on 11/01/2015) 600 mL 1  . Wound Dressings (SONAFINE) Apply 1 application topically 2 (two) times daily.     No current facility-administered medications for this encounter.      ALLERGIES: Symbicort [budesonide-formoterol fumarate] and Tetanus toxoids   LABORATORY DATA:  Lab Results  Component Value Date   WBC 3.4 (L) 10/30/2015   HGB 12.5 (L) 10/30/2015   HCT 37.3 (L) 10/30/2015   MCV 85.2 10/30/2015   PLT 165 10/30/2015   Lab Results  Component Value Date   NA 140 10/30/2015   K 4.6 10/30/2015   CL 104 10/14/2015   CO2 29 10/30/2015   Lab Results  Component Value Date   ALT 12 10/30/2015   AST 16 10/30/2015   ALKPHOS 54 10/30/2015   BILITOT 0.99 10/30/2015     NARRATIVE: [name] was seen today for weekly treatment management. The chart was checked and the patient's films were reviewed.  Weekly rad tx esophagus, 17/30 completed, no skin changes, coughs up white thick saliva/mucus on 3 liters n/c, sob,   Getting 2 cans osmolite via peg daily now, stilleating soft foods , no difficulty swallowing those satted,  Weak and fatigued, no pain. BP 115/72 (BP Location: Left Arm, Patient Position: Sitting, Cuff Size: Normal)   Pulse 98   Temp 97.8 F (36.6 C) (Oral)   Resp (!) 22   Wt 146 lb (66.2 kg)   SpO2 95% Comment: 3 liters n/c  BMI 23.93 kg/m   Wt Readings from Last 3  Encounters:  11/01/15 146 lb (66.2 kg)  10/30/15 143 lb 8 oz (65.1 kg)  10/25/15 146 lb 12.8 oz (66.6 kg)      PHYSICAL EXAMINATION: weight is 146 lb (66.2 kg). His oral temperature is 97.8 F (36.6 C). His blood pressure is 115/72 and his pulse is 98. His respiration is 22 (abnormal) and oxygen saturation is 95%.        ASSESSMENT: The patient is doing satisfactorily with treatment.  PLAN: We will continue with the patient's radiation treatment as planned.

## 2015-11-03 NOTE — Progress Notes (Signed)
[name]    HEMATOLOGY/ONCOLOGY CLINIC NOTE  Date of Service: 10/30/2015  Patient Care Team: [name], MD as PCP - General (Family Medicine) Kathee Delton, MD (Pulmonary Disease) Adrian Prows, MD as Attending Physician (Cardiology)  CHIEF COMPLAINTS/PURPOSE OF CONSULTATION:  Newly diagnosed Esophageal squamous cell carcinoma  INTERVAL HISTORY  [name] is here for his scheduled follow-up  prior to his third cycle of carboplatin and Taxol concurrent with chemotherapy. He reports some grade 1-2 fatigue. Reports that he was able to swallow fluids and well chewed pizza and shrimp. I recommended he take it easy with the solids oral. He notes increase abdominal fullness after using tube feeding and feels like the food just sits there. He has also been constipated. We increase the dose of his Reglan and also started on liquid senna to address his constipation. No significant uncontrolled pain. No fevers or chills.   MEDICAL HISTORY:  Past Medical History:  Diagnosis Date  . Anemia   . CAD (coronary artery disease)    Dr Einar Gip every 6 months  . Cardiomyopathy    EF 25% by echo 2009  . Carotid artery occlusion   . COPD (chronic obstructive pulmonary disease) ([place])    on O2 constantly  . DVT (deep venous thrombosis) ([place])   . Emphysema   . Esophageal cancer ([place])    'causes chest discomfort, liquids and solids"  . History of oxygen administration    Oxygen 3 l/m at rest / 4 l/m when active. 24/7  . Hypertension   . Keeps losing balance   . Myocardial infarction ([place]) 1986  . Peripheral vascular disease ([place])   . Pulmonary nodule    s/p wedge resction, inflammatory, +MAC  . Shortness of breath   . Vitamin B 12 deficiency     SURGICAL HISTORY: Past Surgical History:  Procedure Laterality Date  . ARCH AORTOGRAM N/A 07/21/2011   Procedure: ARCH AORTOGRAM;  Surgeon: Laverda Page, MD;  Location: [facility] CATH LAB;  Service: Cardiovascular;  Laterality: N/A;  . CAROTID  ENDARTERECTOMY  2009   triple bypass  . CAROTID-VERTEBRAL BYPASS GRAFT  08/20/2011   Procedure: BYPASS GRAFT CAROTID-VERTEBRAL;  Surgeon: Serafina Mitchell, MD;  Location: [facility] OR;  Service: Vascular;  Laterality: Right;  Right CAROTID-VERTEBRAL TRANSPOSITION  . CORONARY ARTERY BYPASS GRAFT  2009  . ESOPHAGOGASTRODUODENOSCOPY (EGD) WITH PROPOFOL N/A 09/19/2015   Procedure: ESOPHAGOGASTRODUODENOSCOPY (EGD) WITH PROPOFOL;  Surgeon: Clarene Essex, MD;  Location: [facility] ENDOSCOPY;  Service: Endoscopy;  Laterality: N/A;  . LUNG SURGERY  2008   tumor"infection"  . PR VEIN BYPASS GRAFT,AORTO-FEM-POP    . SAVORY DILATION N/A 09/19/2015   Procedure: SAVORY DILATION;  Surgeon: Clarene Essex, MD;  Location: [facility] ENDOSCOPY;  Service: Endoscopy;  Laterality: N/A;  needs carm  . VASCULAR SURGERY      SOCIAL HISTORY: Social History   Social History  . Marital status: Divorced    Spouse name: N/A  . Number of children: N/A  . Years of education: N/A   Occupational History  . Retired Retired    Charity fundraiser   Social History Main Topics  . Smoking status: Former Smoker    Packs/day: 2.00    Years: 56.00    Types: Cigarettes    Quit date: 03/30/2006  . Smokeless tobacco: Never Used  . Alcohol use 2.4 oz/week    4 Standard drinks or equivalent per week  . Drug use: No  . Sexual activity: Not on file   Other Topics Concern  .  Not on file   Social History Narrative  . No narrative on file    FAMILY HISTORY: Family History  Problem Relation Age of Onset  . Heart disease Father   . Diabetes Sister   . Anesthesia problems Neg Hx     ALLERGIES:  is allergic to symbicort [budesonide-formoterol fumarate] and tetanus toxoids.  MEDICATIONS:  Current Outpatient Prescriptions  Medication Sig Dispense Refill  . albuterol (PROAIR HFA) 108 (90 BASE) MCG/ACT inhaler Inhale 2 puffs into the lungs every 6 (six) hours as needed. For shortness of breath 1 Inhaler 2  . albuterol (PROVENTIL) (2.5 MG/3ML) 0.083% nebulizer  solution Take 3 mLs (2.5 mg total) by nebulization 4 (four) times daily as needed. For shortness of breath 75 mL 11  . aspirin 81 MG chewable tablet Chew 81 mg by mouth daily. Reported on 10/11/2015    . atorvastatin (LIPITOR) 40 MG tablet Take 40 mg by mouth daily. Reported on 10/11/2015    . cholecalciferol (VITAMIN D) 1000 units tablet Take 1,000 Units by mouth daily.    . clopidogrel (PLAVIX) 75 MG tablet Take 75 mg by mouth daily. Reported on 10/11/2015    . dexamethasone (DECADRON) 4 MG tablet Take 2 tablets (8 mg total) by mouth daily. Start the day after chemotherapy for 2 days. 30 tablet 1  . folic acid (FOLVITE) 1 MG tablet Take 1 mg by mouth daily. Reported on 10/11/2015    . lidocaine-prilocaine (EMLA) cream Apply to affected area once 30 g 3  . losartan (COZAAR) 25 MG tablet Take 25 mg by mouth daily.    . metoCLOPramide (REGLAN) 10 MG tablet Take 1 tablet (10 mg total) by mouth 3 (three) times daily before meals. 90 tablet 0  . metoprolol tartrate (LOPRESSOR) 25 MG tablet Take 25 mg by mouth daily.    . Nutritional Supplements (FEEDING SUPPLEMENT, OSMOLITE 1.5 CAL,) LIQD Begin 1/2 can Osmolite 1.5 or equivalent via PEG QID with 120 cc free water before and after. Increase by one can every other day to goal of 6 cans daily (1.5 cans QID) as tolerated. 1422 mL 0  . ondansetron (ZOFRAN) 8 MG tablet Take 1 tablet (8 mg total) by mouth 2 (two) times daily as needed for refractory nausea / vomiting. Start on day 3 after chemo. 30 tablet 1  . prochlorperazine (COMPAZINE) 10 MG tablet Take 1 tablet (10 mg total) by mouth every 6 (six) hours as needed (Nausea or vomiting). 30 tablet 1  . vitamin B-12 (CYANOCOBALAMIN) 1000 MCG tablet Take 1,000 mcg by mouth daily.    . Wound Dressings (SONAFINE) Apply 1 application topically 2 (two) times daily.    [name] omeprazole (PRILOSEC) 2 mg/mL SUSP Place 20 mLs (40 mg total) into feeding tube daily. (Patient not taking: Reported on 11/01/2015) 600 mL 1  .  sennosides (SENOKOT) 8.8 MG/5ML syrup Place 10 mLs into feeding tube at bedtime. 240 mL 0   No current facility-administered medications for this visit.     REVIEW OF SYSTEMS:    10 Point review of Systems was done is negative except as noted above.  PHYSICAL EXAMINATION: ECOG PERFORMANCE STATUS:2  GENERAL:alert, in no acute distress and comfortable SKIN: skin color, texture, turgor are normal, no rashes or significant lesions EYES: normal, conjunctiva are pink and non-injected, sclera clear OROPHARYNX:  Moist mucous membranes. No mucositis  NECK: supple, no JVD, thyroid normal size, non-tender, without nodularity LYMPH:  no palpable lymphadenopathy in the cervical, axillary or inguinal LUNGS: clear to  auscultation with Decreased air entry bilaterally.  HEART: regular rate & rhythm,  no murmurs and no lower extremity edema ABDOMEN: abdomen soft, non-tender, normoactive bowel sounds  Musculoskeletal: no cyanosis of digits and no clubbing  PSYCH: alert & oriented x 3 with fluent speech NEURO: no focal motor/sensory deficits  LABORATORY DATA:  I have reviewed the data as listed  . CBC Latest Ref Rng & Units 10/30/2015 10/22/2015 10/15/2015  WBC 4.0 - 10.3 10e3/uL 3.4(L) 5.3 8.8  Hemoglobin 13.0 - 17.1 g/dL 12.5(L) 13.3 13.5  Hematocrit 38.4 - 49.9 % 37.3(L) 39.0 40.0  Platelets 140 - 400 10e3/uL 165 194 243    . CMP Latest Ref Rng & Units 10/30/2015 10/22/2015 10/18/2015  Glucose 70 - 140 mg/dl 106 111 134  BUN 7.0 - 26.0 mg/dL 22.5 20.9 27.3(H)  Creatinine 0.7 - 1.3 mg/dL 1.3 1.1 1.0  Sodium 136 - 145 mEq/L 140 142 143  Potassium 3.5 - 5.1 mEq/L 4.6 4.8 4.6  Chloride 101 - 111 mmol/L - - -  CO2 22 - 29 mEq/L 29 31(H) 29  Calcium 8.4 - 10.4 mg/dL 9.7 10.0 9.7  Total Protein 6.4 - 8.3 g/dL 6.7 6.7 7.1  Total Bilirubin 0.20 - 1.20 mg/dL 0.99 0.95 0.82  Alkaline Phos 40 - 150 U/L 54 54 57  AST 5 - 34 U/L 16 14 17   ALT 0 - 55 U/L 12 9 <9    RADIOGRAPHIC STUDIES:   EGD  09/19/2015  - Normal larynx. - Partially obstructing, malignant esophageal tumor was found in the lower third of the   esophagus. Biopsied. - Normal upper third of esophagus and middle third of esophagus. - The examination was otherwise normal. but unable to pass scope past the mass  I have personally reviewed the radiological images as listed and agreed with the findings in the report.  Nm Pet Image Initial (pi) Skull Base To Thigh  Result Date: 09/27/2015 CLINICAL DATA:  Initial Treatment strategy for esophageal mass. EXAM: NUCLEAR MEDICINE PET SKULL BASE TO THIGH TECHNIQUE: 7.8 mCi F-18 FDG was injected intravenously. Full-ring PET imaging was performed from the skull base to thigh after the radiotracer. CT data was obtained and used for attenuation correction and anatomic localization. FASTING BLOOD GLUCOSE:  Value: 109 mg/dl COMPARISON:  01/25/2007 FINDINGS: NECK Accentuated but relatively symmetric lymphoid activity at the tongue base and tonsillar pillars, maximum SUV 8.7. Accentuated activity along the glottis is reasonably symmetric and probably physiologic. There is some high activity in the cervical esophagus which is thought to likely be physiologic. CHEST Dilated esophagus with frothy material extending down to a distal esophageal mass which has maximum standard uptake value of 30.7 and which extends over approximately 6 cm length of the esophagus. In addition, there is a second focus of accentuated esophageal activity in the distal esophagus near the gastroesophageal junction, measuring about 2.5 cm in length, with maximum standard uptake value 14.9. Coronary, aortic arch, and branch vessel atherosclerotic vascular disease. Prior CABG. Hypermetabolic right hilar lymph node with maximum SUV 5.0. An adjacent tiny right lower paratracheal node adjacent to the right mainstem bronchus has maximum SUV of 4.7. Subcarinal node measures 0.8 cm in short axis on image 76/4 and has a maximum SUV a 4.5.  Severe emphysema. There is some scarring in the left lung, likely postoperative. ABDOMEN/PELVIS Gastrohepatic ligament lymph node measuring 1.5 cm in short axis on image 108/4 with maximum standard uptake value 9.9. I do not observe a liver mass. Right adrenal adenoma measures  2.6 by 2.1 cm, density 6 Hounsfield units, minimal metabolic activity. Aortoiliac atherosclerotic vascular disease. Cholelithiasis noted with layering gallstones in the gallbladder. Mildly prominent prostate gland. Postoperative findings along the left groin. SKELETON No focal hypermetabolic activity to suggest skeletal metastasis. IMPRESSION: 1. 6 cm long hypermetabolic region in the distal esophagus compatible with esophageal cancer (max SUV 30.7). There is a second 2.5 cm region of hypermetabolic activity in the distal most esophagus just proximal to the gastroesophageal junction which could represent a second focus of cancer (max SUV 14.9). The there is enlarged gastrohepatic ligament lymph node which is highly hypermetabolic (max SUV 9.9), and faint hypermetabolic activity in several small mediastinal lymph nodes including a right hilar node, a small lymph node anterior to the right mainstem bronchus, and a subcarinal lymph node. 2. Other imaging findings of potential clinical significance: Coronary, aortic arch, and branch vessel atherosclerotic vascular disease. Prior CABG Aortoiliac atherosclerotic vascular disease. Severe emphysema. Scarring in the left upper lung, postoperative. Right adrenal adenoma. Cholelithiasis. Mildly prominent prostate gland. Electronically Signed   By: Van Clines M.D.   On: 09/27/2015 09:42   ASSESSMENT & PLAN:   82 year old [place] male with multiple medical comorbidities including significant coronary artery disease status post bypass and ischemic cardiomyopathy, COPD which is oxygen dependent at 3 L/min, peripheral vascular disease status post stenting of his lower extremity arteries on  multiple antiplatelet therapies now with.  #1 Stage III multifocal lower third of the esophagus invasive squamous cell carcinoma with an enlarged gastrohepatic ligament lymph node and faint hypermetabolic several small mediastinal lymph nodes including a right hilar lymph node and a subcarinal lymph node. No overt distal metastatic disease noted.   His previous heavy alcohol use and smoking are the likely etiology. He notes that he has quit heavy smoking and drinks only 2-3 beers a week since his cardiac bypass surgery in 2009. Patient has an ECOG performance status of 2  No prohibitive toxicities other than some grade 1-2 fatigue. Labs today are stable.  Plan -Patient okay to proceed with third cycle of carboplatin and Taxol chemotherapy concurrent with ongoing radiation.  #2 severe protein calorie malnutrition tolerating tube feeding but has symptoms of excessive gastric fullness. Likely some element of gastroparesis.  Plan -Continue tube feeding as per recommendations of Ernestene Kiel. -Increased Reglan to 10 mg pre-meals to help with gastroparetic symptoms. -Started on liquid senna to help with constipation. -Recommended walking for 10-15 minutes after tube feeding.  #3 CAD status post CABG, ischemic cardiomyopathy ejection fraction 25% in 2009. -Repeat echocardiogram shows improvement in ejection fraction 35-40%. Patient also noted to have severe calcific aortic stenosis with moderate to severe aortic stenosis and moderate mitral regurgitation. Plan -Patient follows with Dr. Einar Gip for his cardiology needs.  #3 peripheral vascular disease. Has stents in his lower extremity. Currently on aspirin plus Plavix. No evidence of bleeding.  t#4 ex-smoker more than 100-pack-year history of smoking.  #5 Ex heavy alcohol use  #6 COPD with chronic hypoxic respiratory failure on 3 L/min oxygen by nasal cannula. Plan -Continue follow-up with primary care physician for ongoing management of his  multiple medical comorbidities.   RTC with Dr. Irene Limbo in 1 week with repeat labs prior to 4th cycle of carbo/taxol.  All of the patients questions were answered with apparent satisfaction. The patient knows to call the clinic with any problems, questions or concerns.  I spent 15 minutes counseling the patient face to face. The total time spent in the appointment was 20  minutes and more than 50% was on counseling and direct patient cares.    Sullivan Lone MD [place] AAHIVMS [facility] Hematology/Oncology Physician [facility]  (Office):       [phone] (Work cell):  [phone] (Fax):           [phone]

## 2015-11-04 ENCOUNTER — Telehealth: Payer: Self-pay | Admitting: *Deleted

## 2015-11-04 ENCOUNTER — Ambulatory Visit
Admission: RE | Admit: 2015-11-04 | Discharge: 2015-11-04 | Disposition: A | Discharge status: Home / Self Care | Payer: Medicare Other | Source: Ambulatory Visit | Attending: Radiation Oncology | Admitting: Radiation Oncology

## 2015-11-04 DIAGNOSIS — Z510 Encounter for antineoplastic radiation therapy: Secondary | ICD-10-CM | POA: Diagnosis not present

## 2015-11-04 NOTE — Telephone Encounter (Addendum)
[name] from pharmacy called  Stating that pateint's pepcid from Dr. Alen Blew rx for prilosec suspension  was not covered by his insurance and was $160.00,  Will speak with the patient, saw patient here in waiting room  And he stated"No it is too much money" forget it", [name] had called back and [name] informed [name] patient refusing to get this rx will let Dr. Clement Husbands and Dr. Lisbeth Renshaw know and [name] PA

## 2015-11-04 NOTE — Telephone Encounter (Signed)
error 

## 2015-11-05 ENCOUNTER — Other Ambulatory Visit (HOSPITAL_BASED_OUTPATIENT_CLINIC_OR_DEPARTMENT_OTHER): Payer: Medicare Other

## 2015-11-05 ENCOUNTER — Ambulatory Visit
Admission: RE | Admit: 2015-11-05 | Discharge: 2015-11-05 | Disposition: A | Discharge status: Home / Self Care | Payer: Medicare Other | Source: Ambulatory Visit | Attending: Radiation Oncology | Admitting: Radiation Oncology

## 2015-11-05 ENCOUNTER — Ambulatory Visit (HOSPITAL_BASED_OUTPATIENT_CLINIC_OR_DEPARTMENT_OTHER): Payer: Medicare Other | Admitting: Hematology

## 2015-11-05 ENCOUNTER — Encounter: Payer: Self-pay | Admitting: Hematology

## 2015-11-05 VITALS — BP 88/61 | HR 98 | Temp 97.6°F | Resp 18 | Ht 65.5 in | Wt 138.4 lb

## 2015-11-05 DIAGNOSIS — C159 Malignant neoplasm of esophagus, unspecified: Secondary | ICD-10-CM

## 2015-11-05 DIAGNOSIS — R634 Abnormal weight loss: Secondary | ICD-10-CM

## 2015-11-05 DIAGNOSIS — C155 Malignant neoplasm of lower third of esophagus: Secondary | ICD-10-CM

## 2015-11-05 DIAGNOSIS — Z510 Encounter for antineoplastic radiation therapy: Secondary | ICD-10-CM | POA: Diagnosis not present

## 2015-11-05 DIAGNOSIS — R630 Anorexia: Secondary | ICD-10-CM

## 2015-11-05 DIAGNOSIS — E43 Unspecified severe protein-calorie malnutrition: Secondary | ICD-10-CM

## 2015-11-05 DIAGNOSIS — E860 Dehydration: Secondary | ICD-10-CM

## 2015-11-05 LAB — CBC & DIFF AND RETIC
BASO%: 0.7 % (ref 0.0–2.0)
BASOS ABS: 0 10*3/uL (ref 0.0–0.1)
EOS ABS: 0 10*3/uL (ref 0.0–0.5)
EOS%: 0.7 % (ref 0.0–7.0)
HEMATOCRIT: 35.1 % — AB (ref 38.4–49.9)
HEMOGLOBIN: 12 g/dL — AB (ref 13.0–17.1)
IMMATURE RETIC FRACT: 0.9 % — AB (ref 3.00–10.60)
LYMPH#: 0.3 10*3/uL — AB (ref 0.9–3.3)
LYMPH%: 8.6 % — ABNORMAL LOW (ref 14.0–49.0)
MCH: 28.8 pg (ref 27.2–33.4)
MCHC: 34.2 g/dL (ref 32.0–36.0)
MCV: 84.2 fL (ref 79.3–98.0)
MONO#: 0.3 10*3/uL (ref 0.1–0.9)
MONO%: 11 % (ref 0.0–14.0)
NEUT%: 79 % — ABNORMAL HIGH (ref 39.0–75.0)
NEUTROS ABS: 2.4 10*3/uL (ref 1.5–6.5)
Platelets: 155 10*3/uL (ref 140–400)
RBC: 4.17 10*6/uL — AB (ref 4.20–5.82)
RDW: 12.6 % (ref 11.0–14.6)
RETIC CT ABS: 12.51 10*3/uL — AB (ref 34.80–93.90)
WBC: 3 10*3/uL — ABNORMAL LOW (ref 4.0–10.3)

## 2015-11-05 LAB — COMPREHENSIVE METABOLIC PANEL
ALT: 12 U/L (ref 0–55)
AST: 15 U/L (ref 5–34)
Albumin: 3.5 g/dL (ref 3.5–5.0)
Alkaline Phosphatase: 53 U/L (ref 40–150)
Anion Gap: 10 mEq/L (ref 3–11)
BUN: 26.3 mg/dL — AB (ref 7.0–26.0)
CHLORIDE: 101 meq/L (ref 98–109)
CO2: 30 meq/L — AB (ref 22–29)
CREATININE: 1.2 mg/dL (ref 0.7–1.3)
Calcium: 9.6 mg/dL (ref 8.4–10.4)
EGFR: 56 mL/min/{1.73_m2} — ABNORMAL LOW (ref >90)
GLUCOSE: 96 mg/dL (ref 70–140)
Potassium: 4.4 mEq/L (ref 3.5–5.1)
SODIUM: 141 meq/L (ref 136–145)
Total Bilirubin: 0.6 mg/dL (ref 0.20–1.20)
Total Protein: 6.8 g/dL (ref 6.4–8.3)

## 2015-11-05 LAB — MAGNESIUM: Magnesium: 1.7 mg/dl (ref 1.5–2.5)

## 2015-11-05 MED ORDER — DRONABINOL 2.5 MG PO CAPS: 2.5000 mg | ORAL_CAPSULE | Freq: Two times a day (BID) | ORAL | 0 refills | Status: DC

## 2015-11-05 MED ORDER — SENNOSIDES-DOCUSATE SODIUM 8.6-50 MG PO TABS: 2.0000 | ORAL_TABLET | Freq: Two times a day (BID) | ORAL | 1 refills | Status: DC

## 2015-11-05 NOTE — Telephone Encounter (Signed)
This is sent to me in error. Please forward to the appropriate MD. Thanks.

## 2015-11-05 NOTE — Telephone Encounter (Signed)
[name] refused the prilosec suspension, per [facility] pharmacy price $160.00, stated the tablet form didn't help and that cost him $90.00,

## 2015-11-06 ENCOUNTER — Ambulatory Visit (HOSPITAL_BASED_OUTPATIENT_CLINIC_OR_DEPARTMENT_OTHER): Payer: Medicare Other

## 2015-11-06 ENCOUNTER — Other Ambulatory Visit: Payer: Medicare Other

## 2015-11-06 ENCOUNTER — Ambulatory Visit
Admission: RE | Admit: 2015-11-06 | Discharge: 2015-11-06 | Disposition: A | Discharge status: Home / Self Care | Payer: Medicare Other | Source: Ambulatory Visit | Attending: Radiation Oncology | Admitting: Radiation Oncology

## 2015-11-06 ENCOUNTER — Ambulatory Visit: Payer: Medicare Other | Admitting: Nutrition

## 2015-11-06 VITALS — BP 97/75 | HR 85 | Temp 97.7°F | Resp 20

## 2015-11-06 DIAGNOSIS — Z5111 Encounter for antineoplastic chemotherapy: Secondary | ICD-10-CM | POA: Diagnosis not present

## 2015-11-06 DIAGNOSIS — C155 Malignant neoplasm of lower third of esophagus: Secondary | ICD-10-CM | POA: Diagnosis not present

## 2015-11-06 DIAGNOSIS — Z510 Encounter for antineoplastic radiation therapy: Secondary | ICD-10-CM | POA: Diagnosis not present

## 2015-11-06 MED ORDER — SODIUM CHLORIDE 0.9 % IV SOLN
Freq: Once | INTRAVENOUS | Status: AC
  Administered 2015-11-06: 12:00:00 via INTRAVENOUS

## 2015-11-06 MED ORDER — DIPHENHYDRAMINE HCL 50 MG/ML IJ SOLN
INTRAMUSCULAR | Status: AC
  Filled 2015-11-06: qty 1

## 2015-11-06 MED ORDER — FAMOTIDINE IN NACL 20-0.9 MG/50ML-% IV SOLN
20.0000 mg | Freq: Once | INTRAVENOUS | Status: AC
  Administered 2015-11-06: 20 mg via INTRAVENOUS

## 2015-11-06 MED ORDER — SODIUM CHLORIDE 0.9 % IV SOLN
20.0000 mg | Freq: Once | INTRAVENOUS | Status: AC
  Administered 2015-11-06: 20 mg via INTRAVENOUS
  Filled 2015-11-06: qty 2

## 2015-11-06 MED ORDER — SODIUM CHLORIDE 0.9 % IV SOLN
50.0000 mg/m2 | Freq: Once | INTRAVENOUS | Status: AC
  Administered 2015-11-06: 90 mg via INTRAVENOUS
  Filled 2015-11-06: qty 15

## 2015-11-06 MED ORDER — SODIUM CHLORIDE 0.9% FLUSH
10.0000 mL | INTRAVENOUS | Status: DC | PRN
  Administered 2015-11-06: 10 mL
  Filled 2015-11-06: qty 10

## 2015-11-06 MED ORDER — PALONOSETRON HCL INJECTION 0.25 MG/5ML
INTRAVENOUS | Status: AC
  Filled 2015-11-06: qty 5

## 2015-11-06 MED ORDER — FAMOTIDINE IN NACL 20-0.9 MG/50ML-% IV SOLN
INTRAVENOUS | Status: AC
  Filled 2015-11-06: qty 50

## 2015-11-06 MED ORDER — HEPARIN SOD (PORK) LOCK FLUSH 100 UNIT/ML IV SOLN
500.0000 [IU] | Freq: Once | INTRAVENOUS | Status: AC | PRN
  Administered 2015-11-06: 500 [IU]
  Filled 2015-11-06: qty 5

## 2015-11-06 MED ORDER — DIPHENHYDRAMINE HCL 50 MG/ML IJ SOLN
50.0000 mg | Freq: Once | INTRAMUSCULAR | Status: AC
  Administered 2015-11-06: 50 mg via INTRAVENOUS

## 2015-11-06 MED ORDER — PALONOSETRON HCL INJECTION 0.25 MG/5ML
0.2500 mg | Freq: Once | INTRAVENOUS | Status: AC
Start: 2015-11-06 — End: 2015-11-06
  Administered 2015-11-06: 0.25 mg via INTRAVENOUS

## 2015-11-06 MED ORDER — SODIUM CHLORIDE 0.9 % IV SOLN
145.0000 mg | Freq: Once | INTRAVENOUS | Status: AC
  Administered 2015-11-06: 150 mg via INTRAVENOUS
  Filled 2015-11-06: qty 15

## 2015-11-06 NOTE — Progress Notes (Signed)
Nutrition follow-up completed with patient receiving chemotherapy and radiation therapy for esophageal cancer. I met with both patient and his son. Patient reports he has not had his medication prescriptions filled because he cannot afford them/or [facility] will not carry them. Patient is not concerned with 8 pound weight loss.  Weight was documented as 138.4 pounds, down from 146.8 pounds July 28. Patient reports he can only infuse one can of Osmolite 1. 5 in the morning and then he is too full the rest the day.  I am not able to tell if he is taking Reglan, although it appears he is not. Patient is able to eat some soft foods but apparently he is not doing so. Patient again refuses offer of using continuous feeding pump.  Nutrition diagnosis: Severe malnutrition.  Continues.  Intervention:  I educated patient and son on the importance of increasing nutrition. Suggested patient try one half can of Osmolite 1.5 every 4 hours and increase as tolerated. Encouraged patient to fill prescriptions as directed by physician so that he can tolerate food and tube feeding.  Monitoring, evaluation, goals: Patient will work to increase tube feeding in small amounts to minimize weight loss.  Next visit: Wednesday, August 16  **Disclaimer: This note was dictated with voice recognition software. Similar sounding words can inadvertently be transcribed and this note may contain transcription errors which may not have been corrected upon publication of note.**

## 2015-11-06 NOTE — Patient Instructions (Signed)
[facility] Discharge Instructions for Patients Receiving Chemotherapy  Today you received the following chemotherapy agents Taxol and Carboplatin  To help prevent nausea and vomiting after your treatment, we encourage you to take your nausea medication     If you develop nausea and vomiting that is not controlled by your nausea medication, call the clinic.   BELOW ARE SYMPTOMS THAT SHOULD BE REPORTED IMMEDIATELY:  *FEVER GREATER THAN 100.5 F  *CHILLS WITH OR WITHOUT FEVER  NAUSEA AND VOMITING THAT IS NOT CONTROLLED WITH YOUR NAUSEA MEDICATION  *UNUSUAL SHORTNESS OF BREATH  *UNUSUAL BRUISING OR BLEEDING  TENDERNESS IN MOUTH AND THROAT WITH OR WITHOUT PRESENCE OF ULCERS  *URINARY PROBLEMS  *BOWEL PROBLEMS  UNUSUAL RASH Items with * indicate a potential emergency and should be followed up as soon as possible.  Feel free to call the clinic you have any questions or concerns. The clinic phone number is [phone].  Please show the CHEMO ALERT CARD at check-in to the Emergency Department and triage nurse.   

## 2015-11-07 ENCOUNTER — Encounter: Payer: Self-pay | Admitting: Radiation Oncology

## 2015-11-07 ENCOUNTER — Ambulatory Visit
Admission: RE | Admit: 2015-11-07 | Discharge: 2015-11-07 | Disposition: A | Discharge status: Home / Self Care | Payer: Medicare Other | Source: Ambulatory Visit | Attending: Radiation Oncology | Admitting: Radiation Oncology

## 2015-11-07 DIAGNOSIS — Z510 Encounter for antineoplastic radiation therapy: Secondary | ICD-10-CM | POA: Diagnosis not present

## 2015-11-08 ENCOUNTER — Ambulatory Visit
Admission: RE | Admit: 2015-11-08 | Discharge: 2015-11-08 | Disposition: A | Discharge status: Home / Self Care | Payer: Medicare Other | Source: Ambulatory Visit | Attending: Radiation Oncology | Admitting: Radiation Oncology

## 2015-11-08 VITALS — BP 114/70 | HR 98 | Temp 97.4°F | Resp 20 | Wt 137.7 lb

## 2015-11-08 DIAGNOSIS — C155 Malignant neoplasm of lower third of esophagus: Secondary | ICD-10-CM

## 2015-11-08 DIAGNOSIS — Z510 Encounter for antineoplastic radiation therapy: Secondary | ICD-10-CM | POA: Diagnosis not present

## 2015-11-08 NOTE — Progress Notes (Signed)
Department of Radiation Oncology  Phone:  [phone] Fax:        [phone]  Weekly Treatment Note    Name: [name] Date: 11/10/2015 MRN: [id number] DOB: 1931/08/03   Diagnosis:     ICD-9-CM ICD-10-CM   1. Cancer of lower third of esophagus (HCC) 150.5 C15.5      Current dose: 44 Gy  Current fraction:22   MEDICATIONS: Current Outpatient Prescriptions  Medication Sig Dispense Refill  . albuterol (PROAIR HFA) 108 (90 BASE) MCG/ACT inhaler Inhale 2 puffs into the lungs every 6 (six) hours as needed. For shortness of breath 1 Inhaler 2  . albuterol (PROVENTIL) (2.5 MG/3ML) 0.083% nebulizer solution Take 3 mLs (2.5 mg total) by nebulization 4 (four) times daily as needed. For shortness of breath 75 mL 11  . aspirin 81 MG chewable tablet Chew 81 mg by mouth daily. Reported on 10/11/2015    . atorvastatin (LIPITOR) 40 MG tablet Take 40 mg by mouth daily. Reported on 10/11/2015    . cholecalciferol (VITAMIN D) 1000 units tablet Take 1,000 Units by mouth daily.    . clopidogrel (PLAVIX) 75 MG tablet Take 75 mg by mouth daily. Reported on 10/11/2015    . dexamethasone (DECADRON) 4 MG tablet Take 2 tablets (8 mg total) by mouth daily. Start the day after chemotherapy for 2 days. 30 tablet 1  . dronabinol (MARINOL) 2.5 MG capsule Take 1 capsule (2.5 mg total) by mouth 2 (two) times daily before a meal. 50 capsule 0  . folic acid (FOLVITE) 1 MG tablet Take 1 mg by mouth daily. Reported on 10/11/2015    . lidocaine-prilocaine (EMLA) cream Apply to affected area once 30 g 3  . losartan (COZAAR) 25 MG tablet Take 25 mg by mouth daily.    . metoCLOPramide (REGLAN) 10 MG tablet Take 1 tablet (10 mg total) by mouth 3 (three) times daily before meals. 90 tablet 0  . metoprolol tartrate (LOPRESSOR) 25 MG tablet Take 25 mg by mouth daily.    . Nutritional Supplements (FEEDING SUPPLEMENT, OSMOLITE 1.5 CAL,) LIQD Begin 1/2 can Osmolite 1.5 or equivalent via PEG QID with 120 cc free water  before and after. Increase by one can every other day to goal of 6 cans daily (1.5 cans QID) as tolerated. 1422 mL 0  . omeprazole (PRILOSEC) 2 mg/mL SUSP Place 20 mLs (40 mg total) into feeding tube daily. (Patient not taking: Reported on 11/01/2015) 600 mL 1  . ondansetron (ZOFRAN) 8 MG tablet Take 1 tablet (8 mg total) by mouth 2 (two) times daily as needed for refractory nausea / vomiting. Start on day 3 after chemo. 30 tablet 1  . prochlorperazine (COMPAZINE) 10 MG tablet Take 1 tablet (10 mg total) by mouth every 6 (six) hours as needed (Nausea or vomiting). 30 tablet 1  . senna-docusate (SENNA S) 8.6-50 MG tablet Take 2 tablets by mouth 2 (two) times daily. 120 tablet 1  . vitamin B-12 (CYANOCOBALAMIN) 1000 MCG tablet Take 1,000 mcg by mouth daily.    . Wound Dressings (SONAFINE) Apply 1 application topically 2 (two) times daily.     No current facility-administered medications for this encounter.      ALLERGIES: Symbicort [budesonide-formoterol fumarate] and Tetanus toxoids   LABORATORY DATA:  Lab Results  Component Value Date   WBC 3.0 (L) 11/05/2015   HGB 12.0 (L) 11/05/2015   HCT 35.1 (L) 11/05/2015   MCV 84.2 11/05/2015   PLT 155 11/05/2015   Lab Results  Component Value Date   NA 141 11/05/2015   K 4.4 11/05/2015   CL 104 10/14/2015   CO2 30 (H) 11/05/2015   Lab Results  Component Value Date   ALT 12 11/05/2015   AST 15 11/05/2015   ALKPHOS 53 11/05/2015   BILITOT 0.60 11/05/2015     NARRATIVE: [name] was seen today for weekly treatment management. The chart was checked and the patient's films were reviewed.  He is currently in no pain. Patient denies dysphagia, drinks liquids. Reports a high calorie diet given by nasogastric tube. He takes in formula Osmolite 1 can per day with H2O flush. Reports peg tube site is slightly tender and pink tone with small amount of yellow drainage. He reports one episode of severe leakage from his peg tube on Wednesday night,  8/9, after eating chinese food. His tube was replaced yesterday. Reports one bowel movement daily. Reports fatigue, weakness, and poor appetite. He feels good in the chest area. He is requesting medication to stimulate appetite. Notes after chemotherapy treatment he is able to eat a good meal.   PHYSICAL EXAMINATION: weight is 137 lb 11.2 oz (62.5 kg). His oral temperature is 97.4 F (36.3 C). His blood pressure is 114/70 and his pulse is 98. His respiration is 20 and oxygen saturation is 98%.      In wheelchair with nasal cannula in place. Alert, in no acute distress.   ASSESSMENT: The patient is doing satisfactorily with treatment.  PLAN: We will continue with the patient's radiation treatment as planned.     ------------------------------------------------  Jodelle Gross, MD, PhD  This document serves as a record of services personally performed by Kyung Rudd, MD. It was created on his behalf by Arlyce Harman, a trained medical scribe. The creation of this record is based on the scribe's personal observations and the provider's statements to them. This document has been checked and approved by the attending provider.

## 2015-11-08 NOTE — Progress Notes (Signed)
PAIN: He is currently in no pain.  SWALLOWING/DIET: Pt denies dysphagia, drinks liquids. Pt reports a high calorie diet given by nasogastric tube  formula-Osmolite, 1 can per day, H20 flush. Peg tube site appearance-Pt reports it is slightly tender and pink tone w small amount of yellow drainage.  Instructed on home care cleansing and caring for site BOWEL: Pt reports, a bowel movement every day. SKIN: Skin exam reveals warm dry and intact OTHER: Pt complains of fatigue, weakness and poor appetite.   WEIGHT/VS: BP 114/70   Pulse 98   Temp 97.4 F (36.3 C) (Oral)   Resp 20   Wt 137 lb 11.2 oz (62.5 kg)   SpO2 98%   BMI 22.57 kg/m  Wt Readings from Last 3 Encounters:  11/08/15 137 lb 11.2 oz (62.5 kg)  11/05/15 138 lb 6.4 oz (62.8 kg)  11/01/15 146 lb (66.2 kg)

## 2015-11-09 NOTE — Progress Notes (Signed)
[name]    HEMATOLOGY/ONCOLOGY CLINIC NOTE  Date of Service: 11/05/2015  Patient Care Team: [name], MD as PCP - General (Family Medicine) Kathee Delton, MD (Pulmonary Disease) Adrian Prows, MD as Attending Physician (Cardiology)  CHIEF COMPLAINTS/PURPOSE OF CONSULTATION:  Newly diagnosed Esophageal squamous cell carcinoma  INTERVAL HISTORY  [name] is here for his scheduled follow-up  prior to his 4th cycle of carboplatin and Taxol concurrent with chemotherapy. He continues to report some grade 1-2 fatigue. Reports poor appetite/anorexia. He report abdominal fullness even after having increased the reglan to 10mg  po ac tube feeding.  Recommended ensuring good bowel movements with prescribed laxatives. We discussed using small amounts of TF every few hours to avoid abdominal distension and discomfort. Is meeting with dietician to determine changes to TF formula or consideration of continuous TF  No significant uncontrolled pain. No fevers or chills.   MEDICAL HISTORY:  Past Medical History:  Diagnosis Date  . Anemia   . CAD (coronary artery disease)    Dr Einar Gip every 6 months  . Cardiomyopathy    EF 25% by echo 2009  . Carotid artery occlusion   . COPD (chronic obstructive pulmonary disease) ([place])    on O2 constantly  . DVT (deep venous thrombosis) ([place])   . Emphysema   . Esophageal cancer ([place])    'causes chest discomfort, liquids and solids"  . History of oxygen administration    Oxygen 3 l/m at rest / 4 l/m when active. 24/7  . Hypertension   . Keeps losing balance   . Myocardial infarction ([place]) 1986  . Peripheral vascular disease ([place])   . Pulmonary nodule    s/p wedge resction, inflammatory, +MAC  . Shortness of breath   . Vitamin B 12 deficiency     SURGICAL HISTORY: Past Surgical History:  Procedure Laterality Date  . ARCH AORTOGRAM N/A 07/21/2011   Procedure: ARCH AORTOGRAM;  Surgeon: Laverda Page, MD;  Location: [facility] CATH LAB;  Service:  Cardiovascular;  Laterality: N/A;  . CAROTID ENDARTERECTOMY  2009   triple bypass  . CAROTID-VERTEBRAL BYPASS GRAFT  08/20/2011   Procedure: BYPASS GRAFT CAROTID-VERTEBRAL;  Surgeon: Serafina Mitchell, MD;  Location: [facility] OR;  Service: Vascular;  Laterality: Right;  Right CAROTID-VERTEBRAL TRANSPOSITION  . CORONARY ARTERY BYPASS GRAFT  2009  . ESOPHAGOGASTRODUODENOSCOPY (EGD) WITH PROPOFOL N/A 09/19/2015   Procedure: ESOPHAGOGASTRODUODENOSCOPY (EGD) WITH PROPOFOL;  Surgeon: Clarene Essex, MD;  Location: [facility] ENDOSCOPY;  Service: Endoscopy;  Laterality: N/A;  . LUNG SURGERY  2008   tumor"infection"  . PR VEIN BYPASS GRAFT,AORTO-FEM-POP    . SAVORY DILATION N/A 09/19/2015   Procedure: SAVORY DILATION;  Surgeon: Clarene Essex, MD;  Location: [facility] ENDOSCOPY;  Service: Endoscopy;  Laterality: N/A;  needs carm  . VASCULAR SURGERY      SOCIAL HISTORY: Social History   Social History  . Marital status: Divorced    Spouse name: N/A  . Number of children: N/A  . Years of education: N/A   Occupational History  . Retired Retired    Charity fundraiser   Social History Main Topics  . Smoking status: Former Smoker    Packs/day: 2.00    Years: 56.00    Types: Cigarettes    Quit date: 03/30/2006  . Smokeless tobacco: Never Used  . Alcohol use 2.4 oz/week    4 Standard drinks or equivalent per week  . Drug use: No  . Sexual activity: Not on file   Other Topics Concern  . Not  on file   Social History Narrative  . No narrative on file    FAMILY HISTORY: Family History  Problem Relation Age of Onset  . Heart disease Father   . Diabetes Sister   . Anesthesia problems Neg Hx     ALLERGIES:  is allergic to symbicort [budesonide-formoterol fumarate] and tetanus toxoids.  MEDICATIONS:  Current Outpatient Prescriptions  Medication Sig Dispense Refill  . albuterol (PROAIR HFA) 108 (90 BASE) MCG/ACT inhaler Inhale 2 puffs into the lungs every 6 (six) hours as needed. For shortness of breath 1 Inhaler 2  .  albuterol (PROVENTIL) (2.5 MG/3ML) 0.083% nebulizer solution Take 3 mLs (2.5 mg total) by nebulization 4 (four) times daily as needed. For shortness of breath 75 mL 11  . aspirin 81 MG chewable tablet Chew 81 mg by mouth daily. Reported on 10/11/2015    . atorvastatin (LIPITOR) 40 MG tablet Take 40 mg by mouth daily. Reported on 10/11/2015    . cholecalciferol (VITAMIN D) 1000 units tablet Take 1,000 Units by mouth daily.    . clopidogrel (PLAVIX) 75 MG tablet Take 75 mg by mouth daily. Reported on 10/11/2015    . dexamethasone (DECADRON) 4 MG tablet Take 2 tablets (8 mg total) by mouth daily. Start the day after chemotherapy for 2 days. 30 tablet 1  . dronabinol (MARINOL) 2.5 MG capsule Take 1 capsule (2.5 mg total) by mouth 2 (two) times daily before a meal. 50 capsule 0  . folic acid (FOLVITE) 1 MG tablet Take 1 mg by mouth daily. Reported on 10/11/2015    . lidocaine-prilocaine (EMLA) cream Apply to affected area once 30 g 3  . losartan (COZAAR) 25 MG tablet Take 25 mg by mouth daily.    . metoCLOPramide (REGLAN) 10 MG tablet Take 1 tablet (10 mg total) by mouth 3 (three) times daily before meals. 90 tablet 0  . metoprolol tartrate (LOPRESSOR) 25 MG tablet Take 25 mg by mouth daily.    . Nutritional Supplements (FEEDING SUPPLEMENT, OSMOLITE 1.5 CAL,) LIQD Begin 1/2 can Osmolite 1.5 or equivalent via PEG QID with 120 cc free water before and after. Increase by one can every other day to goal of 6 cans daily (1.5 cans QID) as tolerated. 1422 mL 0  . omeprazole (PRILOSEC) 2 mg/mL SUSP Place 20 mLs (40 mg total) into feeding tube daily. (Patient not taking: Reported on 11/01/2015) 600 mL 1  . ondansetron (ZOFRAN) 8 MG tablet Take 1 tablet (8 mg total) by mouth 2 (two) times daily as needed for refractory nausea / vomiting. Start on day 3 after chemo. 30 tablet 1  . prochlorperazine (COMPAZINE) 10 MG tablet Take 1 tablet (10 mg total) by mouth every 6 (six) hours as needed (Nausea or vomiting). 30 tablet 1    . senna-docusate (SENNA S) 8.6-50 MG tablet Take 2 tablets by mouth 2 (two) times daily. 120 tablet 1  . vitamin B-12 (CYANOCOBALAMIN) 1000 MCG tablet Take 1,000 mcg by mouth daily.    . Wound Dressings (SONAFINE) Apply 1 application topically 2 (two) times daily.     No current facility-administered medications for this visit.     REVIEW OF SYSTEMS:    10 Point review of Systems was done is negative except as noted above.  PHYSICAL EXAMINATION: ECOG PERFORMANCE STATUS:2  GENERAL:alert, in no acute distress and comfortable SKIN: skin color, texture, turgor are normal, no rashes or significant lesions EYES: normal, conjunctiva are pink and non-injected, sclera clear OROPHARYNX:  Moist mucous membranes.  No mucositis  NECK: supple, no JVD, thyroid normal size, non-tender, without nodularity LYMPH:  no palpable lymphadenopathy in the cervical, axillary or inguinal LUNGS: clear to auscultation with Decreased air entry bilaterally.  HEART: regular rate & rhythm,  no murmurs and no lower extremity edema ABDOMEN: abdomen soft, non-tender, normoactive bowel sounds  Musculoskeletal: no cyanosis of digits and no clubbing  PSYCH: alert & oriented x 3 with fluent speech NEURO: no focal motor/sensory deficits  LABORATORY DATA:  I have reviewed the data as listed  . CBC Latest Ref Rng & Units 11/05/2015 10/30/2015 10/22/2015  WBC 4.0 - 10.3 10e3/uL 3.0(L) 3.4(L) 5.3  Hemoglobin 13.0 - 17.1 g/dL 12.0(L) 12.5(L) 13.3  Hematocrit 38.4 - 49.9 % 35.1(L) 37.3(L) 39.0  Platelets 140 - 400 10e3/uL 155 165 194    . CMP Latest Ref Rng & Units 11/05/2015 10/30/2015 10/22/2015  Glucose 70 - 140 mg/dl 96 106 111  BUN 7.0 - 26.0 mg/dL 26.3(H) 22.5 20.9  Creatinine 0.7 - 1.3 mg/dL 1.2 1.3 1.1  Sodium 136 - 145 mEq/L 141 140 142  Potassium 3.5 - 5.1 mEq/L 4.4 4.6 4.8  Chloride 101 - 111 mmol/L - - -  CO2 22 - 29 mEq/L 30(H) 29 31(H)  Calcium 8.4 - 10.4 mg/dL 9.6 9.7 10.0  Total Protein 6.4 - 8.3 g/dL 6.8  6.7 6.7  Total Bilirubin 0.20 - 1.20 mg/dL 0.60 0.99 0.95  Alkaline Phos 40 - 150 U/L 53 54 54  AST 5 - 34 U/L 15 16 14   ALT 0 - 55 U/L 12 12 9     RADIOGRAPHIC STUDIES:   EGD 09/19/2015  - Normal larynx. - Partially obstructing, malignant esophageal tumor was found in the lower third of the   esophagus. Biopsied. - Normal upper third of esophagus and middle third of esophagus. - The examination was otherwise normal. but unable to pass scope past the mass  I have personally reviewed the radiological images as listed and agreed with the findings in the report.  Nm Pet Image Initial (pi) Skull Base To Thigh  Result Date: 09/27/2015 CLINICAL DATA:  Initial Treatment strategy for esophageal mass. EXAM: NUCLEAR MEDICINE PET SKULL BASE TO THIGH TECHNIQUE: 7.8 mCi F-18 FDG was injected intravenously. Full-ring PET imaging was performed from the skull base to thigh after the radiotracer. CT data was obtained and used for attenuation correction and anatomic localization. FASTING BLOOD GLUCOSE:  Value: 109 mg/dl COMPARISON:  01/25/2007 FINDINGS: NECK Accentuated but relatively symmetric lymphoid activity at the tongue base and tonsillar pillars, maximum SUV 8.7. Accentuated activity along the glottis is reasonably symmetric and probably physiologic. There is some high activity in the cervical esophagus which is thought to likely be physiologic. CHEST Dilated esophagus with frothy material extending down to a distal esophageal mass which has maximum standard uptake value of 30.7 and which extends over approximately 6 cm length of the esophagus. In addition, there is a second focus of accentuated esophageal activity in the distal esophagus near the gastroesophageal junction, measuring about 2.5 cm in length, with maximum standard uptake value 14.9. Coronary, aortic arch, and branch vessel atherosclerotic vascular disease. Prior CABG. Hypermetabolic right hilar lymph node with maximum SUV 5.0. An adjacent  tiny right lower paratracheal node adjacent to the right mainstem bronchus has maximum SUV of 4.7. Subcarinal node measures 0.8 cm in short axis on image 76/4 and has a maximum SUV a 4.5. Severe emphysema. There is some scarring in the left lung, likely postoperative. ABDOMEN/PELVIS Gastrohepatic ligament lymph  node measuring 1.5 cm in short axis on image 108/4 with maximum standard uptake value 9.9. I do not observe a liver mass. Right adrenal adenoma measures 2.6 by 2.1 cm, density 6 Hounsfield units, minimal metabolic activity. Aortoiliac atherosclerotic vascular disease. Cholelithiasis noted with layering gallstones in the gallbladder. Mildly prominent prostate gland. Postoperative findings along the left groin. SKELETON No focal hypermetabolic activity to suggest skeletal metastasis. IMPRESSION: 1. 6 cm long hypermetabolic region in the distal esophagus compatible with esophageal cancer (max SUV 30.7). There is a second 2.5 cm region of hypermetabolic activity in the distal most esophagus just proximal to the gastroesophageal junction which could represent a second focus of cancer (max SUV 14.9). The there is enlarged gastrohepatic ligament lymph node which is highly hypermetabolic (max SUV 9.9), and faint hypermetabolic activity in several small mediastinal lymph nodes including a right hilar node, a small lymph node anterior to the right mainstem bronchus, and a subcarinal lymph node. 2. Other imaging findings of potential clinical significance: Coronary, aortic arch, and branch vessel atherosclerotic vascular disease. Prior CABG Aortoiliac atherosclerotic vascular disease. Severe emphysema. Scarring in the left upper lung, postoperative. Right adrenal adenoma. Cholelithiasis. Mildly prominent prostate gland. Electronically Signed   By: Van Clines M.D.   On: 09/27/2015 09:42   ASSESSMENT & PLAN:   82 year old [place] male with multiple medical comorbidities including significant coronary artery  disease status post bypass and ischemic cardiomyopathy, COPD which is oxygen dependent at 3 L/min, peripheral vascular disease status post stenting of his lower extremity arteries on multiple antiplatelet therapies now with.  #1 Stage III multifocal lower third of the esophagus invasive squamous cell carcinoma with an enlarged gastrohepatic ligament lymph node and faint hypermetabolic several small mediastinal lymph nodes including a right hilar lymph node and a subcarinal lymph node. No overt distal metastatic disease noted.   His previous heavy alcohol use and smoking are the likely etiology. He notes that he has quit heavy smoking and drinks only 2-3 beers a week since his cardiac bypass surgery in 2009. Patient has an ECOG performance status of 2  No prohibitive toxicities other than some grade 1-2 fatigue. Labs today are stable.  Plan -Patient okay to proceed with cycle 4 of carboplatin and Taxol chemotherapy concurrent with ongoing radiation. -will order additional IVF due to some element of dehydration. -infection prevention strategies.  #2 severe protein calorie malnutrition tolerating tube feeding but has symptoms of excessive gastric fullness. Likely some element of gastroparesis.  Plan -optimize treatment of constipation did not take liq senna due to cost --- switched to pills. -continue Increased Reglan to 10 mg pre-meals to help with gastroparetic symptoms. -Recommended walking for 10-15 minutes after tube feeding. -divide recommended amount of tube feeds into smaller more frequent portions -f/u with Ernestene Kiel to discussion need for continuous TF vs change in TF formula etc.  #3 CAD status post CABG, ischemic cardiomyopathy ejection fraction 25% in 2009. -Repeat echocardiogram shows improvement in ejection fraction 35-40%. Patient also noted to have severe calcific aortic stenosis with moderate to severe aortic stenosis and moderate mitral regurgitation. Plan -Patient  follows with Dr. Einar Gip for his cardiology needs.  #3 peripheral vascular disease. Has stents in his lower extremity. Currently on aspirin plus Plavix. No evidence of bleeding.  t#4 ex-smoker more than 100-pack-year history of smoking.  #5 Ex heavy alcohol use  #6 COPD with chronic hypoxic respiratory failure on 3 L/min oxygen by nasal cannula. Plan -Continue follow-up with primary care physician for ongoing management  of his multiple medical comorbidities.   RTC with Dr. Irene Limbo in 2 week with repeat labs prior to 6th cycle of carbo/taxol.  All of the patients questions were answered with apparent satisfaction. The patient knows to call the clinic with any problems, questions or concerns.  I spent 25 minutes counseling the patient face to face. The total time spent in the appointment was 25 minutes and more than 50% was on counseling and direct patient cares.    Sullivan Lone MD [place] AAHIVMS [facility] Hematology/Oncology Physician [facility]  (Office):       [phone] (Work cell):  [phone] (Fax):           [phone]

## 2015-11-11 ENCOUNTER — Ambulatory Visit
Admission: RE | Admit: 2015-11-11 | Discharge: 2015-11-11 | Disposition: A | Discharge status: Home / Self Care | Payer: Medicare Other | Source: Ambulatory Visit | Attending: Radiation Oncology | Admitting: Radiation Oncology

## 2015-11-11 DIAGNOSIS — Z510 Encounter for antineoplastic radiation therapy: Secondary | ICD-10-CM | POA: Diagnosis not present

## 2015-11-11 NOTE — Progress Notes (Signed)
Checked patient's peg tube, patient stated it was leaking yesterday and once last week, ,removed old dressing, drainage/greenish on dressing,  escoriated pinkon skin, flushed slowly with 60cc water x4, no leakage, took vitals, patient oxygen level 90% 3 liters, c/io weakness  Also didn't need to see MD , replaced drain dressing over and under peg tubing, gave extra supplies and medipore tape 3:00 PM

## 2015-11-12 ENCOUNTER — Encounter: Payer: Self-pay | Admitting: Hematology

## 2015-11-12 ENCOUNTER — Inpatient Hospital Stay
Admission: RE | Admit: 2015-11-12 | Discharge: 2015-11-12 | Disposition: A | Discharge status: Home / Self Care | Payer: Self-pay | Source: Ambulatory Visit | Attending: Radiation Oncology | Admitting: Radiation Oncology

## 2015-11-12 ENCOUNTER — Ambulatory Visit
Admission: RE | Admit: 2015-11-12 | Discharge: 2015-11-12 | Disposition: A | Discharge status: Home / Self Care | Payer: Medicare Other | Source: Ambulatory Visit | Attending: Radiation Oncology | Admitting: Radiation Oncology

## 2015-11-12 ENCOUNTER — Encounter: Payer: Self-pay | Admitting: Radiation Oncology

## 2015-11-12 VITALS — BP 112/73 | HR 112 | Temp 98.0°F | Resp 16

## 2015-11-12 DIAGNOSIS — Z510 Encounter for antineoplastic radiation therapy: Secondary | ICD-10-CM | POA: Diagnosis not present

## 2015-11-12 DIAGNOSIS — E43 Unspecified severe protein-calorie malnutrition: Secondary | ICD-10-CM

## 2015-11-12 DIAGNOSIS — C155 Malignant neoplasm of lower third of esophagus: Secondary | ICD-10-CM

## 2015-11-12 NOTE — Progress Notes (Signed)
Patient brought via w/c and oxygen to nursing after rad tx, peg tube is leaking again, was checked yesterday  At nursing with flushed water 320 cc not 240cc, no leakage then, redness around peg site still Wants to be seen

## 2015-11-12 NOTE — Progress Notes (Signed)
prior auth form left in box. sent to covermymeds

## 2015-11-12 NOTE — Progress Notes (Signed)
[name], PA-C in to see dressing changed to peg tube site.  Plans to have peg tube evaluated and will see Mr. Weingart on Friday for weekly put visit. BP 112/73 (BP Location: Left Arm, Patient Position: Sitting, Cuff Size: Normal)   Pulse (!) 112   Temp 98 F (36.7 C) (Oral)   Resp 16   SpO2 95%

## 2015-11-13 ENCOUNTER — Encounter: Payer: Self-pay | Admitting: Hematology

## 2015-11-13 ENCOUNTER — Ambulatory Visit (HOSPITAL_BASED_OUTPATIENT_CLINIC_OR_DEPARTMENT_OTHER): Payer: Medicare Other

## 2015-11-13 ENCOUNTER — Encounter: Payer: Self-pay | Admitting: *Deleted

## 2015-11-13 ENCOUNTER — Encounter: Payer: Medicare Other | Admitting: Nutrition

## 2015-11-13 ENCOUNTER — Other Ambulatory Visit (HOSPITAL_BASED_OUTPATIENT_CLINIC_OR_DEPARTMENT_OTHER): Payer: Medicare Other

## 2015-11-13 ENCOUNTER — Other Ambulatory Visit: Payer: Self-pay | Admitting: *Deleted

## 2015-11-13 ENCOUNTER — Telehealth: Payer: Self-pay | Admitting: *Deleted

## 2015-11-13 ENCOUNTER — Ambulatory Visit (HOSPITAL_COMMUNITY)
Admission: RE | Admit: 2015-11-13 | Discharge: 2015-11-13 | Disposition: A | Discharge status: Home / Self Care | Payer: Medicare Other | Source: Ambulatory Visit | Attending: Radiation Oncology | Admitting: Radiation Oncology

## 2015-11-13 ENCOUNTER — Ambulatory Visit
Admission: RE | Admit: 2015-11-13 | Discharge: 2015-11-13 | Disposition: A | Discharge status: Home / Self Care | Payer: Medicare Other | Source: Ambulatory Visit | Attending: Radiation Oncology | Admitting: Radiation Oncology

## 2015-11-13 ENCOUNTER — Other Ambulatory Visit: Payer: Self-pay | Admitting: Radiation Oncology

## 2015-11-13 ENCOUNTER — Ambulatory Visit: Payer: Medicare Other

## 2015-11-13 DIAGNOSIS — C159 Malignant neoplasm of esophagus, unspecified: Secondary | ICD-10-CM

## 2015-11-13 DIAGNOSIS — Z510 Encounter for antineoplastic radiation therapy: Secondary | ICD-10-CM | POA: Diagnosis not present

## 2015-11-13 DIAGNOSIS — E43 Unspecified severe protein-calorie malnutrition: Secondary | ICD-10-CM

## 2015-11-13 DIAGNOSIS — C155 Malignant neoplasm of lower third of esophagus: Secondary | ICD-10-CM

## 2015-11-13 DIAGNOSIS — D709 Neutropenia, unspecified: Secondary | ICD-10-CM

## 2015-11-13 LAB — COMPREHENSIVE METABOLIC PANEL
ALBUMIN: 3.1 g/dL — AB (ref 3.5–5.0)
ALK PHOS: 50 U/L (ref 40–150)
ALT: 12 U/L (ref 0–55)
ANION GAP: 13 meq/L — AB (ref 3–11)
AST: 14 U/L (ref 5–34)
BUN: 37.7 mg/dL — AB (ref 7.0–26.0)
CALCIUM: 9.4 mg/dL (ref 8.4–10.4)
CO2: 26 mEq/L (ref 22–29)
Chloride: 103 mEq/L (ref 98–109)
Creatinine: 1.3 mg/dL (ref 0.7–1.3)
EGFR: 53 mL/min/{1.73_m2} — AB (ref >90)
Glucose: 129 mg/dl (ref 70–140)
POTASSIUM: 4.5 meq/L (ref 3.5–5.1)
Sodium: 141 mEq/L (ref 136–145)
Total Bilirubin: 0.86 mg/dL (ref 0.20–1.20)
Total Protein: 6.3 g/dL — ABNORMAL LOW (ref 6.4–8.3)

## 2015-11-13 LAB — CBC & DIFF AND RETIC
BASO%: 0.6 % (ref 0.0–2.0)
BASOS ABS: 0 10*3/uL (ref 0.0–0.1)
EOS ABS: 0 10*3/uL (ref 0.0–0.5)
EOS%: 0 % (ref 0.0–7.0)
HEMATOCRIT: 34.1 % — AB (ref 38.4–49.9)
HEMOGLOBIN: 11.5 g/dL — AB (ref 13.0–17.1)
Immature Retic Fract: 2 % — ABNORMAL LOW (ref 3.00–10.60)
LYMPH%: 9.8 % — AB (ref 14.0–49.0)
MCH: 28.6 pg (ref 27.2–33.4)
MCHC: 33.7 g/dL (ref 32.0–36.0)
MCV: 84.8 fL (ref 79.3–98.0)
MONO#: 0.1 10*3/uL (ref 0.1–0.9)
MONO%: 7.3 % (ref 0.0–14.0)
NEUT#: 1.4 10*3/uL — ABNORMAL LOW (ref 1.5–6.5)
NEUT%: 82.3 % — AB (ref 39.0–75.0)
PLATELETS: 131 10*3/uL — AB (ref 140–400)
RBC: 4.02 10*6/uL — ABNORMAL LOW (ref 4.20–5.82)
RDW: 12.8 % (ref 11.0–14.6)
Retic %: <0.38 % — ABNORMAL LOW (ref 0.5–1.6)
Retic Ct Abs: 11.66 10*3/uL — ABNORMAL LOW (ref 34.80–93.90)
WBC: 1.6 10*3/uL — ABNORMAL LOW (ref 4.0–10.3)
lymph#: 0.2 10*3/uL — ABNORMAL LOW (ref 0.9–3.3)

## 2015-11-13 LAB — MAGNESIUM: MAGNESIUM: 1.6 mg/dL (ref 1.5–2.5)

## 2015-11-13 NOTE — Telephone Encounter (Signed)
CALLED PATIENT TO INFORM OF APPT. FOR PEG TUBE EVAL IN RADIOLOGY - ARRIVAL TIME - 3 PM @ [facility], SPOKE WITH PATIENT AND HE IS AWARE OF THIS APPT.

## 2015-11-13 NOTE — Patient Instructions (Signed)
Neutropenia Neutropenia is a condition that occurs when the level of a certain type of white blood cell (neutrophil) in your body becomes lower than normal. Neutrophils are made in the bone marrow and fight infections. These cells protect against bacteria and viruses. The fewer neutrophils you have, and the longer your body remains without them, the greater your risk of getting a severe infection becomes. CAUSES  The cause of neutropenia may be hard to determine. However, it is usually due to 3 main problems:   Decreased production of neutrophils. This may be due to:  Certain medicines such as chemotherapy.  Genetic problems.  Cancer.  Radiation treatments.  Vitamin deficiency.  Some pesticides.  Increased destruction of neutrophils. This may be due to:  Overwhelming infections.  Hemolytic anemia. This is when the body destroys its own blood cells.  Chemotherapy.  Neutrophils moving to areas of the body where they cannot fight infections. This may be due to:  Dialysis procedures.  Conditions where the spleen becomes enlarged. Neutrophils are held in the spleen and are not available to the rest of the body.  Overwhelming infections. The neutrophils are held in the area of the infection and are not available to the rest of the body. SYMPTOMS  There are no specific symptoms of neutropenia. The lack of neutrophils can result in an infection, and an infection can cause various problems. DIAGNOSIS  Diagnosis is made by a blood test. A complete blood count is performed. The normal level of neutrophils in human blood differs with age and race. Infants have lower counts than older children and adults. African Americans have lower counts than Caucasians or Asians. The average adult level is 1500 cells/mm3 of blood. Neutrophil counts are interpreted as follows:  Greater than 1000 cells/mm3 gives normal protection against infection.  500 to 1000 cells/mm3 gives an increased risk for  infection.  200 to 500 cells/mm3 is a greater risk for severe infection.  Lower than 200 cells/mm3 is a marked risk of infection. This may require hospitalization and treatment with antibiotic medicines. TREATMENT  Treatment depends on the underlying cause, severity, and presence of infections or symptoms. It also depends on your health. Your caregiver will discuss the treatment plan with you. Mild cases are often easily treated and have a good outcome. Preventative measures may also be started to limit your risk of infections. Treatment can include:  Taking antibiotics.  Stopping medicines that are known to cause neutropenia.  Correcting nutritional deficiencies by eating green vegetables to supply folic acid and taking vitamin B supplements.  Stopping exposure to pesticides if your neutropenia is related to pesticide exposure.  Taking a blood growth factor called sargramostim, pegfilgrastim, or filgrastim if you are undergoing chemotherapy for cancer. This stimulates white blood cell production.  Removal of the spleen if you have Felty's syndrome and have repeated infections. HOME CARE INSTRUCTIONS   Follow your caregiver's instructions about when you need to have blood work done.  Wash your hands often. Make sure others who come in contact with you also wash their hands.  Wash raw fruits and vegetables before eating them. They can carry bacteria and fungi.  Avoid people with colds or spreadable (contagious) diseases (chickenpox, herpes zoster, influenza).  Avoid large crowds.  Avoid construction areas. The dust can release fungus into the air.  Be cautious around children in daycare or school environments.  Take care of your respiratory system by coughing and deep breathing.  Bathe daily.  Protect your skin from cuts and   burns.  Do not work in the garden or with flowers and plants.  Care for the mouth before and after meals by brushing with a soft toothbrush. If you have  mucositis, do not use mouthwash. Mouthwash contains alcohol and can dry out the mouth even more.  Clean the area between the genitals and the anus (perineal area) after urination and bowel movements. Women need to wipe from front to back.  Use a water soluble lubricant during sexual intercourse and practice good hygiene after. Do not have intercourse if you are severely neutropenic. Check with your caregiver for guidelines.  Exercise daily as tolerated.  Avoid people who were vaccinated with a live vaccine in the past 30 days. You should not receive live vaccines (polio, typhoid).  Do not provide direct care for pets. Avoid animal droppings. Do not clean litter boxes and bird cages.  Do not share food utensils.  Do not use tampons, enemas, or rectal suppositories unless directed by your caregiver.  Use an electric razor to remove hair.  Wash your hands after handling magazines, letters, and newspapers. SEEK IMMEDIATE MEDICAL CARE IF:   You have a fever.  You have chills or start to shake.  You feel nauseous or vomit.  You develop mouth sores.  You develop aches and pains.  You have redness and swelling around open wounds.  Your skin is warm to the touch.  You have pus coming from your wounds.  You develop swollen lymph nodes.  You feel weak or fatigued.  You develop red streaks on the skin. MAKE SURE YOU:  Understand these instructions.  Will watch your condition.  Will get help right away if you are not doing well or get worse.   This information is not intended to replace advice given to you by your health care provider. Make sure you discuss any questions you have with your health care provider.   Document Released: 09/05/2001 Document Revised: 06/08/2011 Document Reviewed: 09/26/2014 Elsevier Interactive Patient Education 2016 [employer]  

## 2015-11-13 NOTE — Progress Notes (Signed)
[name] currently has a neutrophil count of 1.4 today.  After review of labs by Dr. Lisbeth Renshaw, Mr. Salaz was treated as ordered.

## 2015-11-13 NOTE — Progress Notes (Signed)
Reviewed pt labs (CBC) with Dr. Julien Nordmann and pt not to be treated today with ANC of 1.4 and white blood cell count of 1.6. Reviewed neutropenic precautions with pt and son including fever guidelines, good handwashing and avoiding crowds. Pt and son verbalized understanding and had no further questions. Pt vitals stable. Pt left via wheelchair on home oxygen to radiation.

## 2015-11-13 NOTE — Progress Notes (Signed)
Faxed optumrx form for prior auth marinol

## 2015-11-14 ENCOUNTER — Encounter: Payer: Self-pay | Admitting: Hematology

## 2015-11-14 ENCOUNTER — Encounter (HOSPITAL_COMMUNITY): Payer: Self-pay | Admitting: *Deleted

## 2015-11-14 ENCOUNTER — Other Ambulatory Visit: Payer: Self-pay | Admitting: Nurse Practitioner

## 2015-11-14 ENCOUNTER — Ambulatory Visit (HOSPITAL_BASED_OUTPATIENT_CLINIC_OR_DEPARTMENT_OTHER): Payer: Medicare Other | Admitting: Nurse Practitioner

## 2015-11-14 ENCOUNTER — Ambulatory Visit: Admission: RE | Admit: 2015-11-14 | Payer: Medicare Other | Source: Ambulatory Visit

## 2015-11-14 ENCOUNTER — Ambulatory Visit (HOSPITAL_COMMUNITY)
Admission: RE | Admit: 2015-11-14 | Discharge: 2015-11-14 | Disposition: A | Discharge status: Home / Self Care | Payer: Medicare Other | Source: Ambulatory Visit | Attending: Radiation Oncology | Admitting: Radiation Oncology

## 2015-11-14 ENCOUNTER — Other Ambulatory Visit: Payer: Self-pay

## 2015-11-14 ENCOUNTER — Other Ambulatory Visit (HOSPITAL_COMMUNITY): Payer: Self-pay

## 2015-11-14 ENCOUNTER — Ambulatory Visit: Payer: Medicare Other | Admitting: Nutrition

## 2015-11-14 ENCOUNTER — Emergency Department (HOSPITAL_COMMUNITY): Payer: Medicare Other

## 2015-11-14 ENCOUNTER — Ambulatory Visit: Payer: Medicare Other

## 2015-11-14 ENCOUNTER — Encounter (HOSPITAL_COMMUNITY): Payer: Self-pay | Admitting: Radiology

## 2015-11-14 ENCOUNTER — Inpatient Hospital Stay (HOSPITAL_COMMUNITY)
Admission: EM | Admit: 2015-11-14 | Discharge: 2015-11-21 | DRG: 640 | Disposition: A | Discharge status: Skilled Nursing Facility | Payer: Medicare Other | Attending: Internal Medicine | Admitting: Internal Medicine

## 2015-11-14 ENCOUNTER — Other Ambulatory Visit: Payer: Self-pay | Admitting: Radiation Oncology

## 2015-11-14 VITALS — BP 96/42 | HR 69 | Temp 97.2°F | Resp 22 | Ht 65.5 in

## 2015-11-14 DIAGNOSIS — K9423 Gastrostomy malfunction: Secondary | ICD-10-CM | POA: Diagnosis present

## 2015-11-14 DIAGNOSIS — K56609 Unspecified intestinal obstruction, unspecified as to partial versus complete obstruction: Secondary | ICD-10-CM

## 2015-11-14 DIAGNOSIS — Z888 Allergy status to other drugs, medicaments and biological substances status: Secondary | ICD-10-CM

## 2015-11-14 DIAGNOSIS — E860 Dehydration: Secondary | ICD-10-CM | POA: Diagnosis not present

## 2015-11-14 DIAGNOSIS — I429 Cardiomyopathy, unspecified: Secondary | ICD-10-CM | POA: Diagnosis present

## 2015-11-14 DIAGNOSIS — R1312 Dysphagia, oropharyngeal phase: Secondary | ICD-10-CM | POA: Diagnosis present

## 2015-11-14 DIAGNOSIS — N289 Disorder of kidney and ureter, unspecified: Secondary | ICD-10-CM | POA: Diagnosis present

## 2015-11-14 DIAGNOSIS — C155 Malignant neoplasm of lower third of esophagus: Secondary | ICD-10-CM | POA: Diagnosis not present

## 2015-11-14 DIAGNOSIS — E43 Unspecified severe protein-calorie malnutrition: Secondary | ICD-10-CM

## 2015-11-14 DIAGNOSIS — R197 Diarrhea, unspecified: Secondary | ICD-10-CM | POA: Diagnosis not present

## 2015-11-14 DIAGNOSIS — I5042 Chronic combined systolic (congestive) and diastolic (congestive) heart failure: Secondary | ICD-10-CM

## 2015-11-14 DIAGNOSIS — Z79899 Other long term (current) drug therapy: Secondary | ICD-10-CM

## 2015-11-14 DIAGNOSIS — I110 Hypertensive heart disease with heart failure: Secondary | ICD-10-CM | POA: Diagnosis present

## 2015-11-14 DIAGNOSIS — I509 Heart failure, unspecified: Secondary | ICD-10-CM

## 2015-11-14 DIAGNOSIS — R627 Adult failure to thrive: Secondary | ICD-10-CM

## 2015-11-14 DIAGNOSIS — Z833 Family history of diabetes mellitus: Secondary | ICD-10-CM

## 2015-11-14 DIAGNOSIS — R000 Tachycardia, unspecified: Secondary | ICD-10-CM | POA: Diagnosis present

## 2015-11-14 DIAGNOSIS — Z7902 Long term (current) use of antithrombotics/antiplatelets: Secondary | ICD-10-CM

## 2015-11-14 DIAGNOSIS — I2510 Atherosclerotic heart disease of native coronary artery without angina pectoris: Secondary | ICD-10-CM

## 2015-11-14 DIAGNOSIS — R112 Nausea with vomiting, unspecified: Secondary | ICD-10-CM | POA: Diagnosis not present

## 2015-11-14 DIAGNOSIS — I739 Peripheral vascular disease, unspecified: Secondary | ICD-10-CM | POA: Diagnosis present

## 2015-11-14 DIAGNOSIS — T451X5A Adverse effect of antineoplastic and immunosuppressive drugs, initial encounter: Secondary | ICD-10-CM

## 2015-11-14 DIAGNOSIS — Z6823 Body mass index (BMI) 23.0-23.9, adult: Secondary | ICD-10-CM

## 2015-11-14 DIAGNOSIS — Z951 Presence of aortocoronary bypass graft: Secondary | ICD-10-CM

## 2015-11-14 DIAGNOSIS — J449 Chronic obstructive pulmonary disease, unspecified: Secondary | ICD-10-CM | POA: Diagnosis present

## 2015-11-14 DIAGNOSIS — K219 Gastro-esophageal reflux disease without esophagitis: Secondary | ICD-10-CM

## 2015-11-14 DIAGNOSIS — R531 Weakness: Secondary | ICD-10-CM | POA: Diagnosis not present

## 2015-11-14 DIAGNOSIS — Z7982 Long term (current) use of aspirin: Secondary | ICD-10-CM

## 2015-11-14 DIAGNOSIS — D61810 Antineoplastic chemotherapy induced pancytopenia: Secondary | ICD-10-CM | POA: Diagnosis present

## 2015-11-14 DIAGNOSIS — Y833 Surgical operation with formation of external stoma as the cause of abnormal reaction of the patient, or of later complication, without mention of misadventure at the time of the procedure: Secondary | ICD-10-CM | POA: Diagnosis present

## 2015-11-14 DIAGNOSIS — E871 Hypo-osmolality and hyponatremia: Secondary | ICD-10-CM | POA: Diagnosis present

## 2015-11-14 DIAGNOSIS — I252 Old myocardial infarction: Secondary | ICD-10-CM

## 2015-11-14 DIAGNOSIS — J9611 Chronic respiratory failure with hypoxia: Secondary | ICD-10-CM | POA: Diagnosis present

## 2015-11-14 DIAGNOSIS — Z8249 Family history of ischemic heart disease and other diseases of the circulatory system: Secondary | ICD-10-CM

## 2015-11-14 DIAGNOSIS — C159 Malignant neoplasm of esophagus, unspecified: Secondary | ICD-10-CM | POA: Diagnosis present

## 2015-11-14 DIAGNOSIS — Z887 Allergy status to serum and vaccine status: Secondary | ICD-10-CM

## 2015-11-14 DIAGNOSIS — Z87891 Personal history of nicotine dependence: Secondary | ICD-10-CM

## 2015-11-14 HISTORY — PX: IR GENERIC HISTORICAL: IMG1180011

## 2015-11-14 LAB — CBC WITH DIFFERENTIAL/PLATELET
BASOS ABS: 0 10*3/uL (ref 0.0–0.1)
BASOS PCT: 1 %
Eosinophils Absolute: 0 10*3/uL (ref 0.0–0.7)
Eosinophils Relative: 0 %
HEMATOCRIT: 35.9 % — AB (ref 39.0–52.0)
HEMOGLOBIN: 12.1 g/dL — AB (ref 13.0–17.0)
LYMPHS PCT: 10 %
Lymphs Abs: 0.2 10*3/uL — ABNORMAL LOW (ref 0.7–4.0)
MCH: 28.7 pg (ref 26.0–34.0)
MCHC: 33.7 g/dL (ref 30.0–36.0)
MCV: 85.1 fL (ref 78.0–100.0)
Monocytes Absolute: 0.2 10*3/uL (ref 0.1–1.0)
Monocytes Relative: 15 %
NEUTROS ABS: 1.1 10*3/uL — AB (ref 1.7–7.7)
NEUTROS PCT: 74 %
Platelets: 130 10*3/uL — ABNORMAL LOW (ref 150–400)
RBC: 4.22 MIL/uL (ref 4.22–5.81)
RDW: 13 % (ref 11.5–15.5)
WBC: 1.5 10*3/uL — AB (ref 4.0–10.5)

## 2015-11-14 LAB — COMPREHENSIVE METABOLIC PANEL
ALBUMIN: 3.6 g/dL (ref 3.5–5.0)
ALT: 15 U/L — AB (ref 17–63)
AST: 19 U/L (ref 15–41)
Alkaline Phosphatase: 43 U/L (ref 38–126)
Anion gap: 9 (ref 5–15)
BILIRUBIN TOTAL: 1 mg/dL (ref 0.3–1.2)
BUN: 45 mg/dL — AB (ref 6–20)
CO2: 26 mmol/L (ref 22–32)
CREATININE: 1.59 mg/dL — AB (ref 0.61–1.24)
Calcium: 8.8 mg/dL — ABNORMAL LOW (ref 8.9–10.3)
Chloride: 106 mmol/L (ref 101–111)
GFR calc Af Amer: 45 mL/min — ABNORMAL LOW (ref >60)
GFR, EST NON AFRICAN AMERICAN: 39 mL/min — AB (ref >60)
GLUCOSE: 105 mg/dL — AB (ref 65–99)
POTASSIUM: 4.8 mmol/L (ref 3.5–5.1)
Sodium: 141 mmol/L (ref 135–145)
TOTAL PROTEIN: 6.4 g/dL — AB (ref 6.5–8.1)

## 2015-11-14 LAB — LACTIC ACID, PLASMA: LACTIC ACID, VENOUS: 2.1 mmol/L — AB (ref 0.5–1.9)

## 2015-11-14 LAB — LIPASE, BLOOD: Lipase: 30 U/L (ref 11–51)

## 2015-11-14 MED ORDER — SODIUM CHLORIDE 0.9 % IV SOLN
INTRAVENOUS | Status: DC
  Administered 2015-11-14: via INTRAVENOUS

## 2015-11-14 MED ORDER — SODIUM CHLORIDE 0.9 % IV SOLN
INTRAVENOUS | Status: AC
  Administered 2015-11-14: 12:00:00 via INTRAVENOUS

## 2015-11-14 MED ORDER — ALBUTEROL SULFATE (2.5 MG/3ML) 0.083% IN NEBU: 2.5000 mg | INHALATION_SOLUTION | Freq: Four times a day (QID) | RESPIRATORY_TRACT | Status: DC | PRN

## 2015-11-14 MED ORDER — ACETAMINOPHEN 325 MG PO TABS
650.0000 mg | ORAL_TABLET | Freq: Four times a day (QID) | ORAL | Status: DC | PRN
  Filled 2015-11-14: qty 2

## 2015-11-14 MED ORDER — ASPIRIN 81 MG PO CHEW
81.0000 mg | CHEWABLE_TABLET | Freq: Every day | ORAL | Status: DC
  Administered 2015-11-14 – 2015-11-21 (×6): 81 mg via ORAL
  Filled 2015-11-14 (×7): qty 1

## 2015-11-14 MED ORDER — IOPAMIDOL (ISOVUE-300) INJECTION 61%
10.0000 mL | Freq: Once | INTRAVENOUS | Status: AC | PRN
  Administered 2015-11-14: 10 mL

## 2015-11-14 MED ORDER — ONDANSETRON HCL 4 MG PO TABS: 4.0000 mg | ORAL_TABLET | Freq: Four times a day (QID) | ORAL | Status: DC | PRN

## 2015-11-14 MED ORDER — OSMOLITE 1.5 CAL PO LIQD
237.0000 mL | Freq: Four times a day (QID) | ORAL | Status: DC
  Administered 2015-11-14 – 2015-11-15 (×2): 237 mL
  Filled 2015-11-14 (×3): qty 237

## 2015-11-14 MED ORDER — MAGNESIUM SULFATE 2 GM/50ML IV SOLN
2.0000 g | Freq: Once | INTRAVENOUS | Status: AC
  Administered 2015-11-14: 2 g via INTRAVENOUS
  Filled 2015-11-14: qty 50

## 2015-11-14 MED ORDER — ONDANSETRON HCL 4 MG/2ML IJ SOLN
4.0000 mg | Freq: Four times a day (QID) | INTRAMUSCULAR | Status: DC | PRN
  Administered 2015-11-16 – 2015-11-19 (×3): 4 mg via INTRAVENOUS
  Filled 2015-11-14 (×4): qty 2

## 2015-11-14 MED ORDER — OSMOLITE 1.5 CAL PO LIQD: ORAL | 0 refills | Status: DC

## 2015-11-14 MED ORDER — FOLIC ACID 1 MG PO TABS
1.0000 mg | ORAL_TABLET | Freq: Every day | ORAL | Status: DC
  Administered 2015-11-14 – 2015-11-21 (×6): 1 mg via ORAL
  Filled 2015-11-14 (×7): qty 1

## 2015-11-14 MED ORDER — SODIUM CHLORIDE 0.9 % IV BOLUS (SEPSIS)
1000.0000 mL | Freq: Once | INTRAVENOUS | Status: AC
  Administered 2015-11-14: 1000 mL via INTRAVENOUS

## 2015-11-14 MED ORDER — METOPROLOL TARTRATE 25 MG PO TABS
25.0000 mg | ORAL_TABLET | Freq: Every day | ORAL | Status: DC
  Administered 2015-11-14 – 2015-11-21 (×6): 25 mg via ORAL
  Filled 2015-11-14 (×7): qty 1

## 2015-11-14 MED ORDER — ATORVASTATIN CALCIUM 40 MG PO TABS
40.0000 mg | ORAL_TABLET | Freq: Every day | ORAL | Status: DC
  Administered 2015-11-14 – 2015-11-21 (×6): 40 mg via ORAL
  Filled 2015-11-14 (×7): qty 1

## 2015-11-14 MED ORDER — METOCLOPRAMIDE HCL 10 MG PO TABS
10.0000 mg | ORAL_TABLET | Freq: Three times a day (TID) | ORAL | Status: DC
Start: 2015-11-15 — End: 2015-11-19
  Administered 2015-11-15 – 2015-11-18 (×9): 10 mg via ORAL
  Filled 2015-11-14 (×11): qty 1

## 2015-11-14 MED ORDER — RANITIDINE HCL 150 MG/10ML PO SYRP
150.0000 mg | ORAL_SOLUTION | Freq: Two times a day (BID) | ORAL | Status: DC
  Administered 2015-11-15 – 2015-11-21 (×9): 150 mg
  Filled 2015-11-14 (×15): qty 10

## 2015-11-14 MED ORDER — JEVITY 1.2 CAL PO LIQD: 1000.0000 mL | ORAL | Status: DC

## 2015-11-14 MED ORDER — SODIUM CHLORIDE 0.9% FLUSH
10.0000 mL | Freq: Once | INTRAVENOUS | Status: AC
  Administered 2015-11-14: 10 mL via INTRAVENOUS
  Filled 2015-11-14: qty 10

## 2015-11-14 MED ORDER — DRONABINOL 2.5 MG PO CAPS
2.5000 mg | ORAL_CAPSULE | Freq: Two times a day (BID) | ORAL | Status: DC
  Administered 2015-11-15 – 2015-11-18 (×6): 2.5 mg via ORAL
  Filled 2015-11-14 (×7): qty 1

## 2015-11-14 MED ORDER — CLOPIDOGREL BISULFATE 75 MG PO TABS
75.0000 mg | ORAL_TABLET | Freq: Every day | ORAL | Status: DC
Start: 2015-11-14 — End: 2015-11-21
  Administered 2015-11-14 – 2015-11-17 (×4): 75 mg via ORAL
  Filled 2015-11-14 (×4): qty 1

## 2015-11-14 MED ORDER — SODIUM CHLORIDE 0.9 % IV SOLN
8.0000 mg | Freq: Once | INTRAVENOUS | Status: AC
  Administered 2015-11-14: 8 mg via INTRAVENOUS
  Filled 2015-11-14: qty 4

## 2015-11-14 MED ORDER — ACETAMINOPHEN 650 MG RE SUPP: 650.0000 mg | Freq: Four times a day (QID) | RECTAL | Status: DC | PRN

## 2015-11-14 NOTE — Assessment & Plan Note (Signed)
Patient states that his PEG tube has been leaking tube feedings around the insertion site for the past several days.  He is also been experiencing frequent nausea and vomiting as well.  Patient states he has been experiencing bouts of vomiting when he tries to taking any fluids whatsoever. He states that he has had very little oral intake recently.  He states that his bowel movements have remained regular.  He feels dehydrated and weak today.  Patient's PEG tube was evaluated by interventional radiology today; and it was confirmed that the PEG tube was intact, and in the appropriate position.  This provider spoke directly to Rowe Robert, PA  in interventional radiology; and he stated that the PEG tube was "cinched up" to the PEG tube insertion site to help prevent any further leaks.  It was noted that time that there was some irritation/erythema around the insertion site; and Interventional Radiology recommended to use only one gauze pad/sponge under the PEG tube disc to prevent any further irritation.  Patient received IV fluid rehydration while at the [facility] today; but was still unable to take in any fluids by mouth while at the cancer center.  Reviewed all findings with Dr. Julien Nordmann; and decision was made to transport patient to the emergency department for further evaluation and management of patient's recent onset nausea/vomiting and subsequent dehydration.  Differential diagnosis would include possible bowel obstruction.  Brief history.  Report were called to the emergency department charge nurse; prior to the patient being transported to the emergency department via wheelchair.  Per the cancer Center nurse.  CODE STATUS: Patient should be considered a full code; since there are no advanced directives in the patient's chart.

## 2015-11-14 NOTE — ED Notes (Signed)
Bed: WA20 Expected date:  Expected time:  Means of arrival:  Comments: EMS - cancer

## 2015-11-14 NOTE — Progress Notes (Signed)
prior auth form left in box. sent to covermymeds- per optumrx approved thru 03/29/16 under med part d-sent to medical records

## 2015-11-14 NOTE — Patient Instructions (Signed)

## 2015-11-14 NOTE — ED Notes (Signed)
Report given to Al, RN.  

## 2015-11-14 NOTE — Progress Notes (Signed)
I spoke with patient's son and patient who is being treated for esophageal cancer. Patient is weak and dehydrated. He is receiving IV fluids today. Weight has decreased and was documented as 137.7 pounds August 11. Continues to report tube feedings are not well tolerated. Patient is only using approximately one can of Osmolite 1.5 daily continues to report fullness. Reports feeding tube is also leaking and plans to have this evaluated today.  Nutrition diagnosis: Severe malnutrition continues.  Estimated nutrition needs: 1900-2100 calories, 82-95 grams protein, greater than 2 L fluid.  Intervention: Patient has finally agreed to utilize continuous feedings.  I will place an order and notify advanced homecare to begin Osmolite 1.5 at 20 mL an hour and increase 10 cc every day until goal rate reaches 60 cc/hour. Patient should flush feeding tube with 180 mL free water 6 times a day. Oral intake as tolerated and safe.  Patient is at risk for refeeding syndrome so recommend CMET, magnesium, and phosphorus twice weekly once tube feeding is beginning to advance.  Monitoring, evaluation, goals:  Patient will comply with continuous tube feeding advancement and tolerate feedings for weight gain.  Next visit: To be scheduled as needed.  Follow-up has been scheduled on Wednesday, August 23.  **Disclaimer: This note was dictated with voice recognition software. Similar sounding words can inadvertently be transcribed and this note may contain transcription errors which may not have been corrected upon publication of note.**

## 2015-11-14 NOTE — ED Provider Notes (Signed)
[facility] DEPT Provider Note   CSN: [id number] Arrival date & time: 11/14/15  1614     History   Chief Complaint Chief Complaint  Patient presents with  . vomiting and possible obstruction    HPI [name] is a 82 y.o. male.  Patient states that he feels extremely weak. He has esophageal cancer and was seen by his oncologist yesterday who decided not to give the chemotherapy because he was weak but he did get the radiation. He was seen again today and was given 1 L of fluids then sent over here for continued weakness and leukopenia    Weakness  This is a recurrent problem. The current episode started more than 2 days ago. The problem occurs constantly. The problem has not changed since onset.Pertinent negatives include no chest pain, no abdominal pain and no headaches. Nothing aggravates the symptoms. Nothing relieves the symptoms. He has tried nothing for the symptoms. The treatment provided no relief.    Past Medical History:  Diagnosis Date  . Anemia   . CAD (coronary artery disease)    Dr Einar Gip every 6 months  . Cardiomyopathy    EF 25% by echo 2009  . Carotid artery occlusion   . COPD (chronic obstructive pulmonary disease) ([place])    on O2 constantly  . DVT (deep venous thrombosis) ([place])   . Emphysema   . Esophageal cancer ([place])    'causes chest discomfort, liquids and solids"  . History of oxygen administration    Oxygen 3 l/m at rest / 4 l/m when active. 24/7  . Hypertension   . Keeps losing balance   . Myocardial infarction ([place]) 1986  . Peripheral vascular disease ([place])   . Pulmonary nodule    s/p wedge resction, inflammatory, +MAC  . Shortness of breath   . Vitamin B 12 deficiency     Patient Active Problem List   Diagnosis Date Noted  . Nausea with vomiting 11/14/2015  . PEG tube malfunction ([place]) 11/14/2015  . Severe protein-calorie malnutrition ([place]) 10/09/2015  . Unintentional weight loss 10/04/2015  . Dehydration 10/04/2015  . Cancer of  lower third of esophagus ([place]) 09/23/2015  . Occlusion and stenosis of carotid artery without mention of cerebral infarction 09/07/2011  . Stricture of artery ([place]) 08/10/2011  . Chronic respiratory failure with hypoxia ([place]) 10/21/2007  . COPD  GOLD III  03/16/2007  . PULMONARY NODULE 03/16/2007    Past Surgical History:  Procedure Laterality Date  . ARCH AORTOGRAM N/A 07/21/2011   Procedure: ARCH AORTOGRAM;  Surgeon: Laverda Page, MD;  Location: [facility] CATH LAB;  Service: Cardiovascular;  Laterality: N/A;  . CAROTID ENDARTERECTOMY  2009   triple bypass  . CAROTID-VERTEBRAL BYPASS GRAFT  08/20/2011   Procedure: BYPASS GRAFT CAROTID-VERTEBRAL;  Surgeon: Serafina Mitchell, MD;  Location: [facility] OR;  Service: Vascular;  Laterality: Right;  Right CAROTID-VERTEBRAL TRANSPOSITION  . CORONARY ARTERY BYPASS GRAFT  2009  . ESOPHAGOGASTRODUODENOSCOPY (EGD) WITH PROPOFOL N/A 09/19/2015   Procedure: ESOPHAGOGASTRODUODENOSCOPY (EGD) WITH PROPOFOL;  Surgeon: Clarene Essex, MD;  Location: [facility] ENDOSCOPY;  Service: Endoscopy;  Laterality: N/A;  . IR GENERIC HISTORICAL  11/14/2015   IR CM INJ ANY COLONIC TUBE W/FLUORO 11/14/2015 Darrell K Allred, PA-C [facility]-INTERV RAD  . LUNG SURGERY  2008   tumor"infection"  . PR VEIN BYPASS GRAFT,AORTO-FEM-POP    . SAVORY DILATION N/A 09/19/2015   Procedure: SAVORY DILATION;  Surgeon: Clarene Essex, MD;  Location: [facility] ENDOSCOPY;  Service: Endoscopy;  Laterality: N/A;  needs  carm  . VASCULAR SURGERY         Home Medications    Prior to Admission medications   Medication Sig Start Date End Date Taking? Authorizing Provider  albuterol (PROVENTIL) (2.5 MG/3ML) 0.083% nebulizer solution Take 3 mLs (2.5 mg total) by nebulization 4 (four) times daily as needed. For shortness of breath 04/05/15  Yes Tanda Rockers, MD  aspirin 81 MG chewable tablet Chew 81 mg by mouth daily. Reported on 10/11/2015   Yes Historical Provider, MD  atorvastatin (LIPITOR) 40 MG tablet Take 40 mg by mouth daily.  Reported on 10/11/2015   Yes Historical Provider, MD  cholecalciferol (VITAMIN D) 1000 units tablet Take 1,000 Units by mouth daily.   Yes Historical Provider, MD  clopidogrel (PLAVIX) 75 MG tablet Take 75 mg by mouth daily. Reported on 10/11/2015   Yes Historical Provider, MD  dexamethasone (DECADRON) 4 MG tablet Take 2 tablets (8 mg total) by mouth daily. Start the day after chemotherapy for 2 days. 10/03/15  Yes Brunetta Genera, MD  dronabinol (MARINOL) 2.5 MG capsule Take 1 capsule (2.5 mg total) by mouth 2 (two) times daily before a meal. 11/05/15  Yes Brunetta Genera, MD  folic acid (FOLVITE) 1 MG tablet Take 1 mg by mouth daily. Reported on 10/11/2015   Yes Historical Provider, MD  losartan (COZAAR) 25 MG tablet Take 25 mg by mouth daily.   Yes Historical Provider, MD  metoCLOPramide (REGLAN) 10 MG tablet Take 1 tablet (10 mg total) by mouth 3 (three) times daily before meals. 10/30/15  Yes Brunetta Genera, MD  metoprolol tartrate (LOPRESSOR) 25 MG tablet Take 25 mg by mouth daily. 08/26/15  Yes Historical Provider, MD  ondansetron (ZOFRAN) 8 MG tablet Take 1 tablet (8 mg total) by mouth 2 (two) times daily as needed for refractory nausea / vomiting. Start on day 3 after chemo. 10/03/15  Yes Brunetta Genera, MD  senna-docusate (SENNA S) 8.6-50 MG tablet Take 2 tablets by mouth 2 (two) times daily. 11/05/15  Yes Brunetta Genera, MD  vitamin B-12 (CYANOCOBALAMIN) 1000 MCG tablet Take 1,000 mcg by mouth daily.   Yes Historical Provider, MD  albuterol (PROAIR HFA) 108 (90 BASE) MCG/ACT inhaler Inhale 2 puffs into the lungs every 6 (six) hours as needed. For shortness of breath 12/10/14   Tanda Rockers, MD  lidocaine-prilocaine (EMLA) cream Apply to affected area once Patient not taking: Reported on 11/14/2015 10/03/15   Brunetta Genera, MD  metoCLOPramide (REGLAN) 5 MG tablet Take 5 mg by mouth every 6 (six) hours as needed for nausea or vomiting.  10/23/15   Historical Provider, MD    Nutritional Supplements (FEEDING SUPPLEMENT, OSMOLITE 1.5 CAL,) LIQD Change Osmolite 1.5 to 20 cc an hour continuous feeding via PEG. Increase 10 cc daily until goal rate of 60 cc per hour. Flush feeding tube with 180 cc free water 6 times a day.  Please send pump and supplies to patient's home. Patient not taking: Reported on 11/14/2015 11/14/15   Brunetta Genera, MD  omeprazole (PRILOSEC) 2 mg/mL SUSP Place 20 mLs (40 mg total) into feeding tube daily. Patient not taking: Reported on 11/01/2015 10/30/15   Brunetta Genera, MD  prochlorperazine (COMPAZINE) 10 MG tablet Take 1 tablet (10 mg total) by mouth every 6 (six) hours as needed (Nausea or vomiting). 10/03/15   Brunetta Genera, MD  Wound Dressings (SONAFINE) Apply 1 application topically 2 (two) times daily. 10/09/15   Kyung Rudd,  MD    Family History Family History  Problem Relation Age of Onset  . Heart disease Father   . Diabetes Sister   . Anesthesia problems Neg Hx     Social History Social History  Substance Use Topics  . Smoking status: Former Smoker    Packs/day: 2.00    Years: 56.00    Types: Cigarettes    Quit date: 03/30/2006  . Smokeless tobacco: Never Used  . Alcohol use 2.4 oz/week    4 Standard drinks or equivalent per week     Allergies   Symbicort [budesonide-formoterol fumarate] and Tetanus toxoids   Review of Systems Review of Systems  Constitutional: Positive for fatigue. Negative for appetite change.  HENT: Negative for congestion, ear discharge and sinus pressure.   Eyes: Negative for discharge.  Respiratory: Negative for cough.   Cardiovascular: Negative for chest pain.  Gastrointestinal: Negative for abdominal pain and diarrhea.  Genitourinary: Negative for frequency and hematuria.  Musculoskeletal: Negative for back pain.  Skin: Negative for rash.  Neurological: Positive for weakness. Negative for seizures and headaches.  Psychiatric/Behavioral: Negative for hallucinations.      Physical Exam Updated Vital Signs BP 134/78 (BP Location: Left Arm)   Pulse 110   Temp 97.5 F (36.4 C) (Oral)   Resp 20   Ht 5\' 5"  (1.651 m)   Wt 140 lb (63.5 kg)   SpO2 93%   BMI 23.30 kg/m   Physical Exam  Constitutional: He is oriented to person, place, and time. He appears well-developed.  HENT:  Head: Normocephalic.  Mucous membranes dry  Eyes: Conjunctivae and EOM are normal. No scleral icterus.  Neck: Neck supple. No thyromegaly present.  Cardiovascular: Regular rhythm.  Exam reveals no gallop and no friction rub.   No murmur heard. Tachycardia  Pulmonary/Chest: No stridor. He has no wheezes. He has no rales. He exhibits no tenderness.  Abdominal: He exhibits no distension. There is no tenderness. There is no rebound.  Patient has a PEG tube  Musculoskeletal: Normal range of motion. He exhibits no edema.  Lymphadenopathy:    He has no cervical adenopathy.  Neurological: He is oriented to person, place, and time. He exhibits normal muscle tone. Coordination normal.  Skin: No rash noted. No erythema.  Psychiatric: He has a normal mood and affect. His behavior is normal.     ED Treatments / Results  Labs (all labs ordered are listed, but only abnormal results are displayed) Labs Reviewed  CBC WITH DIFFERENTIAL/PLATELET - Abnormal; Notable for the following:       Result Value   WBC 1.5 (*)    Hemoglobin 12.1 (*)    HCT 35.9 (*)    Platelets 130 (*)    Neutro Abs 1.1 (*)    Lymphs Abs 0.2 (*)    All other components within normal limits  COMPREHENSIVE METABOLIC PANEL - Abnormal; Notable for the following:    Glucose, Bld 105 (*)    BUN 45 (*)    Creatinine, Ser 1.59 (*)    Calcium 8.8 (*)    Total Protein 6.4 (*)    ALT 15 (*)    GFR calc non Af Amer 39 (*)    GFR calc Af Amer 45 (*)    All other components within normal limits  LACTIC ACID, PLASMA - Abnormal; Notable for the following:    Lactic Acid, Venous 2.1 (*)    All other components  within normal limits  LIPASE, BLOOD    EKG  EKG Interpretation None       Radiology Ir Cm Inj Any Colonic Tube W/fluoro  Result Date: 11/14/2015 INDICATION: Malnutrition, gastrostomy, leakage at the skin site EXAM: GI TUBE INJECTION MEDICATIONS: None. ANESTHESIA/SEDATION: None. CONTRAST:  10 cc Isovue - administered into the gastric lumen. FLUOROSCOPY TIME:  Fluoroscopy Time:  6 seconds (1 mGy). COMPLICATIONS: None immediate. PROCEDURE: Under fluoroscopy, the existing gastrostomy was injected with contrast. This confirms position in the stomach and tube patency. No leakage or extravasation. No discontinuity. Gastrostomy was secured externally. The dressing was exchange. Mild erythema at the site secondary to skin irritation. IMPRESSION: Patent gastrostomy tube within the stomach. This was re- secured externally to prevent further leakage. Feeding tube ready for use. Electronically Signed   By: Jerilynn Mages.  Shick M.D.   On: 11/14/2015 15:16   Dg Abd Acute W/chest  Result Date: 11/14/2015 CLINICAL DATA:  Esophageal cancer, weakness, shortness of breath, dehydration, former smoker, coronary artery disease post MI, hypertension, COPD/emphysema EXAM: DG ABDOMEN ACUTE W/ 1V CHEST COMPARISON:  Chest radiograph 05/17/2015 FINDINGS: RIGHT jugular Port-A-Cath with tip projecting over SVC. Minimal enlargement of cardiac silhouette post CABG. Atherosclerotic calcification aorta. Mediastinal contours normal. Emphysematous changes consistent with COPD. Postsurgical changes in the LEFT perihilar region. Bibasilar atelectasis. No definite infiltrate, pleural effusion or pneumothorax. Nonobstructive bowel gas pattern. No bowel dilatation or bowel wall thickening, or free air. Scattered radiodense tearing small bowel and colon. Probable bladder dysphagia. Diffuse osseous demineralization. IMPRESSION: Post CABG. COPD changes with bibasilar atelectasis. No acute abdominal findings. Aortic atherosclerosis. Electronically Signed    By: Lavonia Dana M.D.   On: 11/14/2015 17:10    Procedures Procedures (including critical care time)  Medications Ordered in ED Medications  sodium chloride 0.9 % bolus 1,000 mL (1,000 mLs Intravenous New Bag/Given 11/14/15 1726)     Initial Impression / Assessment and Plan / ED Course  I have reviewed the triage vital signs and the nursing notes.  Pertinent labs & imaging results that were available during my care of the patient were reviewed by me and considered in my medical decision making (see chart for details).  Clinical Course    Patient will be admitted to obs for dehydration and leukopenia  Final Clinical Impressions(s) / ED Diagnoses   Final diagnoses:  Dehydration    New Prescriptions New Prescriptions   No medications on file     Milton Ferguson, MD 11/14/15 1928

## 2015-11-14 NOTE — H&P (Addendum)
History and Physical    [name] [id number] DOB: 1931-11-30 DOA: 11/14/2015  PCP: [name], MD   Patient coming from: Oncology clinic  Chief Complaint: Fatigue, weight loss, dehydration  HPI: [name] is a 82 y.o. gentleman with a history of esophageal cancer (diagnosed earlier this year, not a surgical candidate, active chemotherapy and radiation treatments), CAD S/P CABG, combined systolic and diastolic heart failure (EF 35-40% and grade I diastolic dysfunction on last echo), PVD, and prior DVT who was referred to the ED from oncology clinic for evaluation of weakness and adult failure to thrive.  He is S/P four cycles of carboplatin and taxol; fifth cycle has been deferred due to his deconditioned stated.  He denies fever.  He has had a chronic cough productive of clear sputum/white sputum which is unchanged.  No hemoptysis.  He denies chest pain, pressure, or tightness.  He reports intermittent heart burn.  He has chronic shortness of breath which he relates to a prior lung surgery for a benign lung mass.  He wears 3L [place] at baseline which is chronic and unchanged.  He has had intermittent light-headedness but no syncope or LOC.  No abdominal pain.  Stools have been loose since starting tube feedings.  No dysuria.  He denies blood in his urine or stool.  Of note, he has a gastrostomy tube for primary nutrition (he takes very little by mouth anymore), and he has had recurrent problems with leaking and tube malfunction.  He also reports that he has had difficulty tolerating the recommended amount of daily tube feedings to meet his nutritional needs (should be on Osmolite 1.5, 6 cans daily with 120cc of free water with each can).  He has not been getting the recommended amount of free water either.  ED Course: The patient has leukopenia but his absolute neutrophil count is greater than 1000.  Lactic acid level 2.1 but no source of infection has been identified.  He does not appear to be  septic.  Hgb and platelet counts are also low but stable.  He has evidence of progressive elevations of his BUN and creatinine levels, suggestive of dehydration.  EKG shows a sinus tachycardia.  He received 2L of NS between the clinic and the ED.  Hospitalist asked to admit for observation.  Review of Systems: No swelling, no falls, no trauma.  Otherwise, 12 systems reviewed and negative except as stated in the HPI.      Past Medical History:  Diagnosis Date  . Anemia   . CAD (coronary artery disease)    Dr Einar Gip every 6 months  . Cardiomyopathy    EF 25% by echo 2009  . Carotid artery occlusion   . COPD (chronic obstructive pulmonary disease) ([place])    on O2 constantly  . DVT (deep venous thrombosis) ([place])   . Emphysema   . Esophageal cancer ([place])    'causes chest discomfort, liquids and solids"  . History of oxygen administration    Oxygen 3 l/m at rest / 4 l/m when active. 24/7  . Hypertension   . Keeps losing balance   . Myocardial infarction ([place]) 1986  . Peripheral vascular disease ([place])   . Pulmonary nodule    s/p wedge resction, inflammatory, +MAC  . Shortness of breath   . Vitamin B 12 deficiency     Past Surgical History:  Procedure Laterality Date  . ARCH AORTOGRAM N/A 07/21/2011   Procedure: ARCH AORTOGRAM;  Surgeon: Laverda Page, MD;  Location: [facility]  CATH LAB;  Service: Cardiovascular;  Laterality: N/A;  . CAROTID ENDARTERECTOMY  2009   triple bypass  . CAROTID-VERTEBRAL BYPASS GRAFT  08/20/2011   Procedure: BYPASS GRAFT CAROTID-VERTEBRAL;  Surgeon: Serafina Mitchell, MD;  Location: [facility] OR;  Service: Vascular;  Laterality: Right;  Right CAROTID-VERTEBRAL TRANSPOSITION  . CORONARY ARTERY BYPASS GRAFT  2009  . ESOPHAGOGASTRODUODENOSCOPY (EGD) WITH PROPOFOL N/A 09/19/2015   Procedure: ESOPHAGOGASTRODUODENOSCOPY (EGD) WITH PROPOFOL;  Surgeon: Clarene Essex, MD;  Location: [facility] ENDOSCOPY;  Service: Endoscopy;  Laterality: N/A;  . IR GENERIC HISTORICAL  11/14/2015   IR CM INJ  ANY COLONIC TUBE W/FLUORO 11/14/2015 Darrell K Allred, PA-C [facility]-INTERV RAD  . LUNG SURGERY  2008   tumor"infection"  . PR VEIN BYPASS GRAFT,AORTO-FEM-POP    . SAVORY DILATION N/A 09/19/2015   Procedure: SAVORY DILATION;  Surgeon: Clarene Essex, MD;  Location: [facility] ENDOSCOPY;  Service: Endoscopy;  Laterality: N/A;  needs carm  . VASCULAR SURGERY      SOCIAL HISTORY: No active tobacco, EtOH, or illicit drug use.  He lives with his son, who is his next of kin.  Allergies  Allergen Reactions  . Symbicort [Budesonide-Formoterol Fumarate] Nausea And Vomiting  . Tetanus Toxoids Other (See Comments)    In 1958 he got the tetanus shot and "blacked out"    Family History  Problem Relation Age of Onset  . Heart disease Father   . Diabetes Sister   . Anesthesia problems Neg Hx      Prior to Admission medications   Medication Sig Start Date End Date Taking? Authorizing Provider  albuterol (PROVENTIL) (2.5 MG/3ML) 0.083% nebulizer solution Take 3 mLs (2.5 mg total) by nebulization 4 (four) times daily as needed. For shortness of breath 04/05/15  Yes Tanda Rockers, MD  aspirin 81 MG chewable tablet Chew 81 mg by mouth daily. Reported on 10/11/2015   Yes Historical Provider, MD  atorvastatin (LIPITOR) 40 MG tablet Take 40 mg by mouth daily. Reported on 10/11/2015   Yes Historical Provider, MD  cholecalciferol (VITAMIN D) 1000 units tablet Take 1,000 Units by mouth daily.   Yes Historical Provider, MD  clopidogrel (PLAVIX) 75 MG tablet Take 75 mg by mouth daily. Reported on 10/11/2015   Yes Historical Provider, MD  dexamethasone (DECADRON) 4 MG tablet Take 2 tablets (8 mg total) by mouth daily. Start the day after chemotherapy for 2 days. 10/03/15  Yes Brunetta Genera, MD  dronabinol (MARINOL) 2.5 MG capsule Take 1 capsule (2.5 mg total) by mouth 2 (two) times daily before a meal. 11/05/15  Yes Brunetta Genera, MD  folic acid (FOLVITE) 1 MG tablet Take 1 mg by mouth daily. Reported on 10/11/2015   Yes  Historical Provider, MD  losartan (COZAAR) 25 MG tablet Take 25 mg by mouth daily.   Yes Historical Provider, MD  metoCLOPramide (REGLAN) 10 MG tablet Take 1 tablet (10 mg total) by mouth 3 (three) times daily before meals. 10/30/15  Yes Brunetta Genera, MD  metoprolol tartrate (LOPRESSOR) 25 MG tablet Take 25 mg by mouth daily. 08/26/15  Yes Historical Provider, MD  ondansetron (ZOFRAN) 8 MG tablet Take 1 tablet (8 mg total) by mouth 2 (two) times daily as needed for refractory nausea / vomiting. Start on day 3 after chemo. 10/03/15  Yes Brunetta Genera, MD  senna-docusate (SENNA S) 8.6-50 MG tablet Take 2 tablets by mouth 2 (two) times daily. 11/05/15  Yes Gautam Juleen China, MD  vitamin B-12 (CYANOCOBALAMIN) 1000 MCG tablet Take  1,000 mcg by mouth daily.   Yes Historical Provider, MD  albuterol (PROAIR HFA) 108 (90 BASE) MCG/ACT inhaler Inhale 2 puffs into the lungs every 6 (six) hours as needed. For shortness of breath 12/10/14   Tanda Rockers, MD  metoCLOPramide (REGLAN) 5 MG tablet Take 5 mg by mouth every 6 (six) hours as needed for nausea or vomiting.  10/23/15   Historical Provider, MD  prochlorperazine (COMPAZINE) 10 MG tablet Take 1 tablet (10 mg total) by mouth every 6 (six) hours as needed (Nausea or vomiting). 10/03/15   Brunetta Genera, MD  Wound Dressings (SONAFINE) Apply 1 application topically 2 (two) times daily. 10/09/15   Kyung Rudd, MD    Physical Exam: Vitals:   11/14/15 1624 11/14/15 1951 11/14/15 2024 11/14/15 2303  BP:  129/75 123/74 115/71  Pulse:  105 (!) 110 (!) 106  Resp:  17 18 18   Temp:  97.6 F (36.4 C) 97.9 F (36.6 C) 97.9 F (36.6 C)  TempSrc:  Oral Oral Oral  SpO2:  96% 100% 98%  Weight: 63.5 kg (140 lb)  63.5 kg (140 lb)   Height: 5\' 5"  (1.651 m)  5\' 5"  (1.651 m)       Constitutional: NAD, calm, comfortable, chronically ill appearing gentleman in no acute distress Vitals:   11/14/15 1624 11/14/15 1951 11/14/15 2024 11/14/15 2303  BP:  129/75  123/74 115/71  Pulse:  105 (!) 110 (!) 106  Resp:  17 18 18   Temp:  97.6 F (36.4 C) 97.9 F (36.6 C) 97.9 F (36.6 C)  TempSrc:  Oral Oral Oral  SpO2:  96% 100% 98%  Weight: 63.5 kg (140 lb)  63.5 kg (140 lb)   Height: 5\' 5"  (1.651 m)  5\' 5"  (1.651 m)    Eyes: PERRL, lids and conjunctivae normal ENMT: Mucous membranes are dry. Posterior pharynx clear of any exudate or lesions. Normal dentition.  Neck: normal appearance, supple Respiratory: clear to auscultation bilaterally, no wheezing, no crackles. Normal respiratory effort. No accessory muscle use.  Cardiovascular: Mildly tachycardic but regular.  No murmurs / rubs / gallops. No extremity edema. 2+ pedal pulses. GI: abdomen is soft and compressible.  No distention.  No tenderness.  No masses palpated.  Bowel sounds are present.  PEG tube in place, dressing C/D/I. Musculoskeletal:  No joint deformity in upper and lower extremities. Good ROM, no contractures. Normal muscle tone.  Skin: no rashes, warm and dry Neurologic: CN 2-12 grossly intact. Sensation intact, Strength symmetric bilaterally, 5/5  Psychiatric: Normal judgment and insight. Alert and oriented x 3. Normal mood.     Labs on Admission: I have personally reviewed following labs and imaging studies  CBC:  Recent Labs Lab 11/13/15 1005 11/14/15 1658  WBC 1.6* 1.5*  NEUTROABS 1.4* 1.1*  HGB 11.5* 12.1*  HCT 34.1* 35.9*  MCV 84.8 85.1  PLT 131* [id number]*   Basic Metabolic Panel:  Recent Labs Lab 11/13/15 1005 11/14/15 1658  NA 141 141  K 4.5 4.8  CL  --  106  CO2 26 26  GLUCOSE 129 105*  BUN 37.7* 45*  CREATININE 1.3 1.59*  CALCIUM 9.4 8.8*  MG 1.6  --    GFR: Estimated Creatinine Clearance: 31.2 mL/min (by C-G formula based on SCr of 1.59 mg/dL). Liver Function Tests:  Recent Labs Lab 11/13/15 1005 11/14/15 1658  AST 14 19  ALT 12 15*  ALKPHOS 50 43  BILITOT 0.86 1.0  PROT 6.3* 6.4*  ALBUMIN 3.1* 3.6  Recent Labs Lab 11/14/15 1703    LIPASE 30   Anemia Panel:  Recent Labs  11/13/15 1005  RETICCTPCT <0.38*   Sepsis Labs:  Lactic acid level 2.1, repeat pending  Radiological Exams on Admission: Ir Cm Inj Any Colonic Tube W/fluoro  Result Date: 11/14/2015 INDICATION: Malnutrition, gastrostomy, leakage at the skin site EXAM: GI TUBE INJECTION MEDICATIONS: None. ANESTHESIA/SEDATION: None. CONTRAST:  10 cc Isovue - administered into the gastric lumen. FLUOROSCOPY TIME:  Fluoroscopy Time:  6 seconds (1 mGy). COMPLICATIONS: None immediate. PROCEDURE: Under fluoroscopy, the existing gastrostomy was injected with contrast. This confirms position in the stomach and tube patency. No leakage or extravasation. No discontinuity. Gastrostomy was secured externally. The dressing was exchange. Mild erythema at the site secondary to skin irritation. IMPRESSION: Patent gastrostomy tube within the stomach. This was re- secured externally to prevent further leakage. Feeding tube ready for use. Electronically Signed   By: Jerilynn Mages.  Shick M.D.   On: 11/14/2015 15:16   Dg Abd Acute W/chest  Result Date: 11/14/2015 CLINICAL DATA:  Esophageal cancer, weakness, shortness of breath, dehydration, former smoker, coronary artery disease post MI, hypertension, COPD/emphysema EXAM: DG ABDOMEN ACUTE W/ 1V CHEST COMPARISON:  Chest radiograph 05/17/2015 FINDINGS: RIGHT jugular Port-A-Cath with tip projecting over SVC. Minimal enlargement of cardiac silhouette post CABG. Atherosclerotic calcification aorta. Mediastinal contours normal. Emphysematous changes consistent with COPD. Postsurgical changes in the LEFT perihilar region. Bibasilar atelectasis. No definite infiltrate, pleural effusion or pneumothorax. Nonobstructive bowel gas pattern. No bowel dilatation or bowel wall thickening, or free air. Scattered radiodense tearing small bowel and colon. Probable bladder dysphagia. Diffuse osseous demineralization. IMPRESSION: Post CABG. COPD changes with bibasilar  atelectasis. No acute abdominal findings. Aortic atherosclerosis. Electronically Signed   By: Lavonia Dana M.D.   On: 11/14/2015 17:10    EKG: Independently reviewed. Sinus tachycardia, HR 104.  Assessment/Plan Active Problems:   Chronic respiratory failure with hypoxia (HCC)   Dehydration   Severe protein-calorie malnutrition (HCC)   PEG tube malfunction (HCC)   Adult failure to thrive   Antineoplastic chemotherapy induced pancytopenia (HCC)   Hypomagnesemia   CHF (congestive heart failure) (HCC)   CAD (coronary artery disease)   Acid reflux      Esophageal cancer patient, actively undergoing chemotherapy and radiation treatments, who presents with pancytopenia and dehydration.  Admits that intake through his PEG has been inadequate due to recurrent problems with the functioning of the tube. --Somewhat improved after IV fluids.  Will continue hydration cautiously overnight due to history of CHF. So far, he appears compensated. --Will check urinalysis --Will need to contact Radiation Oncology in the morning for daily radiation treatment --Will need to contact his oncologist in the morning re: admission and recommendations for pancytopenia.  I suspect this is chemo induced.  ?Is he a candidate for neulasta?  Timing of further chemotherapy to be determined. --PT eval and treat --Nutrition consult placed.  Osmolite 1 can QID ordered for now --Pepcid BID for reflux symptoms  Hypomagnesemia (borderline) --IV replacement ordered x 1  CAD --Continue aspirin, plavix, BB, statin for now.  Hold ARB due to renal insufficiency.  Combined CHF, compensated at this point --Check BNP in the AM     DVT prophylaxis: SCDs Code Status: FULL Family Communication: Spoke to patient's son by phone, number listed on Facesheet Disposition Plan: To be determined Consults called: NONE.  Will need to notify radiation oncology and oncology in the AM Admission status: Observation, med surg   TIME  SPENT: 70  minutes   Eber Jones MD [facility] Pager [phone]  If 7PM-7AM, please contact night-coverage [email or web address] Password [facility]  11/14/2015, 11:16 PM

## 2015-11-14 NOTE — Progress Notes (Signed)
Pt completed 1 liter of fluid by 1400 and was transported to [facility] for PEG tube evaluation as son states it leaks. Pt to return to [facility] after.   Transported to Radiology via w/c and [place]    Chief Complaint: Nausea, vomiting, dehydration  HPI:  [name] 82 y.o. male diagnosed with esophageal cancer.  Currently undergoing carboplatin/Taxol chemotherapy regimen.  Patient presents to the [facility] today with complaint of nausea, vomiting, and dehydration.  He feels very weak today as well.  He has had very little oral intake.  He denies any recent fevers or chills.  He also denies any new onset pain.    No history exists.    Review of Systems  Constitutional: Positive for malaise/fatigue and weight loss.  Respiratory: Positive for cough and shortness of breath.        Chronic cough and shortness of breath.  Currently on O2 at 3 L nasal cannula as baseline.  Gastrointestinal: Positive for nausea and vomiting. Negative for abdominal pain, blood in stool, constipation and diarrhea.  Neurological: Positive for weakness.  Psychiatric/Behavioral:       Patient's son reports the patient has been a little confused within the past few days.  All other systems reviewed and are negative.   Past Medical History:  Diagnosis Date  . Anemia   . CAD (coronary artery disease)    Dr Einar Gip every 6 months  . Cardiomyopathy    EF 25% by echo 2009  . Carotid artery occlusion   . COPD (chronic obstructive pulmonary disease) ([place])    on O2 constantly  . DVT (deep venous thrombosis) ([place])   . Emphysema   . Esophageal cancer ([place])    'causes chest discomfort, liquids and solids"  . History of oxygen administration    Oxygen 3 l/m at rest / 4 l/m when active. 24/7  . Hypertension   . Keeps losing balance   . Myocardial infarction ([place]) 1986  . Peripheral vascular disease ([place])   . Pulmonary nodule    s/p wedge resction, inflammatory, +MAC  . Shortness of breath    . Vitamin B 12 deficiency     Past Surgical History:  Procedure Laterality Date  . ARCH AORTOGRAM N/A 07/21/2011   Procedure: ARCH AORTOGRAM;  Surgeon: Laverda Page, MD;  Location: [facility] CATH LAB;  Service: Cardiovascular;  Laterality: N/A;  . CAROTID ENDARTERECTOMY  2009   triple bypass  . CAROTID-VERTEBRAL BYPASS GRAFT  08/20/2011   Procedure: BYPASS GRAFT CAROTID-VERTEBRAL;  Surgeon: Serafina Mitchell, MD;  Location: [facility] OR;  Service: Vascular;  Laterality: Right;  Right CAROTID-VERTEBRAL TRANSPOSITION  . CORONARY ARTERY BYPASS GRAFT  2009  . ESOPHAGOGASTRODUODENOSCOPY (EGD) WITH PROPOFOL N/A 09/19/2015   Procedure: ESOPHAGOGASTRODUODENOSCOPY (EGD) WITH PROPOFOL;  Surgeon: Clarene Essex, MD;  Location: [facility] ENDOSCOPY;  Service: Endoscopy;  Laterality: N/A;  . IR GENERIC HISTORICAL  11/14/2015   IR CM INJ ANY COLONIC TUBE W/FLUORO 11/14/2015 Darrell K Allred, PA-C [facility]-INTERV RAD  . LUNG SURGERY  2008   tumor"infection"  . PR VEIN BYPASS GRAFT,AORTO-FEM-POP    . SAVORY DILATION N/A 09/19/2015   Procedure: SAVORY DILATION;  Surgeon: Clarene Essex, MD;  Location: [facility] ENDOSCOPY;  Service: Endoscopy;  Laterality: N/A;  needs carm  . VASCULAR SURGERY      has COPD  GOLD III ; Chronic respiratory failure with hypoxia ([place]); PULMONARY NODULE; Stricture of artery ([place]); Occlusion and stenosis of carotid artery without mention of cerebral infarction; Cancer  of lower third of esophagus ([place]); Unintentional weight loss; Dehydration; Severe protein-calorie malnutrition ([place]); Nausea with vomiting; and PEG tube malfunction ([facility]) on his problem list.    is allergic to symbicort [budesonide-formoterol fumarate] and tetanus toxoids.    Medication List       Accurate as of 11/14/15  5:58 PM. Always use your most recent med list.          albuterol 108 (90 Base) MCG/ACT inhaler Commonly known as:  PROAIR HFA Inhale 2 puffs into the lungs every 6 (six) hours as needed. For shortness of breath   albuterol (2.5  MG/3ML) 0.083% nebulizer solution Commonly known as:  PROVENTIL Take 3 mLs (2.5 mg total) by nebulization 4 (four) times daily as needed. For shortness of breath   aspirin 81 MG chewable tablet Chew 81 mg by mouth daily. Reported on 10/11/2015   atorvastatin 40 MG tablet Commonly known as:  LIPITOR Take 40 mg by mouth daily. Reported on 10/11/2015   cholecalciferol 1000 units tablet Commonly known as:  VITAMIN D Take 1,000 Units by mouth daily.   clopidogrel 75 MG tablet Commonly known as:  PLAVIX Take 75 mg by mouth daily. Reported on 10/11/2015   dexamethasone 4 MG tablet Commonly known as:  DECADRON Take 2 tablets (8 mg total) by mouth daily. Start the day after chemotherapy for 2 days.   dronabinol 2.5 MG capsule Commonly known as:  MARINOL Take 1 capsule (2.5 mg total) by mouth 2 (two) times daily before a meal.   feeding supplement (OSMOLITE 1.5 CAL) Liqd Change Osmolite 1.5 to 20 cc an hour continuous feeding via PEG. Increase 10 cc daily until goal rate of 60 cc per hour. Flush feeding tube with 180 cc free water 6 times a day.  Please send pump and supplies to patient's home.   folic acid 1 MG tablet Commonly known as:  FOLVITE Take 1 mg by mouth daily. Reported on 10/11/2015   lidocaine-prilocaine cream Commonly known as:  EMLA Apply to affected area once   losartan 25 MG tablet Commonly known as:  COZAAR Take 25 mg by mouth daily.   metoCLOPramide 5 MG tablet Commonly known as:  REGLAN Take 5 mg by mouth every 6 (six) hours as needed for nausea or vomiting.   metoCLOPramide 10 MG tablet Commonly known as:  REGLAN Take 1 tablet (10 mg total) by mouth 3 (three) times daily before meals.   metoprolol tartrate 25 MG tablet Commonly known as:  LOPRESSOR Take 25 mg by mouth daily.   omeprazole 2 mg/mL Susp Commonly known as:  PRILOSEC Place 20 mLs (40 mg total) into feeding tube daily.   ondansetron 8 MG tablet Commonly known as:  ZOFRAN Take 1 tablet (8  mg total) by mouth 2 (two) times daily as needed for refractory nausea / vomiting. Start on day 3 after chemo.   prochlorperazine 10 MG tablet Commonly known as:  COMPAZINE Take 1 tablet (10 mg total) by mouth every 6 (six) hours as needed (Nausea or vomiting).   senna-docusate 8.6-50 MG tablet Commonly known as:  SENNA S Take 2 tablets by mouth 2 (two) times daily.   SONAFINE Apply 1 application topically 2 (two) times daily.   vitamin B-12 1000 MCG tablet Commonly known as:  CYANOCOBALAMIN Take 1,000 mcg by mouth daily.        PHYSICAL EXAMINATION  Oncology Vitals 11/14/2015 11/14/2015  Height 165 cm -  Weight 63.504 kg -  Weight (lbs) 140 lbs -  BMI (kg/m2) 23.3 kg/m2 -  Temp - 97.5  Pulse - 110  Resp - 20  SpO2 - 93  BSA (m2) 1.71 m2 -   BP Readings from Last 2 Encounters:  11/14/15 134/78  11/14/15 (P) 118/83    Physical Exam  Constitutional: He is oriented to person, place, and time. He appears malnourished and dehydrated. He appears unhealthy. He appears cachectic. He has a sickly appearance.  HENT:  Head: Normocephalic and atraumatic.  Mouth/Throat: Oropharynx is clear and moist.  Eyes: Conjunctivae and EOM are normal. Pupils are equal, round, and reactive to light. Right eye exhibits no discharge. Left eye exhibits no discharge. No scleral icterus.  Neck: Normal range of motion. Neck supple. No JVD present. No tracheal deviation present. No thyromegaly present.  Cardiovascular: Normal rate, regular rhythm, normal heart sounds and intact distal pulses.   Pulmonary/Chest: No stridor. No respiratory distress. He has no wheezes. He has no rales. He exhibits no tenderness.  Breath sounds clear bilaterally on exam; the patient was noted have a very congested cough.  Patient does appear mildly short of breath as his baseline.  He was on 3 L nasal cannula O2.  Abdominal: Soft. Bowel sounds are normal. He exhibits no distension and no mass. There is no tenderness. There  is no rebound and no guarding.  PEG tube intact.  There was some erythema/irritation at the insertion site.  There was no obvious tube feeding leakage from site.  Musculoskeletal: Normal range of motion. He exhibits no edema, tenderness or deformity.  Lymphadenopathy:    He has no cervical adenopathy.  Neurological: He is alert and oriented to person, place, and time.  Patient was observed to week to have weight taken today.  He also needed a 2 person assist to be seated from the wheelchair to the infusion chair.  Skin: Skin is warm and dry. No rash noted. No erythema. There is pallor.  Psychiatric: Affect normal.  Nursing note and vitals reviewed.   LABORATORY DATA:. Admission on 11/14/2015  Component Date Value Ref Range Status  . WBC 11/14/2015 1.5* 4.0 - 10.5 K/uL Final  . RBC 11/14/2015 4.22  4.22 - 5.81 MIL/uL Final  . Hemoglobin 11/14/2015 12.1* 13.0 - 17.0 g/dL Final  . HCT 11/14/2015 35.9* 39.0 - 52.0 % Final  . MCV 11/14/2015 85.1  78.0 - 100.0 fL Final  . MCH 11/14/2015 28.7  26.0 - 34.0 pg Final  . MCHC 11/14/2015 33.7  30.0 - 36.0 g/dL Final  . RDW 11/14/2015 13.0  11.5 - 15.5 % Final  . Platelets 11/14/2015 130* 150 - 400 K/uL Final  . Neutrophils Relative % 11/14/2015 74  % Final  . Neutro Abs 11/14/2015 1.1* 1.7 - 7.7 K/uL Final  . Lymphocytes Relative 11/14/2015 10  % Final  . Lymphs Abs 11/14/2015 0.2* 0.7 - 4.0 K/uL Final  . Monocytes Relative 11/14/2015 15  % Final  . Monocytes Absolute 11/14/2015 0.2  0.1 - 1.0 K/uL Final  . Eosinophils Relative 11/14/2015 0  % Final  . Eosinophils Absolute 11/14/2015 0.0  0.0 - 0.7 K/uL Final  . Basophils Relative 11/14/2015 1  % Final  . Basophils Absolute 11/14/2015 0.0  0.0 - 0.1 K/uL Final  . Sodium 11/14/2015 141  135 - 145 mmol/L Final  . Potassium 11/14/2015 4.8  3.5 - 5.1 mmol/L Final  . Chloride 11/14/2015 106  101 - 111 mmol/L Final  . CO2 11/14/2015 26  22 - 32 mmol/L Final  . Glucose,  Bld 11/14/2015 105* 65 - 99  mg/dL Final  . BUN 11/14/2015 45* 6 - 20 mg/dL Final  . Creatinine, Ser 11/14/2015 1.59* 0.61 - 1.24 mg/dL Final  . Calcium 11/14/2015 8.8* 8.9 - 10.3 mg/dL Final  . Total Protein 11/14/2015 6.4* 6.5 - 8.1 g/dL Final  . Albumin 11/14/2015 3.6  3.5 - 5.0 g/dL Final  . AST 11/14/2015 19  15 - 41 U/L Final  . ALT 11/14/2015 15* 17 - 63 U/L Final  . Alkaline Phosphatase 11/14/2015 43  38 - 126 U/L Final  . Total Bilirubin 11/14/2015 1.0  0.3 - 1.2 mg/dL Final  . GFR calc non Af Amer 11/14/2015 39* >60 mL/min Final  . GFR calc Af Amer 11/14/2015 45* >60 mL/min Final   Comment: (NOTE) The eGFR has been calculated using the CKD EPI equation. This calculation has not been validated in all clinical situations. eGFR's persistently <60 mL/min signify possible Chronic Kidney Disease.   . Anion gap 11/14/2015 9  5 - 15 Final  . Lactic Acid, Venous 11/14/2015 2.1* 0.5 - 1.9 mmol/L Final   Comment: CRITICAL RESULT CALLED TO, READ BACK BY AND VERIFIED WITH: DAVIS,N AT 1754 ON 11/14/2015 BY MOSLEY,J   . Lipase 11/14/2015 30  11 - 51 U/L Final  Appointment on 11/13/2015  Component Date Value Ref Range Status  . WBC 11/13/2015 1.6* 4.0 - 10.3 10e3/uL Final  . NEUT# 11/13/2015 1.4* 1.5 - 6.5 10e3/uL Final  . HGB 11/13/2015 11.5* 13.0 - 17.1 g/dL Final  . HCT 11/13/2015 34.1* 38.4 - 49.9 % Final  . Platelets 11/13/2015 131* 140 - 400 10e3/uL Final  . MCV 11/13/2015 84.8  79.3 - 98.0 fL Final  . MCH 11/13/2015 28.6  27.2 - 33.4 pg Final  . MCHC 11/13/2015 33.7  32.0 - 36.0 g/dL Final  . RBC 11/13/2015 4.02* 4.20 - 5.82 10e6/uL Final  . RDW 11/13/2015 12.8  11.0 - 14.6 % Final  . lymph# 11/13/2015 0.2* 0.9 - 3.3 10e3/uL Final  . MONO# 11/13/2015 0.1  0.1 - 0.9 10e3/uL Final  . Eosinophils Absolute 11/13/2015 0.0  0.0 - 0.5 10e3/uL Final  . Basophils Absolute 11/13/2015 0.0  0.0 - 0.1 10e3/uL Final  . NEUT% 11/13/2015 82.3* 39.0 - 75.0 % Final  . LYMPH% 11/13/2015 9.8* 14.0 - 49.0 % Final  . MONO%  11/13/2015 7.3  0.0 - 14.0 % Final  . EOS% 11/13/2015 0.0  0.0 - 7.0 % Final  . BASO% 11/13/2015 0.6  0.0 - 2.0 % Final  . Retic % 11/13/2015 <0.38* 0.5 - 1.6 % Final  . Retic Ct Abs 11/13/2015 11.66* 34.80 - 93.90 10e3/uL Final  . Immature Retic Fract 11/13/2015 2.00* 3.00 - 10.60 % Final  . Sodium 11/13/2015 141  136 - 145 mEq/L Final  . Potassium 11/13/2015 4.5  3.5 - 5.1 mEq/L Final  . Chloride 11/13/2015 103  98 - 109 mEq/L Final  . CO2 11/13/2015 26  22 - 29 mEq/L Final  . Glucose 11/13/2015 129  70 - 140 mg/dl Final  . BUN 11/13/2015 37.7* 7.0 - 26.0 mg/dL Final  . Creatinine 11/13/2015 1.3  0.7 - 1.3 mg/dL Final  . Total Bilirubin 11/13/2015 0.86  0.20 - 1.20 mg/dL Final  . Alkaline Phosphatase 11/13/2015 50  40 - 150 U/L Final  . AST 11/13/2015 14  5 - 34 U/L Final  . ALT 11/13/2015 12  0 - 55 U/L Final  . Total Protein 11/13/2015 6.3* 6.4 - 8.3 g/dL  Final  . Albumin 11/13/2015 3.1* 3.5 - 5.0 g/dL Final  . Calcium 11/13/2015 9.4  8.4 - 10.4 mg/dL Final  . Anion Gap 11/13/2015 13* 3 - 11 mEq/L Final  . EGFR 11/13/2015 53* >90 ml/min/1.73 m2 Final  . Magnesium 11/13/2015 1.6  1.5 - 2.5 mg/dl Final    RADIOGRAPHIC STUDIES: Ir Cm Inj Any Colonic Tube W/fluoro  Result Date: 11/14/2015 INDICATION: Malnutrition, gastrostomy, leakage at the skin site EXAM: GI TUBE INJECTION MEDICATIONS: None. ANESTHESIA/SEDATION: None. CONTRAST:  10 cc Isovue - administered into the gastric lumen. FLUOROSCOPY TIME:  Fluoroscopy Time:  6 seconds (1 mGy). COMPLICATIONS: None immediate. PROCEDURE: Under fluoroscopy, the existing gastrostomy was injected with contrast. This confirms position in the stomach and tube patency. No leakage or extravasation. No discontinuity. Gastrostomy was secured externally. The dressing was exchange. Mild erythema at the site secondary to skin irritation. IMPRESSION: Patent gastrostomy tube within the stomach. This was re- secured externally to prevent further leakage. Feeding  tube ready for use. Electronically Signed   By: Jerilynn Mages.  Shick M.D.   On: 11/14/2015 15:16   Dg Abd Acute W/chest  Result Date: 11/14/2015 CLINICAL DATA:  Esophageal cancer, weakness, shortness of breath, dehydration, former smoker, coronary artery disease post MI, hypertension, COPD/emphysema EXAM: DG ABDOMEN ACUTE W/ 1V CHEST COMPARISON:  Chest radiograph 05/17/2015 FINDINGS: RIGHT jugular Port-A-Cath with tip projecting over SVC. Minimal enlargement of cardiac silhouette post CABG. Atherosclerotic calcification aorta. Mediastinal contours normal. Emphysematous changes consistent with COPD. Postsurgical changes in the LEFT perihilar region. Bibasilar atelectasis. No definite infiltrate, pleural effusion or pneumothorax. Nonobstructive bowel gas pattern. No bowel dilatation or bowel wall thickening, or free air. Scattered radiodense tearing small bowel and colon. Probable bladder dysphagia. Diffuse osseous demineralization. IMPRESSION: Post CABG. COPD changes with bibasilar atelectasis. No acute abdominal findings. Aortic atherosclerosis. Electronically Signed   By: Lavonia Dana M.D.   On: 11/14/2015 17:10    ASSESSMENT/PLAN:    PEG tube malfunction [facility]) Patient states that his PEG tube has been leaking tube feedings around the insertion site for the past several days.  He is also been experiencing frequent nausea and vomiting as well.  He states that he has had very little oral intake recently.  He feels dehydrated and weak today.  Patient's PEG tube was evaluated by interventional radiology today; and it was confirmed that the PEG tube was intact, and in the appropriate position.  This provider spoke directly to Rowe Robert, PA  in interventional radiology; and he stated that the PEG tube was "cinched up" to the PEG tube insertion site to help prevent any further leaks.  It was noted that time that there was some irritation/erythema around the insertion site; and Interventional Radiology recommended to  use only one gauze pad/sponge under the PEG tube disc to prevent any further irritation.  Nausea with vomiting Patient states that his PEG tube has been leaking tube feedings around the insertion site for the past several days.  He is also been experiencing frequent nausea and vomiting as well.  Patient states he has been experiencing bouts of vomiting when he tries to taking any fluids whatsoever. He states that he has had very little oral intake recently.  He states that his bowel movements have remained regular.  He feels dehydrated and weak today.  Patient's PEG tube was evaluated by interventional radiology today; and it was confirmed that the PEG tube was intact, and in the appropriate position.  This provider spoke directly to Rowe Robert, PA  in interventional radiology; and he stated that the PEG tube was "cinched up" to the PEG tube insertion site to help prevent any further leaks.  It was noted that time that there was some irritation/erythema around the insertion site; and Interventional Radiology recommended to use only one gauze pad/sponge under the PEG tube disc to prevent any further irritation.  Patient received IV fluid rehydration while at the [facility] today; but was still unable to take in any fluids by mouth while at the cancer center.  Reviewed all findings with Dr. Julien Nordmann; and decision was made to transport patient to the emergency department for further evaluation and management of patient's recent onset nausea/vomiting and subsequent dehydration.  Differential diagnosis would include possible bowel obstruction.  Brief history.  Report were called to the emergency department charge nurse; prior to the patient being transported to the emergency department via wheelchair.  Per the cancer Center nurse.  CODE STATUS: Patient should be considered a full code; since there are no advanced directives in the patient's chart.  Dehydration Patient states that his PEG tube has been  leaking tube feedings around the insertion site for the past several days.  He is also been experiencing frequent nausea and vomiting as well.  Patient states he has been experiencing bouts of vomiting when he tries to taking any fluids whatsoever. He states that he has had very little oral intake recently.  He states that his bowel movements have remained regular.  He feels dehydrated and weak today.  Patient's PEG tube was evaluated by interventional radiology today; and it was confirmed that the PEG tube was intact, and in the appropriate position.  This provider spoke directly to Rowe Robert, PA  in interventional radiology; and he stated that the PEG tube was "cinched up" to the PEG tube insertion site to help prevent any further leaks.  It was noted that time that there was some irritation/erythema around the insertion site; and Interventional Radiology recommended to use only one gauze pad/sponge under the PEG tube disc to prevent any further irritation.  Patient received IV fluid rehydration while at the [place] [facility] today; but was still unable to take in any fluids by mouth while at the cancer center.  Reviewed all findings with Dr. Julien Nordmann; and decision was made to transport patient to the emergency department for further evaluation and management of patient's recent onset nausea/vomiting and subsequent dehydration.  Differential diagnosis would include possible bowel obstruction.  Brief history.  Report were called to the emergency department charge nurse; prior to the patient being transported to the emergency department via wheelchair.  Per the cancer Center nurse.  CODE STATUS: Patient should be considered a full code; since there are no advanced directives in the patient's chart.  Cancer of lower third of esophagus [facility]) Patient is currently undergoing carboplatin/Taxol chemotherapy regimen.  Patient received cycle 4 of his chemotherapy on 11/06/2015; and was scheduled to receive cycle  5 of the same chemotherapy regimen yesterday-but his chemotherapy was held due to mild neutropenia and patient's weakened state.  Patient also continues to receive radiation treatments on a daily basis.  Patient presented to the [place] [facility] today with complaint of continued nausea/vomiting, or poor oral intake, feeling dehydrated, and increased weakened state.  Patient's son states that it took 3 people to assist his father into the house yesterday afternoon.  Patient's son reports the patient is unable to ambulate whatsoever now; and is having difficulty walking in general.  Patient continues with chronic  shortness of breath; and uses 3 L O2 nasal cannula when at rest and will increase to 4 L nasal cannula when he is exerting himself.  Patient's blood pressure was low today at 96/42.  Confirmed that patient has not taken either of his 2 blood pressure medications at all today.  Due to all of patient's issues today-patient will be transported to the emergency department for further evaluation and management.  See further notes for details.  Patient is scheduled to return on 11/20/2015 for visits, lap, and chemotherapy.  * Brief report given to on call physician Dr. Marin Olp regarding ED transport.   Patient stated understanding of all instructions; and was in agreement with this plan of care. The patient knows to call the clinic with any problems, questions or concerns.   Total time spent with patient was 40 minutes;  with greater than 75 percent of that time spent in face to face counseling regarding patient's symptoms,  and coordination of care and follow up.  Disclaimer:This dictation was prepared with Dragon/digital dictation along with [employer]. Any transcriptional errors that result from this process are unintentional.  Drue Second, NP 11/14/2015

## 2015-11-14 NOTE — Assessment & Plan Note (Signed)
Patient is currently undergoing carboplatin/Taxol chemotherapy regimen.  Patient received cycle 4 of his chemotherapy on 11/06/2015; and was scheduled to receive cycle 5 of the same chemotherapy regimen yesterday-but his chemotherapy was held due to mild neutropenia and patient's weakened state.  Patient also continues to receive radiation treatments on a daily basis.  Patient presented to the [facility] today with complaint of continued nausea/vomiting, or poor oral intake, feeling dehydrated, and increased weakened state.  Patient's son states that it took 3 people to assist his father into the house yesterday afternoon.  Patient's son reports the patient is unable to ambulate whatsoever now; and is having difficulty walking in general.  Patient continues with chronic shortness of breath; and uses 3 L O2 nasal cannula when at rest and will increase to 4 L nasal cannula when he is exerting himself.  Patient's blood pressure was low today at 96/42.  Confirmed that patient has not taken either of his 2 blood pressure medications at all today.  Due to all of patient's issues today-patient will be transported to the emergency department for further evaluation and management.  See further notes for details.  Patient is scheduled to return on 11/20/2015 for visits, lap, and chemotherapy.

## 2015-11-14 NOTE — Assessment & Plan Note (Signed)
Patient states that his PEG tube has been leaking tube feedings around the insertion site for the past several days.  He is also been experiencing frequent nausea and vomiting as well.  He states that he has had very little oral intake recently.  He feels dehydrated and weak today.  Patient's PEG tube was evaluated by interventional radiology today; and it was confirmed that the PEG tube was intact, and in the appropriate position.  This provider spoke directly to Rowe Robert, PA  in interventional radiology; and he stated that the PEG tube was "cinched up" to the PEG tube insertion site to help prevent any further leaks.  It was noted that time that there was some irritation/erythema around the insertion site; and Interventional Radiology recommended to use only one gauze pad/sponge under the PEG tube disc to prevent any further irritation.

## 2015-11-14 NOTE — Assessment & Plan Note (Signed)
Patient states that his PEG tube has been leaking tube feedings around the insertion site for the past several days.  He is also been experiencing frequent nausea and vomiting as well.  Patient states he has been experiencing bouts of vomiting when he tries to taking any fluids whatsoever. He states that he has had very little oral intake recently.  He states that his bowel movements have remained regular.  He feels dehydrated and weak today.  Patient's PEG tube was evaluated by interventional radiology today; and it was confirmed that the PEG tube was intact, and in the appropriate position.  This provider spoke directly to Rowe Robert, PA  in interventional radiology; and he stated that the PEG tube was "cinched up" to the PEG tube insertion site to help prevent any further leaks.  It was noted that time that there was some irritation/erythema around the insertion site; and Interventional Radiology recommended to use only one gauze pad/sponge under the PEG tube disc to prevent any further irritation.  Patient received IV fluid rehydration while at the [facility] today; but was still unable to take in any fluids by mouth while at the cancer center.  Reviewed all findings with Dr. Julien Nordmann; and decision was made to transport patient to the emergency department for further evaluation and management of patient's recent onset nausea/vomiting and subsequent dehydration.  Differential diagnosis would include possible bowel obstruction.  Brief history.  Report were called to the emergency department charge nurse; prior to the patient being transported to the emergency department via wheelchair.  Per the cancer Center nurse.  CODE STATUS: Patient should be considered a full code; since there are no advanced directives in the patient's chart.

## 2015-11-15 ENCOUNTER — Telehealth: Payer: Self-pay | Admitting: Hematology

## 2015-11-15 ENCOUNTER — Ambulatory Visit: Payer: Medicare Other

## 2015-11-15 ENCOUNTER — Ambulatory Visit
Admission: RE | Admit: 2015-11-15 | Discharge: 2015-11-15 | Disposition: A | Discharge status: Home / Self Care | Payer: Medicare Other | Source: Ambulatory Visit | Attending: Radiation Oncology | Admitting: Radiation Oncology

## 2015-11-15 ENCOUNTER — Encounter: Payer: Self-pay | Admitting: *Deleted

## 2015-11-15 DIAGNOSIS — R627 Adult failure to thrive: Secondary | ICD-10-CM | POA: Diagnosis not present

## 2015-11-15 DIAGNOSIS — D61818 Other pancytopenia: Secondary | ICD-10-CM

## 2015-11-15 DIAGNOSIS — R5383 Other fatigue: Secondary | ICD-10-CM | POA: Diagnosis not present

## 2015-11-15 DIAGNOSIS — C159 Malignant neoplasm of esophagus, unspecified: Secondary | ICD-10-CM | POA: Diagnosis not present

## 2015-11-15 DIAGNOSIS — C155 Malignant neoplasm of lower third of esophagus: Secondary | ICD-10-CM

## 2015-11-15 DIAGNOSIS — K219 Gastro-esophageal reflux disease without esophagitis: Secondary | ICD-10-CM | POA: Diagnosis not present

## 2015-11-15 DIAGNOSIS — E860 Dehydration: Secondary | ICD-10-CM | POA: Diagnosis not present

## 2015-11-15 DIAGNOSIS — D709 Neutropenia, unspecified: Secondary | ICD-10-CM

## 2015-11-15 LAB — DIFFERENTIAL
BASOS ABS: 0 10*3/uL (ref 0.0–0.1)
BASOS PCT: 0 %
EOS PCT: 0 %
Eosinophils Absolute: 0 10*3/uL (ref 0.0–0.7)
LYMPHS ABS: 0.1 10*3/uL — AB (ref 0.7–4.0)
Lymphocytes Relative: 13 %
MONO ABS: 0.2 10*3/uL (ref 0.1–1.0)
MONOS PCT: 20 %
NEUTROS ABS: 0.7 10*3/uL — AB (ref 1.7–7.7)
Neutrophils Relative %: 67 %

## 2015-11-15 LAB — GLUCOSE, CAPILLARY
GLUCOSE-CAPILLARY: 88 mg/dL (ref 65–99)
GLUCOSE-CAPILLARY: 122 mg/dL — AB (ref 65–99)
Glucose-Capillary: 73 mg/dL (ref 65–99)
Glucose-Capillary: 110 mg/dL — ABNORMAL HIGH (ref 65–99)

## 2015-11-15 LAB — BASIC METABOLIC PANEL
Anion gap: 5 (ref 5–15)
BUN: 38 mg/dL — AB (ref 6–20)
CHLORIDE: 113 mmol/L — AB (ref 101–111)
CO2: 25 mmol/L (ref 22–32)
CREATININE: 1.22 mg/dL (ref 0.61–1.24)
Calcium: 7.9 mg/dL — ABNORMAL LOW (ref 8.9–10.3)
GFR calc Af Amer: >60 mL/min (ref >60)
GFR calc non Af Amer: 53 mL/min — ABNORMAL LOW (ref >60)
GLUCOSE: 80 mg/dL (ref 65–99)
Potassium: 3.8 mmol/L (ref 3.5–5.1)
SODIUM: 143 mmol/L (ref 135–145)

## 2015-11-15 LAB — CBC
HCT: 26.2 % — ABNORMAL LOW (ref 39.0–52.0)
HEMOGLOBIN: 8.8 g/dL — AB (ref 13.0–17.0)
MCH: 28.1 pg (ref 26.0–34.0)
MCHC: 33.6 g/dL (ref 30.0–36.0)
MCV: 83.7 fL (ref 78.0–100.0)
Platelets: 110 10*3/uL — ABNORMAL LOW (ref 150–400)
RBC: 3.13 MIL/uL — ABNORMAL LOW (ref 4.22–5.81)
RDW: 12.9 % (ref 11.5–15.5)
WBC: 1 10*3/uL — CL (ref 4.0–10.5)

## 2015-11-15 LAB — LACTIC ACID, PLASMA: Lactic Acid, Venous: 0.9 mmol/L (ref 0.5–1.9)

## 2015-11-15 LAB — BRAIN NATRIURETIC PEPTIDE: B NATRIURETIC PEPTIDE 5: 174.2 pg/mL — AB (ref 0.0–100.0)

## 2015-11-15 MED ORDER — SODIUM CHLORIDE 0.9 % IV SOLN
INTRAVENOUS | Status: DC
  Administered 2015-11-15: 14:00:00 via INTRAVENOUS

## 2015-11-15 MED ORDER — FREE WATER
180.0000 mL | Status: DC
  Administered 2015-11-15 – 2015-11-18 (×20): 180 mL
  Administered 2015-11-19: 30 mL

## 2015-11-15 MED ORDER — OSMOLITE 1.5 CAL PO LIQD
1000.0000 mL | ORAL | Status: DC
  Administered 2015-11-15 – 2015-11-16 (×2): 1000 mL
  Filled 2015-11-15 (×4): qty 1000

## 2015-11-15 NOTE — Progress Notes (Signed)
Follow up check on G-tube. Pt admitted last pm for dehydration and fatigue. Was here at [facility] IR yesterday for G-tube check due to leakage. G-tube originally placed 10/15/15 Tube was injected and confirmed proper position. Tube/bumper re-secured to stop leakage.  Pt feels pretty good, said tube was used last night, no leak.  Tube intact, site clean, dressing clean. No apparent leakage.  D/w attending MD that G-tube is okay to use.  Ascencion Dike PA-C Interventional Radiology 11/15/2015 9:34 AM

## 2015-11-15 NOTE — Progress Notes (Signed)
[place] [facility] Therapy Treatment Record Phone [phone]   Radiation Therapy was administered to Mondamin on: 11/15/2015  12:24 PM and was treatment #26 out of a planned course of 30 treatments.  Radiation Treatment  1). Beam photons with 6-10 energy and Electrons 6-10 MeV  2). Brachytherapy None  3). Stereotactic Radiosurgery None  4). Other Radiation None     Camarion Weier J, RT (T)

## 2015-11-15 NOTE — Telephone Encounter (Signed)
Appointment confirmed with patient. 11/15/15

## 2015-11-15 NOTE — Progress Notes (Signed)
HEMATOLOGY-ONCOLOGY PROGRESS NOTE  SUBJECTIVE: Stage III esophageal cancer receiving concurrent chemoradiation last chemotherapy 11/06/2015 with Taxol and carboplatin admitted to the hospital with failure to thrive, generalized weakness with inability to walk, pancytopenia  OBJECTIVE: REVIEW OF SYSTEMS:   Constitutional: Denies fevers, chills, profound generalized weakness Eyes: Denies blurriness of vision Ears, nose, mouth, throat, and face: Dry mouth Respiratory: Denies cough, dyspnea or wheezes Cardiovascular: Denies palpitation, chest discomfort Gastrointestinal:  PEG tube with leaking around it intermittently Skin: Bruises on the skin Lymphatics: Denies new lymphadenopathy or easy bruising Neurological: Generalized weakness Behavioral/Psych: Mood is stable, no new changes  Extremities: No lower extremity edema All other systems were reviewed with the patient and are negative.  I have reviewed the past medical history, past surgical history, social history and family history with the patient and they are unchanged from previous note.   PHYSICAL EXAMINATION: ECOG PERFORMANCE STATUS: 3 - Symptomatic, >50% confined to bed  Vitals:   11/15/15 0543 11/15/15 0959  BP: (!) 100/54 130/71  Pulse: 98   Resp: 18   Temp: 98.2 F (36.8 C)    Filed Weights   11/14/15 1624 11/14/15 2024  Weight: 140 lb (63.5 kg) 140 lb (63.5 kg)    GENERAL:alert, no distress and comfortable, Profound generalized weakness SKIN: skin color, texture, turgor are normal, no rashes or significant lesions EYES: normal, Conjunctiva are pink and non-injected, sclera clear OROPHARYNX:no exudate, no erythema and lips, buccal mucosa, and tongue normal  NECK: supple, thyroid normal size, non-tender, without nodularity LUNGS: clear to auscultation and percussion with normal breathing effort HEART: regular rate & rhythm and no murmurs and no lower extremity edema ABDOMEN:PEG tube Musculoskeletal:no cyanosis of  digits and no clubbing  NEURO: alert & oriented x 3 with fluent speech, no focal motor/sensory deficits  LABORATORY DATA:  I have reviewed the data as listed CMP Latest Ref Rng & Units 11/15/2015 11/14/2015 11/13/2015  Glucose 65 - 99 mg/dL 80 105(H) 129  BUN 6 - 20 mg/dL 38(H) 45(H) 37.7(H)  Creatinine 0.61 - 1.24 mg/dL 1.22 1.59(H) 1.3  Sodium 135 - 145 mmol/L 143 141 141  Potassium 3.5 - 5.1 mmol/L 3.8 4.8 4.5  Chloride 101 - 111 mmol/L 113(H) 106 -  CO2 22 - 32 mmol/L 25 26 26   Calcium 8.9 - 10.3 mg/dL 7.9(L) 8.8(L) 9.4  Total Protein 6.5 - 8.1 g/dL - 6.4(L) 6.3(L)  Total Bilirubin 0.3 - 1.2 mg/dL - 1.0 0.86  Alkaline Phos 38 - 126 U/L - 43 50  AST 15 - 41 U/L - 19 14  ALT 17 - 63 U/L - 15(L) 12    Lab Results  Component Value Date   WBC 1.0 (LL) 11/15/2015   HGB 8.8 (L) 11/15/2015   HCT 26.2 (L) 11/15/2015   MCV 83.7 11/15/2015   PLT 110 (L) 11/15/2015   NEUTROABS PENDING 11/15/2015    ASSESSMENT AND PLAN: 1. 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 Currently receiving radiation alone. He did not receive radiation yesterday. Dr. Lisbeth Renshaw is seeing the patient today to make a determination about continuation of radiation therapy.  2. severe neutropenia: ANC is not available from today. We do not like to administer growth factor injections while patients receive radiation. It is for this reason I'm not recommending Neupogen injections unless the patient has neutropenic fever.  3. Anemia and thrombocytopenia can be observed 4. Generalized fatigue  and weakness: Will need physical therapy  No further specific recommendations regarding his generalized fatigue or weakness. For the neutropenia, please keep a close watch on temperatures. Growth factors only if he has fever.  Please call if there are any further questions.

## 2015-11-15 NOTE — Progress Notes (Signed)
OT Cancellation Note  Patient Details Name: [name] MRN: [id number] DOB: 04-11-1931   Cancelled Treatment:    Reason Eval/Treat Not Completed: Fatigue/lethargy limiting ability to participate.  Pt s/p XRT today. Will check back over the weekend, if able  [facility] 11/15/2015, 2:52 PM  Lesle Chris, OTR/L [id number] 11/15/2015

## 2015-11-15 NOTE — Care Management Obs Status (Signed)
[place] [facility] NOTIFICATION   Patient Details  Name: [name] MRN: [id number] Date of Birth: 1932/03/18   Medicare Observation Status Notification Given:  Yes    Lynnell Catalan, RN 11/15/2015, 2:25 PM

## 2015-11-15 NOTE — Progress Notes (Signed)
PROGRESS NOTE    [name]  [id number] DOB: 05-Dec-1931 DOA: 11/14/2015 PCP: [name], MD   Brief Narrative: [name] is a 82 y.o. gentleman with a history of esophageal cancer (diagnosed earlier this year, not a surgical candidate, active chemotherapy and radiation treatments), CAD S/P CABG, combined systolic and diastolic heart failure (EF 35-40% and grade I diastolic dysfunction on last echo), PVD, and prior DVT who was referred to the ED from oncology clinic for evaluation of weakness and adult failure to thrive.  He is S/P four cycles of carboplatin and taxol; fifth cycle has been deferred due to his deconditioned stated.  He denies fever.  He has had a chronic cough productive of clear sputum/white sputum which is unchanged.  No hemoptysis.  He denies chest pain, pressure, or tightness.  He reports intermittent heart burn.  He has chronic shortness of breath which he relates to a prior lung surgery for a benign lung mass.  He wears 3L [place] at baseline which is chronic and unchanged.  He has had intermittent light-headedness but no syncope or LOC.  No abdominal pain.  Stools have been loose since starting tube feedings.  No dysuria.  He denies blood in his urine or stool.  Of note, he has a gastrostomy tube for primary nutrition (he takes very little by mouth anymore), and he has had recurrent problems with leaking and tube malfunction.  He also reports that he has had difficulty tolerating the recommended amount of daily tube feedings to meet his nutritional needs (should be on Osmolite 1.5, 6 cans daily with 120cc of free water with each can).  He has not been getting the recommended amount of free water either.   Assessment & Plan:   Active Problems:   Chronic respiratory failure with hypoxia (HCC)   Dehydration   Severe protein-calorie malnutrition (HCC)   PEG tube malfunction (HCC)   Adult failure to thrive   Antineoplastic chemotherapy induced pancytopenia (HCC)  Hypomagnesemia   CHF (congestive heart failure) (HCC)   CAD (coronary artery disease)   Acid reflux   Esophageal cancer patient, actively undergoing chemotherapy and radiation treatments, who presents with pancytopenia and dehydration.  Admits that intake through his PEG has been inadequate due to recurrent problems with the functioning of the tube. --Somewhat improved after IV fluids.  Will continue hydration cautiously overnight due to history of CHF. So far, he appears compensated. --UA pending  --oncology , radiation oncology contacted.  --PT eval and treat --Nutrition consult placed.  --Pepcid BID for reflux symptoms  Nutrition; G tube.  Leaking G tube.  Will consult IR.   Hypomagnesemia (borderline) --IV replacement ordered x 1 --Repeat Mg in am   CAD --Continue aspirin, plavix, BB, statin for now.   --Agree with holding ARB due to renal insufficiency.  Combined CHF, compensated at this point --BNP at 175.  --Monitor on IV fluids.   DVT prophylaxis: SCD Code Status: Full code.  Family Communication: care disc Disposition Plan: to be determine. PT   Consultants:   Dr Lindi Adie , oncology   Dr Lisbeth Renshaw    Procedures:   none   Antimicrobials:   none   Subjective: Very pleasant, relates feeling better than yesterday   Objective: Vitals:   11/14/15 1951 11/14/15 2024 11/14/15 2303 11/15/15 0543  BP: 129/75 123/74 115/71 (!) 100/54  Pulse: 105 (!) 110 (!) 106 98  Resp: 17 18 18 18   Temp: 97.6 F (36.4 C) 97.9 F (36.6 C) 97.9 F (36.6 C)  98.2 F (36.8 C)  TempSrc: Oral Oral Oral Oral  SpO2: 96% 100% 98% 100%  Weight:  63.5 kg (140 lb)    Height:  5\' 5"  (1.651 m)      Intake/Output Summary (Last 24 hours) at 11/15/15 0857 Last data filed at 11/14/15 1952  Gross per 24 hour  Intake             1000 ml  Output                0 ml  Net             1000 ml   Filed Weights   11/14/15 1624 11/14/15 2024  Weight: 63.5 kg (140 lb) 63.5 kg (140 lb)     Examination:  General exam: Appears calm and comfortable  Respiratory system: Clear to auscultation. Respiratory effort normal. Cardiovascular system: S1 & S2 heard, RRR. No JVD, murmurs, rubs, gallops or clicks. No pedal edema. Gastrointestinal system: Abdomen is nondistended, soft and nontender. No organomegaly or masses felt. Normal bowel sounds heard. Central nervous system: Alert and oriented. No focal neurological deficits. Extremities: Symmetric 5 x 5 power. Skin: No rashes, lesions or ulcers Psychiatry: Judgement and insight appear normal. Mood & affect appropriate.     Data Reviewed: I have personally reviewed following labs and imaging studies  CBC:  Recent Labs Lab 11/13/15 1005 11/14/15 1658 11/15/15 0537  WBC 1.6* 1.5* 1.0*  NEUTROABS 1.4* 1.1*  --   HGB 11.5* 12.1* 8.8*  HCT 34.1* 35.9* 26.2*  MCV 84.8 85.1 83.7  PLT 131* 130* [id number]*   Basic Metabolic Panel:  Recent Labs Lab 11/13/15 1005 11/14/15 1658 11/15/15 0537  NA 141 141 143  K 4.5 4.8 3.8  CL  --  106 113*  CO2 26 26 25   GLUCOSE 129 105* 80  BUN 37.7* 45* 38*  CREATININE 1.3 1.59* 1.22  CALCIUM 9.4 8.8* 7.9*  MG 1.6  --   --    GFR: Estimated Creatinine Clearance: 40.6 mL/min (by C-G formula based on SCr of 1.22 mg/dL). Liver Function Tests:  Recent Labs Lab 11/13/15 1005 11/14/15 1658  AST 14 19  ALT 12 15*  ALKPHOS 50 43  BILITOT 0.86 1.0  PROT 6.3* 6.4*  ALBUMIN 3.1* 3.6    Recent Labs Lab 11/14/15 1703  LIPASE 30   No results for input(s): AMMONIA in the last 168 hours. Coagulation Profile: No results for input(s): INR, PROTIME in the last 168 hours. Cardiac Enzymes: No results for input(s): CKTOTAL, CKMB, CKMBINDEX, TROPONINI in the last 168 hours. BNP (last 3 results) No results for input(s): PROBNP in the last 8760 hours. HbA1C: No results for input(s): HGBA1C in the last 72 hours. CBG: No results for input(s): GLUCAP in the last 168 hours. Lipid  Profile: No results for input(s): CHOL, HDL, LDLCALC, TRIG, CHOLHDL, LDLDIRECT in the last 72 hours. Thyroid Function Tests: No results for input(s): TSH, T4TOTAL, FREET4, T3FREE, THYROIDAB in the last 72 hours. Anemia Panel:  Recent Labs  11/13/15 1005  RETICCTPCT <0.38*   Sepsis Labs:  Recent Labs Lab 11/14/15 1703 11/15/15 0106  LATICACIDVEN 2.1* 0.9    No results found for this or any previous visit (from the past 240 hour(s)).       Radiology Studies: Ir Cm Inj Any Colonic Tube W/fluoro  Result Date: 11/14/2015 INDICATION: Malnutrition, gastrostomy, leakage at the skin site EXAM: GI TUBE INJECTION MEDICATIONS: None. ANESTHESIA/SEDATION: None. CONTRAST:  10 cc Isovue - administered into  the gastric lumen. FLUOROSCOPY TIME:  Fluoroscopy Time:  6 seconds (1 mGy). COMPLICATIONS: None immediate. PROCEDURE: Under fluoroscopy, the existing gastrostomy was injected with contrast. This confirms position in the stomach and tube patency. No leakage or extravasation. No discontinuity. Gastrostomy was secured externally. The dressing was exchange. Mild erythema at the site secondary to skin irritation. IMPRESSION: Patent gastrostomy tube within the stomach. This was re- secured externally to prevent further leakage. Feeding tube ready for use. Electronically Signed   By: Jerilynn Mages.  Shick M.D.   On: 11/14/2015 15:16   Dg Abd Acute W/chest  Result Date: 11/14/2015 CLINICAL DATA:  Esophageal cancer, weakness, shortness of breath, dehydration, former smoker, coronary artery disease post MI, hypertension, COPD/emphysema EXAM: DG ABDOMEN ACUTE W/ 1V CHEST COMPARISON:  Chest radiograph 05/17/2015 FINDINGS: RIGHT jugular Port-A-Cath with tip projecting over SVC. Minimal enlargement of cardiac silhouette post CABG. Atherosclerotic calcification aorta. Mediastinal contours normal. Emphysematous changes consistent with COPD. Postsurgical changes in the LEFT perihilar region. Bibasilar atelectasis. No definite  infiltrate, pleural effusion or pneumothorax. Nonobstructive bowel gas pattern. No bowel dilatation or bowel wall thickening, or free air. Scattered radiodense tearing small bowel and colon. Probable bladder dysphagia. Diffuse osseous demineralization. IMPRESSION: Post CABG. COPD changes with bibasilar atelectasis. No acute abdominal findings. Aortic atherosclerosis. Electronically Signed   By: Lavonia Dana M.D.   On: 11/14/2015 17:10        Scheduled Meds: . aspirin  81 mg Oral Daily  . atorvastatin  40 mg Oral Daily  . clopidogrel  75 mg Oral Daily  . dronabinol  2.5 mg Oral BID AC  . feeding supplement (OSMOLITE 1.5 CAL)  237 mL Per Tube QID  . folic acid  1 mg Oral Daily  . metoCLOPramide  10 mg Oral TID AC  . metoprolol tartrate  25 mg Oral Daily  . ranitidine  150 mg Per Tube BID   Continuous Infusions:    LOS: 0 days    Time spent: 35 minutes.     Elmarie Shiley, MD [facility] Pager [phone]  If 7PM-7AM, please contact night-coverage [email or web address] Password [facility] 11/15/2015, 8:57 AM

## 2015-11-15 NOTE — Progress Notes (Addendum)
Weekly rad txs  Md saw Patient,  No vitals taken, patient was having diarrhea, had to remove  His sheets, cleaned patient up and  repklaced sheets and warm blankets for the patient, called the floor spoke patient's RN , informed her had had about 150cc dark brown liquid stool 1:02 PM  12:59 PM

## 2015-11-15 NOTE — Progress Notes (Signed)
PT Cancellation Note  Patient Details Name: [name] MRN: [id number] DOB: February 07, 1932   Cancelled Treatment:    Reason Eval/Treat Not Completed: Patient at procedure or test/unavailable. Will follow.    [name] 11/15/2015, 11:58 AM [phone]

## 2015-11-15 NOTE — Progress Notes (Signed)
Department of Radiation Oncology  Phone:  [phone] Fax:        [phone]   INPATIENT  Weekly Treatment Note    Name: [name] Date: 11/15/2015 MRN: [id number] DOB: [birth year]   Diagnosis:     ICD-9-CM ICD-10-CM   1. Carcinoma of lower third of esophagus (HCC) 150.5 C15.5      Current dose: 54 Gy  Current fraction: 27   MEDICATIONS: No current facility-administered medications for this encounter.    No current outpatient prescriptions on file.   Facility-Administered Medications Ordered in Other Encounters  Medication Dose Route Frequency Provider Last Rate Last Dose  . 0.9 %  sodium chloride infusion   Intravenous Continuous Belkys A Regalado, MD      . acetaminophen (TYLENOL) tablet 650 mg  650 mg Oral Q6H PRN Lily Kocher, MD       Or  . acetaminophen (TYLENOL) suppository 650 mg  650 mg Rectal Q6H PRN Lily Kocher, MD      . albuterol (PROVENTIL) (2.5 MG/3ML) 0.083% nebulizer solution 2.5 mg  2.5 mg Nebulization QID PRN Lily Kocher, MD      . aspirin chewable tablet 81 mg  81 mg Oral Daily Lily Kocher, MD   81 mg at 11/15/15 0950  . atorvastatin (LIPITOR) tablet 40 mg  40 mg Oral Daily Lily Kocher, MD   40 mg at 11/15/15 0950  . clopidogrel (PLAVIX) tablet 75 mg  75 mg Oral Daily Lily Kocher, MD   75 mg at 11/14/15 2350  . dronabinol (MARINOL) capsule 2.5 mg  2.5 mg Oral BID AC Lily Kocher, MD   2.5 mg at 11/15/15 0859  . feeding supplement (OSMOLITE 1.5 CAL) liquid 1,000 mL  1,000 mL Per Tube Continuous Belkys A Regalado, MD      . folic acid (FOLVITE) tablet 1 mg  1 mg Oral Daily Lily Kocher, MD   1 mg at 11/15/15 0953  . free water 180 mL  180 mL Per Tube Q4H Belkys A Regalado, MD      . metoCLOPramide (REGLAN) tablet 10 mg  10 mg Oral TID AC Lily Kocher, MD   10 mg at 11/15/15 0859  . metoprolol tartrate (LOPRESSOR) tablet 25 mg  25 mg Oral Daily Lily Kocher, MD   25 mg at 11/15/15 0959  . ondansetron (ZOFRAN) tablet 4 mg  4 mg Oral Q6H PRN  Lily Kocher, MD       Or  . ondansetron [facility]) injection 4 mg  4 mg Intravenous Q6H PRN Lily Kocher, MD      . ranitidine (ZANTAC) 150 MG/10ML syrup 150 mg  150 mg Per Tube BID Lily Kocher, MD   150 mg at 11/15/15 [id number]     ALLERGIES: Symbicort [budesonide-formoterol fumarate] and Tetanus toxoids   LABORATORY DATA:  Lab Results  Component Value Date   WBC 1.0 (LL) 11/15/2015   HGB 8.8 (L) 11/15/2015   HCT 26.2 (L) 11/15/2015   MCV 83.7 11/15/2015   PLT 110 (L) 11/15/2015   Lab Results  Component Value Date   NA 143 11/15/2015   K 3.8 11/15/2015   CL 113 (H) 11/15/2015   CO2 25 11/15/2015   Lab Results  Component Value Date   ALT 15 (L) 11/14/2015   AST 19 11/14/2015   ALKPHOS 43 11/14/2015   BILITOT 1.0 11/14/2015     NARRATIVE: [name] was seen today for weekly treatment management. The chart was checked and the patient's films were reviewed.  Weekly rad txs  Md saw Patient,  No vitals taken, patient was having diarrhea, had to remove  His sheets, cleaned patient up and  repklaced sheets and warm blankets for the patient, called the floor spoke patient's RN , informed her had had about 150cc dark brown liquid stool 1:49 PM    PHYSICAL EXAMINATION: vitals were not taken for this visit.     Alert, no acute distress, lying in his hospital bed.  ASSESSMENT: The patient is doing satisfactorily with treatment.  PLAN: We will continue with the patient's radiation treatment as planned. The patient has 3 more fractions and I stressed to him that I would like to finish these treatments next week.

## 2015-11-15 NOTE — Progress Notes (Signed)
Initial Nutrition Assessment  DOCUMENTATION CODES:   Severe malnutrition in context of chronic illness  INTERVENTION:  -Recommend monitor Mg, Phos, K for at least 3 days, MD replete as necessary, pt is at high risk of refeeding. -Follow outpt recommendations of Osmolite 1.5 @ 57mL/hr increase by 10 q24h to goal rate of 66mL per hour -137mL free water q4h -At goal, provides 2160 calories, 90gm pro, and  2177cc free water (meets 100% of all estimated needs)  NUTRITION DIAGNOSIS:   Malnutrition related to chronic illness as evidenced by energy intake < 75% for > or equal to 1 month, severe depletion of muscle mass, severe depletion of body fat, percent weight loss.  GOAL:   Patient will meet greater than or equal to 90% of their needs  MONITOR:   PO intake, Labs, TF tolerance, I & O's, Skin, Weight trends  REASON FOR ASSESSMENT:   Consult, Malnutrition Screening Tool Enteral/tube feeding initiation and management  ASSESSMENT:   [name] is a 82 y.o. gentleman with a history of esophageal cancer (diagnosed earlier this year, not a surgical candidate, active chemotherapy and radiation treatments), CAD S/P CABG, combined systolic and diastolic heart failure (EF 35-40% and grade I diastolic dysfunction on last echo), PVD, and prior DVT who was referred to the ED from oncology clinic for evaluation of weakness and adult failure to thrive  Spoke with Mr. Bostock at bedside. He is a pleasant but unfortunate man who presents with pancytopenia and dehydration, generalized weakness. He states that he has lost about 40# in the past 2 months, eating little to nothing PO and could only tolerate 1 can of Osmolite 1.5 per day. He endorses a tightness in his upper chest and a "fullness" that persists throughout the day after receiving a can. He also has had consistent issues with the tube leaking "my underwear and pants would be soaked." Generalized Weakness has caused him to need help up the  stairs to get into his home. He is receiving senna-s and reglan at home. Endorses consistent, watery BMs. Per chart review, exhibits a 23#/14% severe wt loss in 2 months. Pt stated wt loss would indicate a 40#/22% severe wt loss in 2 months. He is to undergo radiation today at 2pm. Nutrition-Focused physical exam completed. Findings are severe fat depletion, severe muscle depletion, and no edema.   Discussed with him the transition to a pump, which thanks to [facility] RD, has been taken care of with hhc already. Will monitor tolerance during inpt stay.  Labs and medications reviewed: Dronabinol, Reglan, Folic Acid NS @ [id number]  Diet Order:  Diet clear liquid Room service appropriate? Yes; Fluid consistency: Thin  Skin:  Reviewed, no issues  Last BM:  PTA  Height:   Ht Readings from Last 1 Encounters:  11/14/15 5\' 5"  (1.651 m)    Weight:   Wt Readings from Last 1 Encounters:  11/14/15 140 lb (63.5 kg)    Ideal Body Weight:  56.81 kg  BMI:  Body mass index is 23.3 kg/m.  Estimated Nutritional Needs:   Kcal:  [id number]  Protein:  83-95 grams  Fluid:  >/= 1.9L  EDUCATION NEEDS:   No education needs identified at this time  Satira Anis. Shanika Levings, MS, RD LDN Inpatient Clinical Dietitian Pager [phone]

## 2015-11-16 ENCOUNTER — Encounter (HOSPITAL_COMMUNITY): Payer: Self-pay

## 2015-11-16 DIAGNOSIS — R627 Adult failure to thrive: Secondary | ICD-10-CM | POA: Diagnosis not present

## 2015-11-16 LAB — CBC WITH DIFFERENTIAL/PLATELET
BASOS PCT: 1 %
Basophils Absolute: 0 10*3/uL (ref 0.0–0.1)
EOS ABS: 0 10*3/uL (ref 0.0–0.7)
EOS PCT: 1 %
HEMATOCRIT: 26.7 % — AB (ref 39.0–52.0)
Hemoglobin: 9 g/dL — ABNORMAL LOW (ref 13.0–17.0)
LYMPHS PCT: 12 %
Lymphs Abs: 0.1 10*3/uL — ABNORMAL LOW (ref 0.7–4.0)
MCH: 28 pg (ref 26.0–34.0)
MCHC: 33.7 g/dL (ref 30.0–36.0)
MCV: 83.2 fL (ref 78.0–100.0)
MONO ABS: 0.3 10*3/uL (ref 0.1–1.0)
Monocytes Relative: 25 %
NEUTROS PCT: 61 %
Neutro Abs: 0.6 10*3/uL — ABNORMAL LOW (ref 1.7–7.7)
Platelets: 113 10*3/uL — ABNORMAL LOW (ref 150–400)
RBC: 3.21 MIL/uL — AB (ref 4.22–5.81)
RDW: 13.1 % (ref 11.5–15.5)
WBC MORPHOLOGY: INCREASED
WBC: 1 10*3/uL — AB (ref 4.0–10.5)

## 2015-11-16 LAB — BASIC METABOLIC PANEL
ANION GAP: 5 (ref 5–15)
BUN: 26 mg/dL — ABNORMAL HIGH (ref 6–20)
CALCIUM: 8.1 mg/dL — AB (ref 8.9–10.3)
CO2: 27 mmol/L (ref 22–32)
CREATININE: 0.83 mg/dL (ref 0.61–1.24)
Chloride: 110 mmol/L (ref 101–111)
Glucose, Bld: 114 mg/dL — ABNORMAL HIGH (ref 65–99)
Potassium: 3.3 mmol/L — ABNORMAL LOW (ref 3.5–5.1)
SODIUM: 142 mmol/L (ref 135–145)

## 2015-11-16 LAB — URINALYSIS, ROUTINE W REFLEX MICROSCOPIC
BILIRUBIN URINE: NEGATIVE
Glucose, UA: NEGATIVE mg/dL
Hgb urine dipstick: NEGATIVE
KETONES UR: NEGATIVE mg/dL
LEUKOCYTES UA: NEGATIVE
NITRITE: NEGATIVE
PH: 5 (ref 5.0–8.0)
Protein, ur: NEGATIVE mg/dL
SPECIFIC GRAVITY, URINE: 1.022 (ref 1.005–1.030)

## 2015-11-16 LAB — MAGNESIUM: MAGNESIUM: 1.3 mg/dL — AB (ref 1.7–2.4)

## 2015-11-16 MED ORDER — MAGNESIUM SULFATE 2 GM/50ML IV SOLN
2.0000 g | Freq: Once | INTRAVENOUS | Status: AC
  Administered 2015-11-16: 2 g via INTRAVENOUS
  Filled 2015-11-16: qty 50

## 2015-11-16 MED ORDER — POTASSIUM CHLORIDE 20 MEQ/15ML (10%) PO SOLN
40.0000 meq | Freq: Two times a day (BID) | ORAL | Status: AC
  Administered 2015-11-16 (×2): 40 meq
  Filled 2015-11-16 (×2): qty 30

## 2015-11-16 NOTE — Progress Notes (Signed)
PT Cancellation Note  Patient Details Name: [name] MRN: [id number] DOB: 1931-07-12   Cancelled Treatment:    Reason Eval/Treat Not Completed: Fatigue/lethargy limiting ability to participate (RN reports that patient requesting to sleep until 10:00. will check  back later.)   Marcelino Freestone PT [phone]  11/16/2015, 8:27 AM

## 2015-11-16 NOTE — Progress Notes (Addendum)
PROGRESS NOTE    [name]  [id number] DOB: [birth year] DOA: 11/14/2015 PCP: [name], MD   Brief Narrative: [name] is a 82 y.o. gentleman with a history of esophageal cancer (diagnosed earlier this year, not a surgical candidate, active chemotherapy and radiation treatments), CAD S/P CABG, combined systolic and diastolic heart failure (EF 35-40% and grade I diastolic dysfunction on last echo), PVD, and prior DVT who was referred to the ED from oncology clinic for evaluation of weakness and adult failure to thrive.  He is S/P four cycles of carboplatin and taxol; fifth cycle has been deferred due to his deconditioned stated.  He denies fever.  He has had a chronic cough productive of clear sputum/white sputum which is unchanged.  No hemoptysis.  He denies chest pain, pressure, or tightness.  He reports intermittent heart burn.  He has chronic shortness of breath which he relates to a prior lung surgery for a benign lung mass.  He wears 3L [place] at baseline which is chronic and unchanged.  He has had intermittent light-headedness but no syncope or LOC.  No abdominal pain.  Stools have been loose since starting tube feedings.  No dysuria.  He denies blood in his urine or stool.  Of note, he has a gastrostomy tube for primary nutrition (he takes very little by mouth anymore), and he has had recurrent problems with leaking and tube malfunction.  He also reports that he has had difficulty tolerating the recommended amount of daily tube feedings to meet his nutritional needs (should be on Osmolite 1.5, 6 cans daily with 120cc of free water with each can).  He has not been getting the recommended amount of free water either.   Assessment & Plan:   Active Problems:   Chronic respiratory failure with hypoxia (HCC)   Dehydration   Severe protein-calorie malnutrition (HCC)   PEG tube malfunction (HCC)   Adult failure to thrive   Antineoplastic chemotherapy induced pancytopenia (HCC)  Hypomagnesemia   CHF (congestive heart failure) (HCC)   CAD (coronary artery disease)   Acid reflux   Esophageal cancer patient, actively undergoing chemotherapy and radiation treatments, who presents with pancytopenia and dehydration.  Admits that intake through his PEG has been inadequate due to recurrent problems with the functioning of the tube. --Somewhat improved after IV fluids.  Will continue hydration cautiously overnight due to history of CHF. So far, he appears compensated. --UA pending  --PT eval and treat --Pepcid BID for reflux symptoms --evaluated by oncology, no Neupogen indicated unless spike fever.  --Radiation oncology planning radiation treatments next week.   Nutrition; G tube.  G tube, evaluated by IR 8-18, working. .  On enteral nutrition.   FTT;  Secondary to malignancy, poor oral intake.   Pancytopenia;  No Neupogen unless spike fever.   Diarrhea; monitor.  Hb stable.   Hypomagnesemia (borderline) --IV replacement ordered.   CAD --Continue aspirin, plavix, BB, statin for now.   --Agree with holding ARB due to renal insufficiency.  Combined CHF, compensated at this point --BNP at 175.  -NSL   DVT prophylaxis: SCD Code Status: Full code.  Family Communication: care disc Disposition Plan: to be determine. PT   Consultants:   Dr Lindi Adie , oncology   Dr Lisbeth Renshaw    Procedures:   none   Antimicrobials:   none   Subjective: Sleepy, tired today.    Objective: Vitals:   11/15/15 1900 11/15/15 2240 11/16/15 0613 11/16/15 0630  BP:  (!) 97/57 (!) 104/53  Pulse:  75 100   Resp:  18 18   Temp:  98 F (36.7 C) 98 F (36.7 C)   TempSrc:  Oral Oral   SpO2:  96% 99%   Weight: 63.6 kg (140 lb 3.2 oz)   65.5 kg (144 lb 6.4 oz)  Height:        Intake/Output Summary (Last 24 hours) at 11/16/15 1201 Last data filed at 11/16/15 1107  Gross per 24 hour  Intake             2444 ml  Output              150 ml  Net             2294 ml     Filed Weights   11/14/15 2024 11/15/15 1900 11/16/15 0630  Weight: 63.5 kg (140 lb) 63.6 kg (140 lb 3.2 oz) 65.5 kg (144 lb 6.4 oz)    Examination:  General exam: Appears calm and comfortable  Respiratory system: Clear to auscultation. Respiratory effort normal. Cardiovascular system: S1 & S2 heard, RRR. No JVD, murmurs, rubs, gallops or clicks. No pedal edema. Gastrointestinal system: Abdomen is nondistended, soft and nontender. No organomegaly or masses felt. Normal bowel sounds heard. Central nervous system: Alert and oriented. No focal neurological deficits. Extremities: Symmetric 5 x 5 power. Skin: No rashes, lesions or ulcers Psychiatry: Judgement and insight appear normal. Mood & affect appropriate.     Data Reviewed: I have personally reviewed following labs and imaging studies  CBC:  Recent Labs Lab 11/13/15 1005 11/14/15 1658 11/15/15 0537 11/16/15 0540  WBC 1.6* 1.5* 1.0* 1.0*  NEUTROABS 1.4* 1.1* 0.7* 0.6*  HGB 11.5* 12.1* 8.8* 9.0*  HCT 34.1* 35.9* 26.2* 26.7*  MCV 84.8 85.1 83.7 83.2  PLT 131* 130* 110* [id number]*   Basic Metabolic Panel:  Recent Labs Lab 11/13/15 1005 11/14/15 1658 11/15/15 0537 11/16/15 0540  NA 141 141 143 142  K 4.5 4.8 3.8 3.3*  CL  --  106 113* 110  CO2 26 26 25 27   GLUCOSE 129 105* 80 114*  BUN 37.7* 45* 38* 26*  CREATININE 1.3 1.59* 1.22 0.83  CALCIUM 9.4 8.8* 7.9* 8.1*  MG 1.6  --   --  1.3*   GFR: Estimated Creatinine Clearance: 59.7 mL/min (by C-G formula based on SCr of 0.83 mg/dL). Liver Function Tests:  Recent Labs Lab 11/13/15 1005 11/14/15 1658  AST 14 19  ALT 12 15*  ALKPHOS 50 43  BILITOT 0.86 1.0  PROT 6.3* 6.4*  ALBUMIN 3.1* 3.6    Recent Labs Lab 11/14/15 1703  LIPASE 30   No results for input(s): AMMONIA in the last 168 hours. Coagulation Profile: No results for input(s): INR, PROTIME in the last 168 hours. Cardiac Enzymes: No results for input(s): CKTOTAL, CKMB, CKMBINDEX, TROPONINI in  the last 168 hours. BNP (last 3 results) No results for input(s): PROBNP in the last 8760 hours. HbA1C: No results for input(s): HGBA1C in the last 72 hours. CBG:  Recent Labs Lab 11/15/15 0901 11/15/15 1124 11/15/15 1620 11/15/15 2201  GLUCAP 73 110* 88 122*   Lipid Profile: No results for input(s): CHOL, HDL, LDLCALC, TRIG, CHOLHDL, LDLDIRECT in the last 72 hours. Thyroid Function Tests: No results for input(s): TSH, T4TOTAL, FREET4, T3FREE, THYROIDAB in the last 72 hours. Anemia Panel: No results for input(s): VITAMINB12, FOLATE, FERRITIN, TIBC, IRON, RETICCTPCT in the last 72 hours. Sepsis Labs:  Recent Labs Lab 11/14/15 1703 11/15/15 0106  LATICACIDVEN 2.1* 0.9    No results found for this or any previous visit (from the past 240 hour(s)).       Radiology Studies: Ir Cm Inj Any Colonic Tube W/fluoro  Result Date: 11/14/2015 INDICATION: Malnutrition, gastrostomy, leakage at the skin site EXAM: GI TUBE INJECTION MEDICATIONS: None. ANESTHESIA/SEDATION: None. CONTRAST:  10 cc Isovue - administered into the gastric lumen. FLUOROSCOPY TIME:  Fluoroscopy Time:  6 seconds (1 mGy). COMPLICATIONS: None immediate. PROCEDURE: Under fluoroscopy, the existing gastrostomy was injected with contrast. This confirms position in the stomach and tube patency. No leakage or extravasation. No discontinuity. Gastrostomy was secured externally. The dressing was exchange. Mild erythema at the site secondary to skin irritation. IMPRESSION: Patent gastrostomy tube within the stomach. This was re- secured externally to prevent further leakage. Feeding tube ready for use. Electronically Signed   By: Jerilynn Mages.  Shick M.D.   On: 11/14/2015 15:16   Dg Abd Acute W/chest  Result Date: 11/14/2015 CLINICAL DATA:  Esophageal cancer, weakness, shortness of breath, dehydration, former smoker, coronary artery disease post MI, hypertension, COPD/emphysema EXAM: DG ABDOMEN ACUTE W/ 1V CHEST COMPARISON:  Chest  radiograph 05/17/2015 FINDINGS: RIGHT jugular Port-A-Cath with tip projecting over SVC. Minimal enlargement of cardiac silhouette post CABG. Atherosclerotic calcification aorta. Mediastinal contours normal. Emphysematous changes consistent with COPD. Postsurgical changes in the LEFT perihilar region. Bibasilar atelectasis. No definite infiltrate, pleural effusion or pneumothorax. Nonobstructive bowel gas pattern. No bowel dilatation or bowel wall thickening, or free air. Scattered radiodense tearing small bowel and colon. Probable bladder dysphagia. Diffuse osseous demineralization. IMPRESSION: Post CABG. COPD changes with bibasilar atelectasis. No acute abdominal findings. Aortic atherosclerosis. Electronically Signed   By: Lavonia Dana M.D.   On: 11/14/2015 17:10        Scheduled Meds: . aspirin  81 mg Oral Daily  . atorvastatin  40 mg Oral Daily  . clopidogrel  75 mg Oral Daily  . dronabinol  2.5 mg Oral BID AC  . folic acid  1 mg Oral Daily  . free water  180 mL Per Tube Q4H  . metoCLOPramide  10 mg Oral TID AC  . metoprolol tartrate  25 mg Oral Daily  . potassium chloride  40 mEq Per Tube BID  . ranitidine  150 mg Per Tube BID   Continuous Infusions: . feeding supplement (OSMOLITE 1.5 CAL) 1,000 mL (11/16/15 0037)     LOS: 0 days    Time spent: 35 minutes.     Elmarie Shiley, MD [facility] Pager [phone]  If 7PM-7AM, please contact night-coverage [email or web address] Password [email or web address] 11/16/2015, 12:01 PM

## 2015-11-16 NOTE — Progress Notes (Signed)
PT Cancellation Note  Patient Details Name: [name] MRN: [id number] DOB: 1931-11-26   Cancelled Treatment:    Reason Eval/Treat Not Completed: Fatigue/lethargy limiting ability to participate (patient states" I just want to sleep")   Claretha Cooper 11/16/2015, 3:37 PM Tresa Endo PT [phone]

## 2015-11-16 NOTE — Progress Notes (Signed)
Nursing Note: Lab called critical value of wbc=1.0.A; Result given to pt's nurse-[email or web address]

## 2015-11-17 DIAGNOSIS — E860 Dehydration: Secondary | ICD-10-CM | POA: Diagnosis not present

## 2015-11-17 DIAGNOSIS — R627 Adult failure to thrive: Secondary | ICD-10-CM | POA: Diagnosis not present

## 2015-11-17 LAB — CBC WITH DIFFERENTIAL/PLATELET
BASOS ABS: 0 10*3/uL (ref 0.0–0.1)
Basophils Relative: 0 %
EOS ABS: 0 10*3/uL (ref 0.0–0.7)
Eosinophils Relative: 1 %
HEMATOCRIT: 27.1 % — AB (ref 39.0–52.0)
HEMOGLOBIN: 9.1 g/dL — AB (ref 13.0–17.0)
LYMPHS PCT: 11 %
Lymphs Abs: 0.2 10*3/uL — ABNORMAL LOW (ref 0.7–4.0)
MCH: 28.3 pg (ref 26.0–34.0)
MCHC: 33.6 g/dL (ref 30.0–36.0)
MCV: 84.4 fL (ref 78.0–100.0)
MONOS PCT: 20 %
Monocytes Absolute: 0.4 10*3/uL (ref 0.1–1.0)
NEUTROS PCT: 68 %
Neutro Abs: 1.4 10*3/uL — ABNORMAL LOW (ref 1.7–7.7)
Platelets: 127 10*3/uL — ABNORMAL LOW (ref 150–400)
RBC: 3.21 MIL/uL — AB (ref 4.22–5.81)
RDW: 13.2 % (ref 11.5–15.5)
WBC MORPHOLOGY: INCREASED
WBC: 2 10*3/uL — AB (ref 4.0–10.5)

## 2015-11-17 LAB — BASIC METABOLIC PANEL
ANION GAP: 3 — AB (ref 5–15)
BUN: 16 mg/dL (ref 6–20)
CHLORIDE: 106 mmol/L (ref 101–111)
CO2: 28 mmol/L (ref 22–32)
CREATININE: 0.77 mg/dL (ref 0.61–1.24)
Calcium: 7.9 mg/dL — ABNORMAL LOW (ref 8.9–10.3)
GFR calc non Af Amer: >60 mL/min (ref >60)
Glucose, Bld: 126 mg/dL — ABNORMAL HIGH (ref 65–99)
POTASSIUM: 3.8 mmol/L (ref 3.5–5.1)
SODIUM: 137 mmol/L (ref 135–145)

## 2015-11-17 LAB — MAGNESIUM: MAGNESIUM: 1.4 mg/dL — AB (ref 1.7–2.4)

## 2015-11-17 MED ORDER — OSMOLITE 1.5 CAL PO LIQD
1000.0000 mL | ORAL | Status: DC
  Administered 2015-11-17 (×2): 1000 mL
  Filled 2015-11-17 (×2): qty 1000

## 2015-11-17 MED ORDER — SENNOSIDES-DOCUSATE SODIUM 8.6-50 MG PO TABS: 1.0000 | ORAL_TABLET | Freq: Two times a day (BID) | ORAL | Status: DC | PRN

## 2015-11-17 NOTE — Evaluation (Signed)
Physical Therapy Evaluation Patient Details Name: [name] MRN: [id number] DOB: 1931/07/23 Today's Date: 11/17/2015   History of Present Illness  This 82 year old man was admitted for fatique, weight loss and dehydration.  He has esophageal CA which was dx'd this year and is undergoing concurrent chemo and XRT.  He has had intermittent problems with feeding tube leaking and lightheadedness. PMH:  CABG, CAD, cardiomyopathy, PVD, emphysema, and he uses 3 liters of 02 at baseline  Clinical Impression  Pt presents with generalized weakness with decreased ability for mobility from his baseline at this time. To benefit from PT to continue to educate, strengthen, and assist with increased independence with mobility to eventually transition home safely again.     Follow Up Recommendations SNF (depending on pt progress, and pt may choose to go home )    Equipment Recommendations  None recommended by PT    Recommendations for Other Services       Precautions / Restrictions Precautions Precautions: Fall Restrictions Weight Bearing Restrictions: No      Mobility  Bed Mobility Overal bed mobility: Modified Independent             General bed mobility comments: extra time and effort, presented with some SOB with limited activity.   Transfers                 General transfer comment: not observed this session as pt got lightheaded after sitting EOB for about 5 minutes  Ambulation/Gait                Pertinent Vitals/Pain Pain Assessment: No/denies pain    Home Living Family/patient expects to be discharged to:: Private residence Living Arrangements: Children Available Help at Discharge: Family           Home Equipment: Gilford Rile - 4 wheels;Bedside commode;Shower seat - built in;Shower seat Additional Comments: pt states son is home 24/7    Prior Function Level of Independence: Independent with assistive device(s)         Comments: used RW in home and  was independent even with 02 at home. Just recetnly with this decline due to radiation and chemo treatment , per patient     Hand Dominance        Extremity/Trunk Assessment   Upper Extremity Assessment: Overall WFL for tasks assessed (grossly 4/5)           Lower Extremity Assessment: Generalized weakness         Communication   Communication: No difficulties  Cognition Arousal/Alertness: Awake/alert Behavior During Therapy: WFL for tasks assessed/performed Overall Cognitive Status: Within Functional Limits for tasks assessed               Assessment/Plan    PT Assessment Patient needs continued PT services  PT Diagnosis Generalized weakness   PT Problem List Decreased strength;Decreased activity tolerance  PT Treatment Interventions Gait training;Functional mobility training;Patient/family education   PT Goals (Current goals can be found in the Care Plan section) Acute Rehab PT Goals Patient Stated Goal: I think I will get stronger when I can eat a little more, this radiation and chemo has taken it out of me.. I feel nauseated and dizzy.  PT Goal Formulation: With patient Time For Goal Achievement: 12/01/15 Potential to Achieve Goals: Good    Frequency  3x/week   Barriers to discharge        Co-evaluation PT/OT/SLP Co-Evaluation/Treatment: Yes Reason for Co-Treatment: Complexity of the patient's impairments (multi-system involvement) (pt was not  tolerating a lot of activity, therefore co tx for assessment and pt tolerance. ) PT goals addressed during session: Mobility/safety with mobility OT goals addressed during session: Strengthening/ROM       End of Session   Activity Tolerance: Patient limited by fatigue (limited due to pt reported lightheadness at EOB after sitting for about 5 minutes and choose to lay down immediately without [place] checking BP , etc.. So eval was limited for PT. ) Patient left: in bed Nurse Communication: Mobility status     Functional Assessment Tool Used: clinical judgement  Functional Limitation: Mobility: Walking and moving around Mobility: Walking and Moving Around Current Status [id number]): At least 40 percent but less than 60 percent impaired, limited or restricted Mobility: Walking and Moving Around Goal Status [phone]): At least 1 percent but less than 20 percent impaired, limited or restricted    Time: 1128-1150 PT Time Calculation (min) (ACUTE ONLY): 22 min   Charges:   PT Evaluation $PT Eval Low Complexity: 1 Procedure     PT G Codes:   PT G-Codes **NOT FOR INPATIENT CLASS** Functional Assessment Tool Used: clinical judgement  Functional Limitation: Mobility: Walking and moving around Mobility: Walking and Moving Around Current Status [id number]): At least 40 percent but less than 60 percent impaired, limited or restricted Mobility: Walking and Moving Around Goal Status [phone]): At least 1 percent but less than 20 percent impaired, limited or restricted    Clide Dales 11/17/2015, 12:53 PM  Clide Dales, PT Pager: [phone] 11/17/2015

## 2015-11-17 NOTE — Progress Notes (Signed)
Nutrition Follow-up  DOCUMENTATION CODES:   Severe malnutrition in context of chronic illness  INTERVENTION:   Monitor magnesium, potassium, and phosphorus daily for at least 3 days, MD to replete as needed, as pt is at risk for refeeding syndrome.  Continue Osmolite 1.5 @ 30 ml. If tolerating with no further complaints, advance to 40 ml/hr. Continue 180 ml of free water every 4 hours This will provide 1440 kcal, 60 g protein and 1811 ml H2O.  Goal rate:  Osmolite 1.5 @ 60 ml/hr. Provides 2160 calories, 90gm pro, and  2177 ml free water (meets 100% of all estimated needs)  RD to continue to monitor  NUTRITION DIAGNOSIS:   Malnutrition related to chronic illness as evidenced by energy intake < 75% for > or equal to 1 month, severe depletion of muscle mass, severe depletion of body fat, percent weight loss.  Ongoing.  GOAL:   Patient will meet greater than or equal to 90% of their needs  Progressing.  MONITOR:   PO intake, Labs, TF tolerance, I & O's, Skin, Weight trends  ASSESSMENT:   [name] is a 82 y.o. gentleman with a history of esophageal cancer (diagnosed earlier this year, not a surgical candidate, active chemotherapy and radiation treatments), CAD S/P CABG, combined systolic and diastolic heart failure (EF 35-40% and grade I diastolic dysfunction on last echo), PVD, and prior DVT who was referred to the ED from oncology clinic for evaluation of weakness and adult failure to thrive  RN contacted RD stating that pt continues to c/o feelings of fullness with continuous feedings. RN checked gastric residuals, only 60 cc noted. RN feels that potassium supplementation may be causing some stomach discomfort, will d/c. K levels are WNL now.  RD spoke with pt and he reports since RN checking gastric residuals he feels better with no complaints at this time. He feels comfortable with 30 ml/hr. States he had some ice cream today, pt on clear liquids. RD stated to patient  that if he continues to not having any complaints of fullness, TF will advance to 40 ml/hr.  Pt is receiving Reglan and free water with no issue.  Medications: Marinol capsule BID, Folic acid tablet daily, Reglan tablet TID, Zofran PRN Labs reviewed: CBGs: 122 Low Mg  Diet Order:  Diet clear liquid Room service appropriate? Yes; Fluid consistency: Thin  Skin:  Reviewed, no issues  Last BM:  8/18  Height:   Ht Readings from Last 1 Encounters:  11/14/15 5\' 5"  (1.651 m)    Weight:   Wt Readings from Last 1 Encounters:  11/16/15 144 lb 6.4 oz (65.5 kg)    Ideal Body Weight:  56.81 kg  BMI:  Body mass index is 24.03 kg/m.  Estimated Nutritional Needs:   Kcal:  [id number]  Protein:  83-95 grams  Fluid:  >/= 1.9L  EDUCATION NEEDS:   No education needs identified at this time  Clayton Bibles, MS, RD, LDN Pager: [phone] After Hours Pager: [phone]

## 2015-11-17 NOTE — Progress Notes (Signed)
PROGRESS NOTE    [name]  [id number] DOB: July 05, 1931 DOA: 11/14/2015 PCP: [name], MD   Brief Narrative: [name] is a 82 y.o. gentleman with a history of esophageal cancer (diagnosed earlier this year, not a surgical candidate, active chemotherapy and radiation treatments), CAD S/P CABG, combined systolic and diastolic heart failure (EF 35-40% and grade I diastolic dysfunction on last echo), PVD, and prior DVT who was referred to the ED from oncology clinic for evaluation of weakness and adult failure to thrive.  He is S/P four cycles of carboplatin and taxol; fifth cycle has been deferred due to his deconditioned stated.  He denies fever.  He has had a chronic cough productive of clear sputum/white sputum which is unchanged.  No hemoptysis.  He denies chest pain, pressure, or tightness.  He reports intermittent heart burn.  He has chronic shortness of breath which he relates to a prior lung surgery for a benign lung mass.  He wears 3L [place] at baseline which is chronic and unchanged.  He has had intermittent light-headedness but no syncope or LOC.  No abdominal pain.  Stools have been loose since starting tube feedings.  No dysuria.  He denies blood in his urine or stool.  Of note, he has a gastrostomy tube for primary nutrition (he takes very little by mouth anymore), and he has had recurrent problems with leaking and tube malfunction.  He also reports that he has had difficulty tolerating the recommended amount of daily tube feedings to meet his nutritional needs (should be on Osmolite 1.5, 6 cans daily with 120cc of free water with each can).  He has not been getting the recommended amount of free water either.   Assessment & Plan:   Active Problems:   Chronic respiratory failure with hypoxia (HCC)   Dehydration   Severe protein-calorie malnutrition (HCC)   PEG tube malfunction (HCC)   Adult failure to thrive   Antineoplastic chemotherapy induced pancytopenia (HCC)  Hypomagnesemia   CHF (congestive heart failure) (HCC)   CAD (coronary artery disease)   Acid reflux   Esophageal cancer patient, actively undergoing chemotherapy and radiation treatments, who presents with pancytopenia and dehydration.  Admits that intake through his PEG has been inadequate due to recurrent problems with the functioning of the tube. --Somewhat improved after IV fluids.  Will continue hydration cautiously overnight due to history of CHF. So far, he appears compensated. --UA pending  --PT eval and treat --Pepcid BID for reflux symptoms --evaluated by oncology, no Neupogen indicated unless spike fever.  --Radiation oncology planning radiation treatments next week.   Nutrition; G tube.  G tube, evaluated by IR 8-18, working. .  On enteral nutrition.  Nutritionist to follow today, adjust tube feeding as needed.   FTT;  Secondary to malignancy, poor oral intake.  Check TSH.   Pancytopenia;  No Neupogen unless spike fever.  WBC increasing.  HB stable.   Diarrhea; monitor. Resolved.   Hypomagnesemia (borderline) --IV replacement ordered.  Repeat labs today and tomorrow.   CAD --Continue aspirin, plavix, BB, statin for now.   --Agree with holding ARB due to renal insufficiency.  Combined CHF, compensated at this point --BNP at 175.  -NSL   DVT prophylaxis: SCD Code Status: Full code.  Family Communication: care disc Disposition Plan: to be determine. PT   Consultants:   Dr Lindi Adie , oncology   Dr Lisbeth Renshaw    Procedures:   none   Antimicrobials:   none   Subjective: Feels weak, does  not want to work with PT.  He think he is getting too much tube feeding. Feels distended.     Objective: Vitals:   11/16/15 0613 11/16/15 0630 11/16/15 1412 11/16/15 2014  BP: (!) 104/53  (!) 103/57 123/68  Pulse: 100  96 98  Resp: 18  16 18   Temp: 98 F (36.7 C)  98.7 F (37.1 C) 98.4 F (36.9 C)  TempSrc: Oral  Oral Oral  SpO2: 99%  99% 100%    Weight:  65.5 kg (144 lb 6.4 oz)    Height:        Intake/Output Summary (Last 24 hours) at 11/17/15 1229 Last data filed at 11/17/15 1014  Gross per 24 hour  Intake              640 ml  Output              290 ml  Net              350 ml   Filed Weights   11/14/15 2024 11/15/15 1900 11/16/15 0630  Weight: 63.5 kg (140 lb) 63.6 kg (140 lb 3.2 oz) 65.5 kg (144 lb 6.4 oz)    Examination:  General exam: Appears calm and comfortable  Respiratory system: Clear to auscultation. Respiratory effort normal. Cardiovascular system: S1 & S2 heard, RRR. No JVD, murmurs, rubs, gallops or clicks. No pedal edema. Gastrointestinal system: Abdomen is nondistended, soft and nontender. No organomegaly or masses felt. Normal bowel sounds heard. Central nervous system: Alert and oriented. No focal neurological deficits. Extremities: Symmetric 5 x 5 power. Skin: No rashes, lesions or ulcers Psychiatry: Judgement and insight appear normal. Mood & affect appropriate.     Data Reviewed: I have personally reviewed following labs and imaging studies  CBC:  Recent Labs Lab 11/13/15 1005 11/14/15 1658 11/15/15 0537 11/16/15 0540 11/17/15 0500  WBC 1.6* 1.5* 1.0* 1.0* 2.0*  NEUTROABS 1.4* 1.1* 0.7* 0.6* 1.4*  HGB 11.5* 12.1* 8.8* 9.0* 9.1*  HCT 34.1* 35.9* 26.2* 26.7* 27.1*  MCV 84.8 85.1 83.7 83.2 84.4  PLT 131* 130* 110* 113* [id number]   Basic Metabolic Panel:  Recent Labs Lab 11/13/15 1005 11/14/15 1658 11/15/15 0537 11/16/15 0540 11/17/15 0500  NA 141 141 143 142 137  K 4.5 4.8 3.8 3.3* 3.8  CL  --  106 113* 110 106  CO2 26 26 25 27 28   GLUCOSE 129 105* 80 114* 126*  BUN 37.7* 45* 38* 26* 16  CREATININE 1.3 1.59* 1.22 0.83 0.77  CALCIUM 9.4 8.8* 7.9* 8.1* 7.9*  MG 1.6  --   --  1.3*  --    GFR: Estimated Creatinine Clearance: 61.9 mL/min (by C-G formula based on SCr of 0.8 mg/dL). Liver Function Tests:  Recent Labs Lab 11/13/15 1005 11/14/15 1658  AST 14 19  ALT 12 15*   ALKPHOS 50 43  BILITOT 0.86 1.0  PROT 6.3* 6.4*  ALBUMIN 3.1* 3.6    Recent Labs Lab 11/14/15 1703  LIPASE 30   No results for input(s): AMMONIA in the last 168 hours. Coagulation Profile: No results for input(s): INR, PROTIME in the last 168 hours. Cardiac Enzymes: No results for input(s): CKTOTAL, CKMB, CKMBINDEX, TROPONINI in the last 168 hours. BNP (last 3 results) No results for input(s): PROBNP in the last 8760 hours. HbA1C: No results for input(s): HGBA1C in the last 72 hours. CBG:  Recent Labs Lab 11/15/15 0901 11/15/15 1124 11/15/15 1620 11/15/15 2201  GLUCAP 73 110* 88 122*  Lipid Profile: No results for input(s): CHOL, HDL, LDLCALC, TRIG, CHOLHDL, LDLDIRECT in the last 72 hours. Thyroid Function Tests: No results for input(s): TSH, T4TOTAL, FREET4, T3FREE, THYROIDAB in the last 72 hours. Anemia Panel: No results for input(s): VITAMINB12, FOLATE, FERRITIN, TIBC, IRON, RETICCTPCT in the last 72 hours. Sepsis Labs:  Recent Labs Lab 11/14/15 1703 11/15/15 0106  LATICACIDVEN 2.1* 0.9    No results found for this or any previous visit (from the past 240 hour(s)).       Radiology Studies: No results found.      Scheduled Meds: . aspirin  81 mg Oral Daily  . atorvastatin  40 mg Oral Daily  . clopidogrel  75 mg Oral Daily  . dronabinol  2.5 mg Oral BID AC  . folic acid  1 mg Oral Daily  . free water  180 mL Per Tube Q4H  . metoCLOPramide  10 mg Oral TID AC  . metoprolol tartrate  25 mg Oral Daily  . ranitidine  150 mg Per Tube BID   Continuous Infusions: . feeding supplement (OSMOLITE 1.5 CAL)       LOS: 0 days    Time spent: 35 minutes.     Elmarie Shiley, MD [facility] Pager [phone]  If 7PM-7AM, please contact night-coverage [email or web address] Password [facility] 11/17/2015, 12:29 PM

## 2015-11-17 NOTE — Evaluation (Signed)
Occupational Therapy Evaluation Patient Details Name: [name] MRN: [id number] DOB: 09-Oct-1931 Today's Date: 11/17/2015    History of Present Illness This 82 year old man was admitted for fatique, weight loss and dehydration.  He has esophageal CA which was dx'd this year and is undergoing concurrent chemo and XRT.  He has had intermittent problems with feeding tube leaking and lightheadedness. PMH:  CABG, CAD, cardiomyopathy, PVD, emphysema, and he uses 3 liters of 02 at baseline   Clinical Impression   Pt was admitted for the above.  He has decreased activity tolerance at this time affecting adls and mobility.  He was mod I prior to admission.  He will benefit from continued OT to increase endurance to regain independence with adls.      Follow Up Recommendations  SNF;Supervision/Assistance - 24 hour    Equipment Recommendations    None recommended OT   Recommendations for Other Services       Precautions / Restrictions Precautions Precautions: Fall Restrictions Weight Bearing Restrictions: No      Mobility Bed Mobility Overal bed mobility: Modified Independent             General bed mobility comments: extra time and effort  Transfers                 General transfer comment: not observed this session as pt got lightheaded after sitting EOB for about 5 minutes.  Pt has been performing SPT to 3:1 with +1 with nursing    Balance                                            ADL Overall ADL's : Needs assistance/impaired     Grooming: Set up;Sitting   Upper Body Bathing: Minimal assitance;Sitting   Lower Body Bathing: Moderate assistance;Sit to/from stand   Upper Body Dressing : Minimal assistance;Sitting   Lower Body Dressing: Maximal assistance;Sit to/from stand                 General ADL Comments: Pt sat at EOB at mod I level, extra time and effort.  He sat for about 5 minutes and became lightheaded. Unable to get BP  before he needed to lie back down.  Pt can reach to feet, however, he has poor endurance/activity tolerance affecting ADLs.  ADL levels based on clinical judgment.  Pt has been getting up to [facility] with NT with +1 assistance; did not see sit to stand this session.    Began education on energy conservation as well as short bursts of activity to build activity tolerance. Encouraged him to sit EOB x 5 minutes when NT/RN in room and trying to sit in recliner x 30 minutes with assistance to get over to it.     Vision     Perception     Praxis      Pertinent Vitals/Pain Pain Assessment: No/denies pain     Hand Dominance     Extremity/Trunk Assessment Upper Extremity Assessment Upper Extremity Assessment: Overall WFL for tasks assessed (grossly 4/5)           Communication Communication Communication: No difficulties   Cognition Arousal/Alertness: Awake/alert Behavior During Therapy: WFL for tasks assessed/performed Overall Cognitive Status: Within Functional Limits for tasks assessed                     General Comments  Exercises       Shoulder Instructions      Home Living Family/patient expects to be discharged to:: Private residence Living Arrangements: Children Available Help at Discharge: Family               Bathroom Shower/Tub: Walk-in Corporate treasurer Toilet: Standard     Home Equipment: Environmental consultant - 4 wheels;Bedside commode;Shower seat - built in;Shower seat   Additional Comments: pt states son is home 24/7      Prior Functioning/Environment Level of Independence: Independent with assistive device(s)        Comments: only got into shower when he felt up to it    OT Diagnosis: Generalized weakness   OT Problem List: Decreased strength;Decreased activity tolerance;Decreased knowledge of use of DME or AE;Cardiopulmonary status limiting activity (likely standing balance; sensation NT for LE's)   OT Treatment/Interventions: Self-care/ADL  training;DME and/or AE instruction;Patient/family education;Balance training;Therapeutic activities;Energy conservation    OT Goals(Current goals can be found in the care plan section) Acute Rehab OT Goals Patient Stated Goal: home with son OT Goal Formulation: With patient Time For Goal Achievement: 11/24/15 Potential to Achieve Goals: Good ADL Goals Pt Will Perform Upper Body Bathing: (P) with set-up;sitting Pt Will Perform Lower Body Bathing: (P) with min assist;sit to/from stand Pt Will Perform Lower Body Dressing: (P) with mod assist;sit to/from stand Pt Will Transfer to Toilet: (P) with min assist;ambulating;bedside commode Pt Will Perform Toileting - Clothing Manipulation and hygiene: (P) with min guard assist;sit to/from stand Additional ADL Goal #1: (P) Pt will verbalize 3 energy conservation strategies Additional ADL Goal #2: (P) pt will follow level one theraband program from bed level to increase activity tolerance   OT Frequency: Min 2X/week   Barriers to D/C:            Co-evaluation PT/OT/SLP Co-Evaluation/Treatment: Yes     OT goals addressed during session: Strengthening/ROM      End of Session    Activity Tolerance: Patient limited by fatigue (and lightheadedness) Patient left: in bed;with call bell/phone within reach   Time: [id number] OT Time Calculation (min): 23 min Charges:  OT General Charges $OT Visit: 1 Procedure OT Evaluation $OT Eval Moderate Complexity: 1 Procedure G-Codes: OT G-codes **NOT FOR INPATIENT CLASS** Functional Assessment Tool Used: clinical judgment Functional Limitation: Self care Self Care Current Status [id number]): At least 60 percent but less than 80 percent impaired, limited or restricted Self Care Goal Status [id number]): At least 40 percent but less than 60 percent impaired, limited or restricted  Caroleann Casler 11/17/2015, 12:32 PM Lesle Chris, OTR/L [phone] 11/17/2015

## 2015-11-18 ENCOUNTER — Ambulatory Visit
Admission: RE | Admit: 2015-11-18 | Discharge: 2015-11-18 | Disposition: A | Discharge status: Home / Self Care | Payer: Medicare Other | Source: Ambulatory Visit | Attending: Radiation Oncology | Admitting: Radiation Oncology

## 2015-11-18 ENCOUNTER — Observation Stay (HOSPITAL_COMMUNITY): Payer: Medicare Other

## 2015-11-18 DIAGNOSIS — I2510 Atherosclerotic heart disease of native coronary artery without angina pectoris: Secondary | ICD-10-CM | POA: Diagnosis present

## 2015-11-18 DIAGNOSIS — K9423 Gastrostomy malfunction: Secondary | ICD-10-CM | POA: Diagnosis present

## 2015-11-18 DIAGNOSIS — C159 Malignant neoplasm of esophagus, unspecified: Secondary | ICD-10-CM | POA: Diagnosis present

## 2015-11-18 DIAGNOSIS — I5042 Chronic combined systolic (congestive) and diastolic (congestive) heart failure: Secondary | ICD-10-CM | POA: Diagnosis present

## 2015-11-18 DIAGNOSIS — Z510 Encounter for antineoplastic radiation therapy: Secondary | ICD-10-CM | POA: Diagnosis present

## 2015-11-18 DIAGNOSIS — Y833 Surgical operation with formation of external stoma as the cause of abnormal reaction of the patient, or of later complication, without mention of misadventure at the time of the procedure: Secondary | ICD-10-CM | POA: Diagnosis present

## 2015-11-18 DIAGNOSIS — D61810 Antineoplastic chemotherapy induced pancytopenia: Secondary | ICD-10-CM | POA: Diagnosis present

## 2015-11-18 DIAGNOSIS — K219 Gastro-esophageal reflux disease without esophagitis: Secondary | ICD-10-CM | POA: Diagnosis not present

## 2015-11-18 DIAGNOSIS — E43 Unspecified severe protein-calorie malnutrition: Secondary | ICD-10-CM | POA: Diagnosis present

## 2015-11-18 DIAGNOSIS — N289 Disorder of kidney and ureter, unspecified: Secondary | ICD-10-CM | POA: Diagnosis present

## 2015-11-18 DIAGNOSIS — R627 Adult failure to thrive: Secondary | ICD-10-CM | POA: Diagnosis not present

## 2015-11-18 DIAGNOSIS — R531 Weakness: Secondary | ICD-10-CM | POA: Diagnosis present

## 2015-11-18 DIAGNOSIS — R1312 Dysphagia, oropharyngeal phase: Secondary | ICD-10-CM | POA: Diagnosis present

## 2015-11-18 DIAGNOSIS — I110 Hypertensive heart disease with heart failure: Secondary | ICD-10-CM | POA: Diagnosis present

## 2015-11-18 DIAGNOSIS — C155 Malignant neoplasm of lower third of esophagus: Secondary | ICD-10-CM | POA: Diagnosis present

## 2015-11-18 DIAGNOSIS — E860 Dehydration: Secondary | ICD-10-CM | POA: Diagnosis present

## 2015-11-18 DIAGNOSIS — E871 Hypo-osmolality and hyponatremia: Secondary | ICD-10-CM | POA: Diagnosis present

## 2015-11-18 DIAGNOSIS — J449 Chronic obstructive pulmonary disease, unspecified: Secondary | ICD-10-CM | POA: Diagnosis present

## 2015-11-18 DIAGNOSIS — I429 Cardiomyopathy, unspecified: Secondary | ICD-10-CM | POA: Diagnosis present

## 2015-11-18 DIAGNOSIS — J9611 Chronic respiratory failure with hypoxia: Secondary | ICD-10-CM | POA: Diagnosis present

## 2015-11-18 DIAGNOSIS — I739 Peripheral vascular disease, unspecified: Secondary | ICD-10-CM | POA: Diagnosis present

## 2015-11-18 LAB — BASIC METABOLIC PANEL
ANION GAP: 5 (ref 5–15)
BUN: 14 mg/dL (ref 6–20)
CALCIUM: 7.9 mg/dL — AB (ref 8.9–10.3)
CO2: 29 mmol/L (ref 22–32)
Chloride: 99 mmol/L — ABNORMAL LOW (ref 101–111)
Creatinine, Ser: 0.67 mg/dL (ref 0.61–1.24)
Glucose, Bld: 128 mg/dL — ABNORMAL HIGH (ref 65–99)
POTASSIUM: 3.6 mmol/L (ref 3.5–5.1)
SODIUM: 133 mmol/L — AB (ref 135–145)

## 2015-11-18 LAB — GLUCOSE, CAPILLARY
GLUCOSE-CAPILLARY: 101 mg/dL — AB (ref 65–99)
GLUCOSE-CAPILLARY: 108 mg/dL — AB (ref 65–99)
GLUCOSE-CAPILLARY: 110 mg/dL — AB (ref 65–99)
GLUCOSE-CAPILLARY: 112 mg/dL — AB (ref 65–99)
GLUCOSE-CAPILLARY: 112 mg/dL — AB (ref 65–99)
GLUCOSE-CAPILLARY: 116 mg/dL — AB (ref 65–99)
GLUCOSE-CAPILLARY: 128 mg/dL — AB (ref 65–99)
GLUCOSE-CAPILLARY: 139 mg/dL — AB (ref 65–99)
GLUCOSE-CAPILLARY: 97 mg/dL (ref 65–99)
Glucose-Capillary: 114 mg/dL — ABNORMAL HIGH (ref 65–99)
Glucose-Capillary: 114 mg/dL — ABNORMAL HIGH (ref 65–99)
Glucose-Capillary: 118 mg/dL — ABNORMAL HIGH (ref 65–99)
Glucose-Capillary: 88 mg/dL (ref 65–99)
Glucose-Capillary: 115 mg/dL — ABNORMAL HIGH (ref 65–99)
Glucose-Capillary: 92 mg/dL (ref 65–99)

## 2015-11-18 LAB — CBC WITH DIFFERENTIAL/PLATELET
BASOS ABS: 0 10*3/uL (ref 0.0–0.1)
Basophils Relative: 0 %
EOS ABS: 0 10*3/uL (ref 0.0–0.7)
EOS PCT: 1 %
HCT: 27.4 % — ABNORMAL LOW (ref 39.0–52.0)
Hemoglobin: 9.4 g/dL — ABNORMAL LOW (ref 13.0–17.0)
Lymphocytes Relative: 9 %
Lymphs Abs: 0.3 10*3/uL — ABNORMAL LOW (ref 0.7–4.0)
MCH: 28.5 pg (ref 26.0–34.0)
MCHC: 34.3 g/dL (ref 30.0–36.0)
MCV: 83 fL (ref 78.0–100.0)
MONO ABS: 0.5 10*3/uL (ref 0.1–1.0)
Monocytes Relative: 15 %
NEUTROS PCT: 75 %
Neutro Abs: 2.5 10*3/uL (ref 1.7–7.7)
PLATELETS: 133 10*3/uL — AB (ref 150–400)
RBC: 3.3 MIL/uL — AB (ref 4.22–5.81)
RDW: 13.2 % (ref 11.5–15.5)
WBC: 3.3 10*3/uL — AB (ref 4.0–10.5)

## 2015-11-18 LAB — TSH: TSH: 3.885 u[IU]/mL (ref 0.350–4.500)

## 2015-11-18 LAB — MAGNESIUM: MAGNESIUM: 1.3 mg/dL — AB (ref 1.7–2.4)

## 2015-11-18 MED ORDER — SODIUM CHLORIDE 0.9 % IV SOLN
INTRAVENOUS | Status: DC
  Administered 2015-11-18 – 2015-11-20 (×4): via INTRAVENOUS

## 2015-11-18 MED ORDER — VITAL 1.5 CAL PO LIQD
1000.0000 mL | ORAL | Status: DC
  Administered 2015-11-18: 1000 mL
  Filled 2015-11-18 (×2): qty 1000

## 2015-11-18 MED ORDER — MAGNESIUM SULFATE 2 GM/50ML IV SOLN
2.0000 g | Freq: Once | INTRAVENOUS | Status: AC
  Administered 2015-11-18: 2 g via INTRAVENOUS
  Filled 2015-11-18: qty 50

## 2015-11-18 NOTE — Progress Notes (Signed)
PT Cancellation Note  Patient Details Name: Alfredo Diekmann MRN: [id number] DOB: [birth year]   Cancelled Treatment:    Reason Eval/Treat Not Completed: Fatigue/lethargy limiting ability to participate Pt reports feeling too full (G-tube running)  And declines to participate at this time.    Kimmy Parish,KATHrine E 11/18/2015, 11:37 AM Carmelia Bake, PT, DPT 11/18/2015 Pager: [phone]

## 2015-11-18 NOTE — Progress Notes (Signed)
PROGRESS NOTE    [name]  [id number] DOB: 02-16-1932 DOA: 11/14/2015 PCP: [name], MD   Brief Narrative: [name] is a 82 y.o. gentleman with a history of esophageal cancer (diagnosed earlier this year, not a surgical candidate, active chemotherapy and radiation treatments), CAD S/P CABG, combined systolic and diastolic heart failure (EF 35-40% and grade I diastolic dysfunction on last echo), PVD, and prior DVT who was referred to the ED from oncology clinic for evaluation of weakness and adult failure to thrive.  He is S/P four cycles of carboplatin and taxol; fifth cycle has been deferred due to his deconditioned stated.  He denies fever.  He has had a chronic cough productive of clear sputum/white sputum which is unchanged.  No hemoptysis.  He denies chest pain, pressure, or tightness.  He reports intermittent heart burn.  He has chronic shortness of breath which he relates to a prior lung surgery for a benign lung mass.  He wears 3L [place] at baseline which is chronic and unchanged.  He has had intermittent light-headedness but no syncope or LOC.  No abdominal pain.  Stools have been loose since starting tube feedings.  No dysuria.  He denies blood in his urine or stool.  Of note, he has a gastrostomy tube for primary nutrition (he takes very little by mouth anymore), and he has had recurrent problems with leaking and tube malfunction.  He also reports that he has had difficulty tolerating the recommended amount of daily tube feedings to meet his nutritional needs (should be on Osmolite 1.5, 6 cans daily with 120cc of free water with each can).  He has not been getting the recommended amount of free water either.   Assessment & Plan:   Active Problems:   Chronic respiratory failure with hypoxia (HCC)   Dehydration   Severe protein-calorie malnutrition (HCC)   PEG tube malfunction (HCC)   Adult failure to thrive   Antineoplastic chemotherapy induced pancytopenia (HCC)  Hypomagnesemia   CHF (congestive heart failure) (HCC)   CAD (coronary artery disease)   Acid reflux   Esophageal cancer patient, actively undergoing chemotherapy and radiation treatments, who presents with pancytopenia and dehydration.  Admits that intake through his PEG has been inadequate due to recurrent problems with the functioning of the tube. --Somewhat improved after IV fluids.  Will continue hydration cautiously overnight due to history of CHF. So far, he appears compensated. --PT eval and treat --Pepcid BID for reflux symptoms --Evaluated by oncology, no Neupogen indicated unless spike fever.  --Radiation oncology planning radiation treatments next week.   Nutrition; G tube.  G tube, evaluated by IR 8-18, working. .  On enteral nutrition.  Plan to change tube feeding to V[place].   Hyponatremia; start IV fluids,. He vomited last night.  Repeat labs in am.   FTT;  Secondary to malignancy, poor oral intake.  Check TSH.   Pancytopenia;  No Neupogen unless spike fever.  WBC increasing.  HB stable.   Diarrhea; monitor. Resolved.   Hypomagnesemia (borderline) --IV replacement ordered.  --Replete IV.   CAD --Continue aspirin, plavix, BB, statin for now.   --Agree with holding ARB due to renal insufficiency.  Combined CHF, compensated at this point --BNP at 175.  -monitor on IV fluids.   DVT prophylaxis: SCD Code Status: Full code.  Family Communication: care disc Disposition Plan: decline SNF   Consultants:   Dr Lindi Adie , oncology   Dr Lisbeth Renshaw    Procedures:   none   Antimicrobials:  none   Subjective: Feels weak, abdominal distension.  He vomit last night.  Had bm yesterday. Just came from radiation.     Objective: Vitals:   11/17/15 1500 11/17/15 2008 11/18/15 0545 11/18/15 1232  BP:  99/63 107/63 (!) 108/57  Pulse:  71 (!) 103 97  Resp:  18 20 18   Temp:  97.5 F (36.4 C) 97.9 F (36.6 C) 99.1 F (37.3 C)  TempSrc:  Oral Oral Oral    SpO2:  100% 100% 100%  Weight: 67.2 kg (148 lb 2.4 oz)     Height:        Intake/Output Summary (Last 24 hours) at 11/18/15 1440 Last data filed at 11/18/15 1417  Gross per 24 hour  Intake                0 ml  Output               70 ml  Net              -70 ml   Filed Weights   11/15/15 1900 11/16/15 0630 11/17/15 1500  Weight: 63.6 kg (140 lb 3.2 oz) 65.5 kg (144 lb 6.4 oz) 67.2 kg (148 lb 2.4 oz)    Examination:  General exam: Appears calm and comfortable  Respiratory system: Clear to auscultation. Respiratory effort normal. Cardiovascular system: S1 & S2 heard, RRR. No JVD, murmurs, rubs, gallops or clicks. No pedal edema. Gastrointestinal system: Abdomen is nondistended, soft and nontender. No organomegaly or masses felt. Normal bowel sounds heard. Central nervous system: Alert and oriented. No focal neurological deficits. Extremities: Symmetric 5 x 5 power. Skin: No rashes, lesions or ulcers Psychiatry: Judgement and insight appear normal. Mood & affect appropriate.     Data Reviewed: I have personally reviewed following labs and imaging studies  CBC:  Recent Labs Lab 11/14/15 1658 11/15/15 0537 11/16/15 0540 11/17/15 0500 11/18/15 0500  WBC 1.5* 1.0* 1.0* 2.0* 3.3*  NEUTROABS 1.1* 0.7* 0.6* 1.4* 2.5  HGB 12.1* 8.8* 9.0* 9.1* 9.4*  HCT 35.9* 26.2* 26.7* 27.1* 27.4*  MCV 85.1 83.7 83.2 84.4 83.0  PLT 130* 110* 113* 127* [id number]*   Basic Metabolic Panel:  Recent Labs Lab 11/13/15 1005 11/14/15 1658 11/15/15 0537 11/16/15 0540 11/17/15 0500 11/18/15 0500  NA 141 141 143 142 137 133*  K 4.5 4.8 3.8 3.3* 3.8 3.6  CL  --  106 113* 110 106 99*  CO2 26 26 25 27 28 29   GLUCOSE 129 105* 80 114* 126* 128*  BUN 37.7* 45* 38* 26* 16 14  CREATININE 1.3 1.59* 1.22 0.83 0.77 0.67  CALCIUM 9.4 8.8* 7.9* 8.1* 7.9* 7.9*  MG 1.6  --   --  1.3* 1.4* 1.3*   GFR: Estimated Creatinine Clearance: 61.9 mL/min (by C-G formula based on SCr of 0.8 mg/dL). Liver Function  Tests:  Recent Labs Lab 11/13/15 1005 11/14/15 1658  AST 14 19  ALT 12 15*  ALKPHOS 50 43  BILITOT 0.86 1.0  PROT 6.3* 6.4*  ALBUMIN 3.1* 3.6    Recent Labs Lab 11/14/15 1703  LIPASE 30   No results for input(s): AMMONIA in the last 168 hours. Coagulation Profile: No results for input(s): INR, PROTIME in the last 168 hours. Cardiac Enzymes: No results for input(s): CKTOTAL, CKMB, CKMBINDEX, TROPONINI in the last 168 hours. BNP (last 3 results) No results for input(s): PROBNP in the last 8760 hours. HbA1C: No results for input(s): HGBA1C in the last 72 hours. CBG:  Recent Labs Lab 11/17/15 1212 11/17/15 1640 11/17/15 1957 11/18/15 0039 11/18/15 0839  GLUCAP 118* 88 112* 108* 115*   Lipid Profile: No results for input(s): CHOL, HDL, LDLCALC, TRIG, CHOLHDL, LDLDIRECT in the last 72 hours. Thyroid Function Tests: No results for input(s): TSH, T4TOTAL, FREET4, T3FREE, THYROIDAB in the last 72 hours. Anemia Panel: No results for input(s): VITAMINB12, FOLATE, FERRITIN, TIBC, IRON, RETICCTPCT in the last 72 hours. Sepsis Labs:  Recent Labs Lab 11/14/15 1703 11/15/15 0106  LATICACIDVEN 2.1* 0.9    No results found for this or any previous visit (from the past 240 hour(s)).       Radiology Studies: No results found.      Scheduled Meds: . aspirin  81 mg Oral Daily  . atorvastatin  40 mg Oral Daily  . clopidogrel  75 mg Oral Daily  . dronabinol  2.5 mg Oral BID AC  . folic acid  1 mg Oral Daily  . free water  180 mL Per Tube Q4H  . magnesium sulfate 1 - 4 g bolus IVPB  2 g Intravenous Once  . metoCLOPramide  10 mg Oral TID AC  . metoprolol tartrate  25 mg Oral Daily  . ranitidine  150 mg Per Tube BID   Continuous Infusions: . sodium chloride 75 mL/hr at 11/18/15 1349  . feeding supplement (VITAL 1.5 CAL)       LOS: 0 days    Time spent: 35 minutes.     Elmarie Shiley, MD [facility] Pager [phone]  If 7PM-7AM, please  contact night-coverage [email or web address] Password [email or web address] 11/18/2015, 2:40 PM

## 2015-11-18 NOTE — Progress Notes (Signed)
[facility] Therapy Treatment Record Phone [phone]   Radiation Therapy was administered to [place] on: 11/18/2015  3:03 PM and was treatment # 27 out of a planned course of 30 treatments.  Radiation Treatment  1). Beam photons with 6-10 energy  2). Brachytherapy None  3). Stereotactic Radiosurgery None  4). Other Radiation None     Hamlet Lasecki A Ilario Dhaliwal, RT (T)

## 2015-11-18 NOTE — Progress Notes (Signed)
CSW met with pt at bedside to assist with d/c planning. No family present. Pt reports that he will return home at d/c. " My son doesn't work. He is home with me all day and can take care of me."  Pt has declined SNF placement at this time. CSW will remain available in case pt changes his mond and agrees with SNF placement.  Werner Lean LCSW [phone]

## 2015-11-18 NOTE — Progress Notes (Signed)
RN called into room by pt and found pt feeding set on floor, Pt reports it became disconnected and he noted the set lying on floor and called out. Rn noted pt lying in pool of stomach content the color of tube feeding mixed with mucous and with an acidic odor.  When RN attempted to reconnect tube feeds set to PEG, RN felt pressure from inside PEG exerted against the set and the set popped off and disconnected again. Pt reports feeling full after initiation of tube feeds.  RN notified MD of this event and new orders were obtained.  Will obtain KUB and hold tube feeds at this time until KUB results are available per MD order.

## 2015-11-18 NOTE — Progress Notes (Addendum)
Nutrition Follow-up  DOCUMENTATION CODES:   Severe malnutrition in context of chronic illness  INTERVENTION:  -Change to Vital 1.5 @ 80mL/hr, increase by 10 every 24 hours to goal rate of 15mL/hr -145mL free water every 4 hours -At goal, provides 2160 calories, 97g protein, 2180cc free water   NUTRITION DIAGNOSIS:   Malnutrition related to chronic illness as evidenced by energy intake < 75% for > or equal to 1 month, severe depletion of muscle mass, severe depletion of body fat, percent weight loss. -ongoing  GOAL:   Patient will meet greater than or equal to 90% of their needs -Progressing  MONITOR:   PO intake, Labs, TF tolerance, I & O's, Skin, Weight trends  ASSESSMENT:   [name] is a 82 y.o. gentleman with a history of esophageal cancer (diagnosed earlier this year, not a surgical candidate, active chemotherapy and radiation treatments), CAD S/P CABG, combined systolic and diastolic heart failure (EF 35-40% and grade I diastolic dysfunction on last echo), PVD, and prior DVT who was referred to the ED from oncology clinic for evaluation of weakness and adult failure to thrive  Feels tightness, fullness in upper chest today. Some vomiting last night when given medications, refused medications today. Currently receiving Osmolite 1.5 @ 40 but not tolerating isotonic formula. Will switch to Vital 1.5 -> hydrolyzed, peptide based formula with MCTs for lipids should be better tolerated. No trays this morning XRT today.  Labs and Medications reviewed: Dronabinol, Reglan, Folic Acid, Senokot-S PRN K, CBGs WNL; Mg 1.3  Diet Order:  Diet clear liquid Room service appropriate? Yes; Fluid consistency: Thin  Skin:  Reviewed, no issues  Last BM:  8/18  Height:   Ht Readings from Last 1 Encounters:  11/14/15 5\' 5"  (1.651 m)    Weight:   Wt Readings from Last 1 Encounters:  11/17/15 148 lb 2.4 oz (67.2 kg)    Ideal Body Weight:  56.81 kg  BMI:  Body mass index is  24.65 kg/m.  Estimated Nutritional Needs:   Kcal:  [id number]  Protein:  83-95 grams  Fluid:  >/= 1.9L  EDUCATION NEEDS:   No education needs identified at this time  Roberto Anis. Kaj Vasil, MS, RD LDN Inpatient Clinical Dietitian Pager [phone]

## 2015-11-18 NOTE — Care Management Note (Signed)
Case Management Note  Patient Details  Name: Hovsep Sine MRN: [id number] Date of Birth: [birth year]  Subjective/Objective:      82 yo admitted with Dehydration.              Action/Plan: Pt from home with son. He has home 02 currently from [facility]. Pt was doing bolus TF prior to coming into the hospital but he states that [facility] did deliver pump and supplies for continuous feeds prior to him coming into the hospital. Per CSW pt declined SNF, and when I spoke with him he declined [facility] services as well.  He states with his son home with him he is able to be pretty independent.  This CM alerted MD that new continuous tube feeding script would be needed and [facility] DME rep alerted of need for continuous feeds. CM will continue to follow.  Expected Discharge Date:   (unknown)               Expected Discharge Plan:  Home/Self Care  In-House Referral:  Clinical Social Work  Discharge planning Services  CM Consult  Post Acute Care Choice:    Choice offered to:     DME Arranged:    DME Agency:     HH Arranged:    HH Agency:     Status of Service:  In process, will continue to follow  If discussed at Long Length of Stay Meetings, dates discussed:    Additional CommentsLynnell Catalan, RN 11/18/2015, 10:55 AM  [phone]

## 2015-11-19 ENCOUNTER — Other Ambulatory Visit: Payer: Self-pay | Admitting: Pharmacist

## 2015-11-19 ENCOUNTER — Ambulatory Visit: Payer: Medicare Other

## 2015-11-19 ENCOUNTER — Ambulatory Visit
Admission: RE | Admit: 2015-11-19 | Discharge: 2015-11-19 | Disposition: A | Discharge status: Home / Self Care | Payer: Medicare Other | Source: Ambulatory Visit | Attending: Radiation Oncology | Admitting: Radiation Oncology

## 2015-11-19 LAB — BASIC METABOLIC PANEL
ANION GAP: 4 — AB (ref 5–15)
BUN: 11 mg/dL (ref 6–20)
CALCIUM: 7.9 mg/dL — AB (ref 8.9–10.3)
CO2: 30 mmol/L (ref 22–32)
CREATININE: 0.71 mg/dL (ref 0.61–1.24)
Chloride: 98 mmol/L — ABNORMAL LOW (ref 101–111)
GFR calc Af Amer: >60 mL/min (ref >60)
GLUCOSE: 87 mg/dL (ref 65–99)
Potassium: 3.9 mmol/L (ref 3.5–5.1)
Sodium: 132 mmol/L — ABNORMAL LOW (ref 135–145)

## 2015-11-19 LAB — GLUCOSE, CAPILLARY
GLUCOSE-CAPILLARY: 96 mg/dL (ref 65–99)
GLUCOSE-CAPILLARY: 87 mg/dL (ref 65–99)
GLUCOSE-CAPILLARY: 73 mg/dL (ref 65–99)
GLUCOSE-CAPILLARY: 98 mg/dL (ref 65–99)
Glucose-Capillary: 84 mg/dL (ref 65–99)
Glucose-Capillary: 82 mg/dL (ref 65–99)

## 2015-11-19 LAB — CBC
HCT: 27 % — ABNORMAL LOW (ref 39.0–52.0)
HEMOGLOBIN: 9.3 g/dL — AB (ref 13.0–17.0)
MCH: 28.5 pg (ref 26.0–34.0)
MCHC: 34.4 g/dL (ref 30.0–36.0)
MCV: 82.8 fL (ref 78.0–100.0)
PLATELETS: 130 10*3/uL — AB (ref 150–400)
RBC: 3.26 MIL/uL — ABNORMAL LOW (ref 4.22–5.81)
RDW: 13.4 % (ref 11.5–15.5)
WBC: 3.5 10*3/uL — ABNORMAL LOW (ref 4.0–10.5)

## 2015-11-19 LAB — MAGNESIUM: MAGNESIUM: 1.6 mg/dL — AB (ref 1.7–2.4)

## 2015-11-19 MED ORDER — VITAL 1.5 CAL PO LIQD
1000.0000 mL | ORAL | Status: DC
  Administered 2015-11-19: 1000 mL
  Filled 2015-11-19 (×2): qty 1000

## 2015-11-19 MED ORDER — SENNOSIDES-DOCUSATE SODIUM 8.6-50 MG PO TABS
1.0000 | ORAL_TABLET | Freq: Two times a day (BID) | ORAL | Status: DC
  Administered 2015-11-21: 1 via ORAL
  Filled 2015-11-19 (×2): qty 1

## 2015-11-19 MED ORDER — METOCLOPRAMIDE HCL 5 MG/ML IJ SOLN
5.0000 mg | Freq: Four times a day (QID) | INTRAMUSCULAR | Status: DC
  Administered 2015-11-19 – 2015-11-21 (×9): 5 mg via INTRAVENOUS
  Filled 2015-11-19 (×9): qty 2

## 2015-11-19 MED ORDER — MAGNESIUM SULFATE 2 GM/50ML IV SOLN
2.0000 g | Freq: Once | INTRAVENOUS | Status: AC
  Administered 2015-11-19: 2 g via INTRAVENOUS
  Filled 2015-11-19: qty 50

## 2015-11-19 NOTE — Progress Notes (Signed)
Physical Therapy Treatment Patient Details Name: [name] MRN: [id number] DOB: 1931-05-15 Today's Date: 11-26-15    History of Present Illness This 82 year old man was admitted for fatique, weight loss and dehydration.  He has esophageal CA which was dx'd this year and is undergoing concurrent chemo and XRT.  He has had intermittent problems with feeding tube leaking and lightheadedness. PMH:  CABG, CAD, cardiomyopathy, PVD, emphysema, and he uses 3 liters of 02 at baseline    PT Comments    Pt encouraged to mobilize to assist with maintaining strength and mobility since pt prefers to d/c home with son.  Pt only agreeable to stand (performed twice).  Follow Up Recommendations  SNF (pt plans to return home with son assisting)     Equipment Recommendations  None recommended by PT    Recommendations for Other Services       Precautions / Restrictions Precautions Precautions: Fall Precaution Comments: chronic O2    Mobility  Bed Mobility Overal bed mobility: Modified Independent             General bed mobility comments: extra time and effort, presented with some SOB with limited activity.   Transfers Overall transfer level: Needs assistance Equipment used: Rolling walker (2 wheeled) Transfers: Sit to/from Stand Sit to Stand: Min guard         General transfer comment: pt required increased encouragement to at least stand (he states fearful of falling/weakness) however no physical assist required, min/guard for safety, performed twice, pt declined any further activity  Ambulation/Gait                 Stairs            Wheelchair Mobility    Modified Rankin (Stroke Patients Only)       Balance                                    Cognition Arousal/Alertness: Awake/alert Behavior During Therapy: WFL for tasks assessed/performed Overall Cognitive Status: Within Functional Limits for tasks assessed                       Exercises      General Comments        Pertinent Vitals/Pain Pain Assessment: No/denies pain    Home Living                      Prior Function            PT Goals (current goals can now be found in the care plan section) Progress towards PT goals: Progressing toward goals    Frequency  Min 3X/week    PT Plan Current plan remains appropriate    Co-evaluation             End of Session Equipment Utilized During Treatment: Oxygen Activity Tolerance: Patient tolerated treatment well Patient left: in bed;with call bell/phone within reach;with bed alarm set     Time: [id number] PT Time Calculation (min) (ACUTE ONLY): 25 min  Charges:  $Therapeutic Activity: 8-22 mins                    G Codes:      Deklyn Gibbon,KATHrine E November 26, 2015, 12:33 PM Carmelia Bake, PT, DPT 11-26-15  Pager: [phone]

## 2015-11-19 NOTE — Clinical Social Work Placement (Signed)
   CLINICAL SOCIAL WORK PLACEMENT  NOTE  Date:  11/19/2015  Patient Details  Name: [name] MRN: [id number] Date of Birth: 08-03-1931  Clinical Social Work is seeking post-discharge placement for this patient at the [place] level of care (*CSW will initial, date and re-position this form in  chart as items are completed):  Yes   Patient/family provided with [place] Work Department's list of facilities offering this level of care within the geographic area requested by the patient (or if unable, by the patient's family).  Yes   Patient/family informed of their freedom to choose among providers that offer the needed level of care, that participate in Medicare, Medicaid or managed care program needed by the patient, have an available bed and are willing to accept the patient.  Yes   Patient/family informed of [facility]'s ownership interest in [facility] and [facility], as well as of the fact that they are under no obligation to receive care at these facilities.  PASRR submitted to EDS on       PASRR number received on       Existing PASRR number confirmed on 11/19/15     FL2 transmitted to all facilities in geographic area requested by pt/family on 11/19/15     FL2 transmitted to all facilities within larger geographic area on       Patient informed that his/her managed care company has contracts with or will negotiate with certain facilities, including the following:            Patient/family informed of bed offers received.  Patient chooses bed at       Physician recommends and patient chooses bed at      Patient to be transferred to   on  .  Patient to be transferred to facility by       Patient family notified on   of transfer.  Name of family member notified:        PHYSICIAN       Additional Comment:    _______________________________________________ Luretha Rued, [place] 11/19/2015, 4:08 PM

## 2015-11-19 NOTE — Progress Notes (Signed)
Nutrition Follow-up  DOCUMENTATION CODES:   Severe malnutrition in context of chronic illness  INTERVENTION:   Monitor magnesium, potassium, and phosphorus daily for at least 3 days, MD to replete as needed, as pt is at risk for refeeding syndrome.  Initiate Vital 1.5 @ 10 ml/hr.   Continue free water flushes of 180 ml every 4 hours This provides 360 kcal, 16 g protein and 1263 ml H2O.  Tube feeding goal rate: Vital 1.5 @ 60 ml/hr Free water flushes of 180 ml every 4 hours This regimen provides 2160 kcal, 97g protein and 2180 ml H2O.  RD to continue to monitor for tolerance and ability to advance.  NUTRITION DIAGNOSIS:   Malnutrition related to chronic illness as evidenced by energy intake < 75% for > or equal to 1 month, severe depletion of muscle mass, severe depletion of body fat, percent weight loss.  Ongoing.  GOAL:   Patient will meet greater than or equal to 90% of their needs  Not meeting.  MONITOR:   PO intake, Labs, TF tolerance, I & O's, Skin, Weight trends   ASSESSMENT:   [name] is a 82 y.o. gentleman with a history of esophageal cancer (diagnosed earlier this year, not a surgical candidate, active chemotherapy and radiation treatments), CAD S/P CABG, combined systolic and diastolic heart failure (EF 35-40% and grade I diastolic dysfunction on last echo), PVD, and prior DVT who was referred to the ED from oncology clinic for evaluation of weakness and adult failure to thrive  Patient discussed during interdisciplinary rounds, pt has not been tolerating tube feedings of Vital 1.5 @ 40 ml/hr. Pt had difficulty tolerating a 30 ml bolus of water this morning.  Spoke with MD who wants to restart tube feeding, recommended starting at lowest rate (10 ml/hr) and slowly increasing towards goal. MD has ordered IV Reglan to aid in gastric emptying and N/V.  Per discussion with [place] RD pt was prescribed Zofran to take on a schedule PTA. Zofran ordered  PRN. Will monitor for tolerance and will advance accordingly. Pt still at risk of refeeding syndrome.  Medications: Marinol capsule BID, Folic acid tablet daily, IV Reglan every 6 hours, Senokot tablet BID, IV Zofran PRN Labs reviewed: Low Na, Mg  Diet Order:  Diet clear liquid Room service appropriate? Yes; Fluid consistency: Thin  Skin:  Reviewed, no issues  Last BM:  8/19  Height:   Ht Readings from Last 1 Encounters:  11/14/15 5\' 5"  (1.651 m)    Weight:   Wt Readings from Last 1 Encounters:  11/19/15 148 lb 4.8 oz (67.3 kg)    Ideal Body Weight:  56.81 kg  BMI:  Body mass index is 24.68 kg/m.  Estimated Nutritional Needs:   Kcal:  [id number]  Protein:  83-95 grams  Fluid:  >/= 1.9L  EDUCATION NEEDS:   No education needs identified at this time  Clayton Bibles, MS, RD, LDN Pager: [phone] After Hours Pager: [phone]

## 2015-11-19 NOTE — Progress Notes (Signed)
KUB results (normal) were reported to on call physician.  Patient reported he would rather hold off on tube feeding overnight anyway.  On call provider said to hold overnight, will reassess in am.  He also refused all oral meds, flushes, and meds via tube.  This am he had a residual of 40cc.  Patient allowed me to flush tube this am w/ 30cc's water.  Immediately after flushing, patient developed N/V.[name]

## 2015-11-19 NOTE — Progress Notes (Signed)
PROGRESS NOTE    [name]  [id number] DOB: November 19, 1931 DOA: 11/14/2015 PCP: [name], MD   Brief Narrative: [name] is a 82 y.o. gentleman with a history of esophageal cancer (diagnosed earlier this year, not a surgical candidate, active chemotherapy and radiation treatments), CAD S/P CABG, combined systolic and diastolic heart failure (EF 35-40% and grade I diastolic dysfunction on last echo), PVD, and prior DVT who was referred to the ED from oncology clinic for evaluation of weakness and adult failure to thrive.  He is S/P four cycles of carboplatin and taxol; fifth cycle has been deferred due to his deconditioned stated.  He denies fever.  He has had a chronic cough productive of clear sputum/white sputum which is unchanged.  No hemoptysis.  He denies chest pain, pressure, or tightness.  He reports intermittent heart burn.  He has chronic shortness of breath which he relates to a prior lung surgery for a benign lung mass.  He wears 3L [place] at baseline which is chronic and unchanged.  He has had intermittent light-headedness but no syncope or LOC.  No abdominal pain.  Stools have been loose since starting tube feedings.  No dysuria.  He denies blood in his urine or stool.  Of note, he has a gastrostomy tube for primary nutrition (he takes very little by mouth anymore), and he has had recurrent problems with leaking and tube malfunction.  He also reports that he has had difficulty tolerating the recommended amount of daily tube feedings to meet his nutritional needs (should be on Osmolite 1.5, 6 cans daily with 120cc of free water with each can).  He has not been getting the recommended amount of free water either.  Admitted with failure to Thrive, dehydration. He has not been able to tolerates tube feeding. Formula was change, will try low rate. Home when tolerates tube feeding.    Assessment & Plan:   Active Problems:   Chronic respiratory failure with hypoxia (HCC)    Dehydration   Severe protein-calorie malnutrition (HCC)   PEG tube malfunction (HCC)   Adult failure to thrive   Antineoplastic chemotherapy induced pancytopenia (HCC)   Hypomagnesemia   CHF (congestive heart failure) (HCC)   CAD (coronary artery disease)   Acid reflux   Esophageal cancer patient, actively undergoing chemotherapy and radiation treatments, who presents with pancytopenia and dehydration.  Admits that intake through his PEG has been inadequate due to recurrent problems with the functioning of the tube. --Somewhat improved after IV fluids.  Will continue hydration cautiously overnight due to history of CHF. So far, he appears compensated. --PT eval and treat --Pepcid BID for reflux symptoms --Evaluated by oncology, no Neupogen indicated unless spike fever.  --received radiation 8-21, needs to 2 treatment to finished cycle.   Nutrition; G tube.  G tube, evaluated by IR 8-18, working. [name]  He has not been able to tolerates tube feeding. yesterday formula was change to Vital.  Last night 8-21 develops abdominal distension. Tube feeding stopped. KUB was negative for obstruction.  I will change Reglan from oral to IV.  Discussed with nutritionist will try low rate.  I have ordered schedule laxatives.   Hyponatremia;continue with IV fluids, he is not tolerating tube feeding.   FTT;  Secondary to malignancy, poor oral intake.  TSH 3.8. He is not tolerating tube feedings.    Pancytopenia;  No Neupogen unless spike fever. Improving.   Diarrhea; monitor. Resolved.   Hypomagnesemia (borderline) --IV replacement ordered.  --Replete IV.  CAD --Continue aspirin, plavix, BB, statin for now.   --Agree with holding ARB due to renal insufficiency.  Combined CHF, compensated at this point --BNP at 175.  -monitor on IV fluids.   DVT prophylaxis: SCD Code Status: Full code.  Family Communication: care disc Disposition Plan: decline SNF   Consultants:   Dr Lindi Adie ,  oncology   Dr Lisbeth Renshaw    Procedures:   none   Antimicrobials:   none   Subjective: Didn't tolerates tube feeding yesterday.  No BM yesterday.      Objective: Vitals:   11/18/15 1232 11/19/15 0116 11/19/15 0500 11/19/15 0543  BP: (!) 108/57 100/62  (!) 114/50  Pulse: 97 97  89  Resp: 18 18  18   Temp: 99.1 F (37.3 C) 98.4 F (36.9 C)  97.9 F (36.6 C)  TempSrc: Oral Oral  Oral  SpO2: 100% 100%  100%  Weight:   67.3 kg (148 lb 4.8 oz)   Height:        Intake/Output Summary (Last 24 hours) at 11/19/15 0926 Last data filed at 11/19/15 0732  Gross per 24 hour  Intake          1139.25 ml  Output              705 ml  Net           434.25 ml   Filed Weights   11/16/15 0630 11/17/15 1500 11/19/15 0500  Weight: 65.5 kg (144 lb 6.4 oz) 67.2 kg (148 lb 2.4 oz) 67.3 kg (148 lb 4.8 oz)    Examination:  General exam: Appears calm and comfortable  Respiratory system: Clear to auscultation. Respiratory effort normal. Cardiovascular system: S1 & S2 heard, RRR. No JVD, murmurs, rubs, gallops or clicks. No pedal edema. Gastrointestinal system: Abdomen is nondistended, soft and nontender. No organomegaly or masses felt. Normal bowel sounds heard. Central nervous system: Alert and oriented. No focal neurological deficits. Extremities: Symmetric 5 x 5 power. Skin: No rashes, lesions or ulcers Psychiatry: Judgement and insight appear normal. Mood & affect appropriate.     Data Reviewed: I have personally reviewed following labs and imaging studies  CBC:  Recent Labs Lab 11/14/15 1658 11/15/15 0537 11/16/15 0540 11/17/15 0500 11/18/15 0500 11/19/15 0500  WBC 1.5* 1.0* 1.0* 2.0* 3.3* 3.5*  NEUTROABS 1.1* 0.7* 0.6* 1.4* 2.5  --   HGB 12.1* 8.8* 9.0* 9.1* 9.4* 9.3*  HCT 35.9* 26.2* 26.7* 27.1* 27.4* 27.0*  MCV 85.1 83.7 83.2 84.4 83.0 82.8  PLT 130* 110* 113* 127* 133* [id number]   Basic Metabolic Panel:  Recent Labs Lab 11/13/15 1005  11/15/15 0537 11/16/15 0540  11/17/15 0500 11/18/15 0500 11/19/15 0500  NA 141  < > 143 142 137 133* 132*  K 4.5  < > 3.8 3.3* 3.8 3.6 3.9  CL  --   < > 113* 110 106 99* 98*  CO2 26  < > 25 27 28 29 30   GLUCOSE 129  < > 80 114* 126* 128* 87  BUN 37.7*  < > 38* 26* 16 14 11   CREATININE 1.3  < > 1.22 0.83 0.77 0.67 0.71  CALCIUM 9.4  < > 7.9* 8.1* 7.9* 7.9* 7.9*  MG 1.6  --   --  1.3* 1.4* 1.3* 1.6*  < > = values in this interval not displayed. GFR: Estimated Creatinine Clearance: 61.9 mL/min (by C-G formula based on SCr of 0.8 mg/dL). Liver Function Tests:  Recent Labs Lab 11/13/15 1005 11/14/15 1658  AST 14 19  ALT 12 15*  ALKPHOS 50 43  BILITOT 0.86 1.0  PROT 6.3* 6.4*  ALBUMIN 3.1* 3.6    Recent Labs Lab 11/14/15 1703  LIPASE 30   No results for input(s): AMMONIA in the last 168 hours. Coagulation Profile: No results for input(s): INR, PROTIME in the last 168 hours. Cardiac Enzymes: No results for input(s): CKTOTAL, CKMB, CKMBINDEX, TROPONINI in the last 168 hours. BNP (last 3 results) No results for input(s): PROBNP in the last 8760 hours. HbA1C: No results for input(s): HGBA1C in the last 72 hours. CBG:  Recent Labs Lab 11/18/15 0039 11/18/15 0839 11/18/15 1135 11/18/15 1721 11/18/15 2232  GLUCAP 108* 115* 139* 101* 92   Lipid Profile: No results for input(s): CHOL, HDL, LDLCALC, TRIG, CHOLHDL, LDLDIRECT in the last 72 hours. Thyroid Function Tests:  Recent Labs  11/18/15 1725  TSH 3.885   Anemia Panel: No results for input(s): VITAMINB12, FOLATE, FERRITIN, TIBC, IRON, RETICCTPCT in the last 72 hours. Sepsis Labs:  Recent Labs Lab 11/14/15 1703 11/15/15 0106  LATICACIDVEN 2.1* 0.9    No results found for this or any previous visit (from the past 240 hour(s)).       Radiology Studies: Dg Abd 1 View  Result Date: 11/18/2015 CLINICAL DATA:  PEG tube possibly dislodged EXAM: ABDOMEN - 1 VIEW COMPARISON:  11/14/2015 FINDINGS: Gastrostomy tube overlies the gastric  bubble in the left upper abdomen, unchanged. Nonobstructive bowel gas pattern. Mild degenerative changes of the lumbar spine. IMPRESSION: Gastrostomy tube overlies the gastric bubble in the left upper abdomen, unchanged. Electronically Signed   By: Julian Hy M.D.   On: 11/18/2015 19:43        Scheduled Meds: . aspirin  81 mg Oral Daily  . atorvastatin  40 mg Oral Daily  . clopidogrel  75 mg Oral Daily  . dronabinol  2.5 mg Oral BID AC  . folic acid  1 mg Oral Daily  . free water  180 mL Per Tube Q4H  . metoCLOPramide  10 mg Oral TID AC  . metoprolol tartrate  25 mg Oral Daily  . ranitidine  150 mg Per Tube BID   Continuous Infusions: . sodium chloride 75 mL/hr at 11/19/15 0550  . feeding supplement (VITAL 1.5 CAL) Stopped (11/18/15 1800)     LOS: 1 day    Time spent: 35 minutes.     Elmarie Shiley, MD [facility] Pager [phone]  If 7PM-7AM, please contact night-coverage [email or web address] Password [facility] 11/19/2015, 9:26 AM

## 2015-11-19 NOTE — NC FL2 (Addendum)
[place] LEVEL OF CARE SCREENING TOOL     IDENTIFICATION  Patient Name: [name] Birthdate: Feb 28, 1932 Sex: male Admission Date (Current Location): 11/14/2015  [facility] and [place] Number:  Advertising copywriter and Address:  [facility],  [place] [address] [place]      Provider Number: [id number]  Attending Physician Name and Address:  Elmarie Shiley, MD  Relative Name and Phone Number:       Current Level of Care: Hospital Recommended Level of Care: [place] Prior Approval Number:    Date Approved/Denied:   PASRR Number:   [id number]  Discharge Plan: SNF    Current Diagnoses: Patient Active Problem List   Diagnosis Date Noted  . Nausea with vomiting 11/14/2015  . PEG tube malfunction ([place]) 11/14/2015  . Adult failure to thrive 11/14/2015  . Antineoplastic chemotherapy induced pancytopenia ([place]) 11/14/2015  . Hypomagnesemia 11/14/2015  . CHF (congestive heart failure) ([place]) 11/14/2015  . CAD (coronary artery disease) 11/14/2015  . Acid reflux 11/14/2015  . Severe protein-calorie malnutrition ([place]) 10/09/2015  . Unintentional weight loss 10/04/2015  . Dehydration 10/04/2015  . Carcinoma of lower third of esophagus ([place]) 09/23/2015  . Occlusion and stenosis of carotid artery without mention of cerebral infarction 09/07/2011  . Stricture of artery ([place]) 08/10/2011  . Chronic respiratory failure with hypoxia ([place]) 10/21/2007  . COPD  GOLD III  03/16/2007  . PULMONARY NODULE 03/16/2007    Orientation RESPIRATION BLADDER Height & Weight     Self, Time, Situation, Place  Normal Incontinent Weight: 148 lb 4.8 oz (67.3 kg) Height:  5\' 5"  (165.1 cm)  BEHAVIORAL SYMPTOMS/MOOD NEUROLOGICAL BOWEL NUTRITION STATUS  Other (Comment) (no behaviors)   Incontinent Feeding tube  AMBULATORY STATUS COMMUNICATION OF NEEDS Skin   Extensive Assist Verbally Normal                       Personal Care Assistance Level  of Assistance  Bathing, Feeding, Dressing Bathing Assistance: Maximum assistance Feeding assistance:  (feeding tube) Dressing Assistance: Maximum assistance     Functional Limitations Info  Sight, Hearing, Speech Sight Info: Impaired Hearing Info: Adequate Speech Info: Adequate    SPECIAL CARE FACTORS FREQUENCY  PT (By licensed PT), OT (By licensed OT)     PT Frequency: 5 x wk OT Frequency: 5 x wk            Contractures Contractures Info: Not present    Additional Factors Info  Code Status Code Status Info: Full Code             Current Medications (11/19/2015):  This is the current hospital active medication list Current Facility-Administered Medications  Medication Dose Route Frequency Provider Last Rate Last Dose  . 0.9 %  sodium chloride infusion   Intravenous Continuous Belkys A Regalado, MD 75 mL/hr at 11/19/15 0550    . acetaminophen (TYLENOL) tablet 650 mg  650 mg Oral Q6H PRN Lily Kocher, MD       Or  . acetaminophen (TYLENOL) suppository 650 mg  650 mg Rectal Q6H PRN Lily Kocher, MD      . albuterol (PROVENTIL) (2.5 MG/3ML) 0.083% nebulizer solution 2.5 mg  2.5 mg Nebulization QID PRN Lily Kocher, MD      . aspirin chewable tablet 81 mg  81 mg Oral Daily Lily Kocher, MD   81 mg at 11/18/15 [id number]  . atorvastatin (LIPITOR) tablet 40 mg  40 mg Oral Daily Lexine Baton  Eulas Post, MD   40 mg at 11/18/15 [id number]  . clopidogrel (PLAVIX) tablet 75 mg  75 mg Oral Daily Lily Kocher, MD   75 mg at 11/17/15 2119  . dronabinol (MARINOL) capsule 2.5 mg  2.5 mg Oral BID AC Lily Kocher, MD   2.5 mg at 11/18/15 0952  . feeding supplement (VITAL 1.5 CAL) liquid 1,000 mL  1,000 mL Per Tube Q24H Belkys A Regalado, MD      . folic acid (FOLVITE) tablet 1 mg  1 mg Oral Daily Lily Kocher, MD   1 mg at 11/18/15 0952  . free water 180 mL  180 mL Per Tube Q4H Belkys A Regalado, MD   30 mL at 11/19/15 0400  . metoCLOPramide (REGLAN) injection 5 mg  5 mg Intravenous Q6H Belkys A Regalado, MD    5 mg at 11/19/15 1117  . metoprolol tartrate (LOPRESSOR) tablet 25 mg  25 mg Oral Daily Lily Kocher, MD   25 mg at 11/18/15 [phone]  . ondansetron (ZOFRAN) tablet 4 mg  4 mg Oral Q6H PRN Lily Kocher, MD       Or  . ondansetron [facility]) injection 4 mg  4 mg Intravenous Q6H PRN Lily Kocher, MD   4 mg at 11/19/15 0550  . ranitidine (ZANTAC) 150 MG/10ML syrup 150 mg  150 mg Per Tube BID Lily Kocher, MD   150 mg at 11/18/15 0952  . senna-docusate (Senokot-S) tablet 1 tablet  1 tablet Oral BID Elmarie Shiley, MD         Discharge Medications: Please see discharge summary for a list of discharge medications.  Relevant Imaging Results:  Relevant Lab Results:   Additional Information ss # [id number]. Pt has upcomimg appts. at [facility] .  Ha[place], Randall An, LCSW

## 2015-11-19 NOTE — Progress Notes (Addendum)
Patient has refused all oral meds and flushes due to c/o every time he tries he gets really sick and nauseated. Nurse has tried on several attempts. Dr Tyrell Antonio made aware and asked nurse to explain to patient that he can have nausea medicine prior to oral  meds but patient states that he is not going to try at this time due to nausea associated with taking po meds and flushes.

## 2015-11-19 NOTE — Clinical Social Work Note (Signed)
Clinical Social Work Assessment  Patient Details  Name: [name] MRN: [id number] Date of Birth: 1932/03/07  Date of referral:  11/19/15               Reason for consult:  Facility Placement, Discharge Planning                Permission sought to share information with:  Chartered certified accountant granted to share information::  Yes, Verbal Permission Granted  Name::        Agency::     Relationship::     Contact Information:     Housing/Transportation Living arrangements for the past 2 months:  Single Family Home Source of Information:  Patient, Adult Children Patient Interpreter Needed:  None Criminal Activity/Legal Involvement Pertinent to Current Situation/Hospitalization:  No - Comment as needed Significant Relationships:  Adult Children Lives with:  Adult Children Do you feel safe going back to the place where you live?  No Need for family participation in patient care:  Yes (Comment)  Care giving concerns:  Son isn't able to provide pr with needed care t home following hospital d/c.   Social Worker assessment / plan:  Pt hospitalized from home on 11/14/15 with Chronic respiratory failure with hypoxia . CSW consulted to assist with d/c planning. PT has recommended SNF at d/c. Initially pt declined placement reporting his son would care for him at home. CSW met with pt / son at bedside this afternoon. Pt's son feels ST Rehab is needed prior to returning home. Pt has agreed with this plan and SNF search has been initiated. Bed offers are pending. CSW will continue to follow to assist with d/c planning to SNF.  Employment status:  Retired Nurse, adult PT Recommendations:  [place] / Referral to community resources:  [place]  Patient/Family's Response to care:  Pt would prefer to return home but is willing to try rehab.  Patient/Family's Understanding of and Emotional Response to  Diagnosis, Current Treatment, and Prognosis:  Pt / son are aware of pt's medical status. Pt reports, " My son thinks I need to go to rehab. I'll go to get stronger. Then I want to go home. " Pt encouraged to work with therapy. Support / reassurance provided. Emotional Assessment Appearance:  Appears stated age Attitude/Demeanor/Rapport:  Other (cooperative) Affect (typically observed):  Pleasant, Appropriate, Accepting Orientation:  Oriented to Self, Oriented to Place, Oriented to  Time, Oriented to Situation Alcohol / Substance use:  Not Applicable Psych involvement (Current and /or in the community):  No (Comment)  Discharge Needs  Concerns to be addressed:  Discharge Planning Concerns Readmission within the last 30 days:  No Current discharge risk:  None Barriers to Discharge:  No Barriers Identified   Loraine Maple  [phone] 11/19/2015, 3:51 PM

## 2015-11-19 NOTE — Progress Notes (Signed)
Occupational Therapy Treatment Patient Details Name: [name] MRN: [id number] DOB: 29-Aug-1931 Today's Date: 11/19/2015    History of present illness This 82 year old man was admitted for fatique, weight loss and dehydration.  He has esophageal CA which was dx'd this year and is undergoing concurrent chemo and XRT.  He has had intermittent problems with feeding tube leaking and lightheadedness. PMH:  CABG, CAD, cardiomyopathy, PVD, emphysema, and he uses 3 liters of 02 at baseline   OT comments  Pt initially with orthostatic BP supine to sit, but asymptomatic.  Rose after several minutes of sitting but did not get to supine level.  See vitals section of chart  Follow Up Recommendations  SNF;Supervision/Assistance - 24 hour    Equipment Recommendations       Recommendations for Other Services      Precautions / Restrictions Precautions Precautions: Fall Precaution Comments: chronic O2; check BP--orthostatic Restrictions Weight Bearing Restrictions: No       Mobility Bed Mobility Overal bed mobility: Needs Assistance Bed Mobility: Supine to Sit;Sit to Supine     Supine to sit: Min assist Sit to supine: Supervision   General bed mobility comments: Min A for trunk today when sitting up.  Supervision for lines when lying back down  Transfers by PT Overall transfer level: Needs assistance Equipment used: Rolling walker (2 wheeled) Transfers: Sit to/from Stand Sit to Stand: Min guard         General transfer comment: did not stand with OT    Balance                                   ADL       Grooming: Wash/dry face;Set up;Sitting                                 General ADL Comments: sat eob x 7 minutes for light grooming and level one theraband exercises      Vision                     Perception     Praxis      Cognition   Behavior During Therapy: WFL for tasks assessed/performed Overall Cognitive Status:  Within Functional Limits for tasks assessed                       Extremity/Trunk Assessment               Exercises Other Exercises Other Exercises: issued level one theraband and pt performed one set of 10, horizontal abduction from EOB. Educated on biceps and FF but pt did not perform today. Other Exercises: Pt orthostatic:  see vitals section of chart   Shoulder Instructions       General Comments      Pertinent Vitals/ Pain       Pain Assessment: No/denies pain  Home Living                                          Prior Functioning/Environment              Frequency Min 2X/week     Progress Toward Goals  OT Goals(current goals can now be found in the care  plan section)  Progress towards OT goals: Progressing toward goals     Plan      Co-evaluation                 End of Session     Activity Tolerance Patient limited by fatigue   Patient Left in bed;with call bell/phone within reach;with family/visitor present;with bed alarm set   Nurse Communication          Time: [id number] OT Time Calculation (min): 25 min  Charges: OT General Charges $OT Visit: 1 Procedure OT Treatments $Therapeutic Activity: 8-22 mins  [name] 11/19/2015, 3:19 PM  Lesle Chris, OTR/L [phone] 11/19/2015

## 2015-11-20 ENCOUNTER — Encounter: Payer: Medicare Other | Admitting: Nutrition

## 2015-11-20 ENCOUNTER — Ambulatory Visit: Payer: Medicare Other | Admitting: Radiation Oncology

## 2015-11-20 ENCOUNTER — Ambulatory Visit: Payer: Medicare Other

## 2015-11-20 ENCOUNTER — Ambulatory Visit
Admission: RE | Admit: 2015-11-20 | Discharge: 2015-11-20 | Disposition: A | Discharge status: Home / Self Care | Payer: Medicare Other | Source: Ambulatory Visit | Attending: Radiation Oncology | Admitting: Radiation Oncology

## 2015-11-20 ENCOUNTER — Inpatient Hospital Stay (HOSPITAL_COMMUNITY): Payer: Medicare Other

## 2015-11-20 ENCOUNTER — Ambulatory Visit
Admit: 2015-11-20 | Discharge: 2015-11-20 | Disposition: A | Discharge status: Home / Self Care | Payer: Medicare Other | Admitting: Radiation Oncology

## 2015-11-20 ENCOUNTER — Other Ambulatory Visit: Payer: Medicare Other

## 2015-11-20 ENCOUNTER — Other Ambulatory Visit: Payer: Self-pay | Admitting: Radiology

## 2015-11-20 ENCOUNTER — Ambulatory Visit: Payer: Medicare Other | Admitting: Hematology

## 2015-11-20 DIAGNOSIS — D61810 Antineoplastic chemotherapy induced pancytopenia: Secondary | ICD-10-CM

## 2015-11-20 DIAGNOSIS — J9611 Chronic respiratory failure with hypoxia: Secondary | ICD-10-CM

## 2015-11-20 DIAGNOSIS — K9423 Gastrostomy malfunction: Secondary | ICD-10-CM

## 2015-11-20 DIAGNOSIS — E43 Unspecified severe protein-calorie malnutrition: Secondary | ICD-10-CM

## 2015-11-20 DIAGNOSIS — E860 Dehydration: Principal | ICD-10-CM

## 2015-11-20 DIAGNOSIS — R627 Adult failure to thrive: Secondary | ICD-10-CM

## 2015-11-20 LAB — GLUCOSE, CAPILLARY
GLUCOSE-CAPILLARY: 88 mg/dL (ref 65–99)
Glucose-Capillary: 85 mg/dL (ref 65–99)
Glucose-Capillary: 86 mg/dL (ref 65–99)
Glucose-Capillary: 104 mg/dL — ABNORMAL HIGH (ref 65–99)

## 2015-11-20 LAB — MAGNESIUM: Magnesium: 1.5 mg/dL — ABNORMAL LOW (ref 1.7–2.4)

## 2015-11-20 MED ORDER — SODIUM CHLORIDE 0.9% FLUSH: 10.0000 mL | INTRAVENOUS | Status: DC | PRN

## 2015-11-20 MED ORDER — FREE WATER
1080.0000 mL | Status: DC
  Administered 2015-11-20: 1080 mL

## 2015-11-20 MED ORDER — MAGNESIUM SULFATE 2 GM/50ML IV SOLN
2.0000 g | Freq: Once | INTRAVENOUS | Status: AC
  Administered 2015-11-20: 2 g via INTRAVENOUS
  Filled 2015-11-20: qty 50

## 2015-11-20 MED ORDER — VITAL 1.5 CAL PO LIQD
1000.0000 mL | ORAL | Status: DC
  Filled 2015-11-20: qty 1000

## 2015-11-20 MED ORDER — SODIUM CHLORIDE 0.9% FLUSH
10.0000 mL | Freq: Two times a day (BID) | INTRAVENOUS | Status: DC
  Administered 2015-11-20: 10 mL

## 2015-11-20 MED ORDER — VITAL 1.5 CAL PO LIQD
1000.0000 mL | ORAL | Status: DC
  Administered 2015-11-20: 1000 mL
  Filled 2015-11-20 (×2): qty 1000

## 2015-11-20 NOTE — Progress Notes (Signed)
Order for MBS received, Spoke to MD and clarified order.  MBS scheduled for today at 1330 - RN and xray aware - note pt needs to go to XRT at 2 pm today per RN.  Thanks for this consult. Luanna Salk, [place] [facility] SLP [phone]

## 2015-11-20 NOTE — Progress Notes (Signed)
Patient was able to tolerate low continuous infusion of tube feeding last night.  Patient is still refusing taking medications via tube and PO medications despite education and encouragement.  Will continue to monitor.Azzie Glatter [place]

## 2015-11-20 NOTE — Progress Notes (Signed)
[facility] Therapy Treatment Record Phone [phone]   Radiation Therapy was administered to [name] on: 11/20/2015  4:36 PM and was treatment #29 out of a planned course of 30 treatments.  Radiation Treatment  1). Beam photons with 6-10 energy  2). Brachytherapy None  3). Stereotactic Radiosurgery None  4). Other Radiation None     [name] A Korrie Hofbauer, RT (T)

## 2015-11-20 NOTE — Progress Notes (Signed)
CSW assisting with d/c planning. Pt has chosen [facility] for SNF placement. Bed is available today if stable for d/c. Pt's son has been updated. CSW will follow to asist with d/c planning to SNF.  Werner Lean LCSW [phone]

## 2015-11-20 NOTE — Progress Notes (Signed)
Physical Therapy Treatment Patient Details Name: [name] MRN: [id number] DOB: [birth year] Today's Date: 11/20/2015    History of Present Illness This 82 year old man was admitted for fatique, weight loss and dehydration.  He has esophageal CA which was dx'd this year and is undergoing concurrent chemo and XRT.  He has had intermittent problems with feeding tube leaking and lightheadedness. PMH:  CABG, CAD, cardiomyopathy, PVD, emphysema, and he uses 3 liters of 02 at baseline    PT Comments    Pt in bed on 3 lts nasal required MAX encouragement to participate as he feels "bad", "weak" and "not up for much". Assisted OOB and amb a limited distance due to fatigue.  Follow Up Recommendations  SNF     Equipment Recommendations  None recommended by PT    Recommendations for Other Services       Precautions / Restrictions Precautions Precaution Comments: chronic O2 3 lts at home Restrictions Weight Bearing Restrictions: No    Mobility  Bed Mobility Overal bed mobility: Needs Assistance       Supine to sit: Min guard     General bed mobility comments: Max encouragement then [employer] Assist to transfer to EOB.  Pt able to to self perform with increased time and needed assist with covers removal.    Transfers Overall transfer level: Needs assistance Equipment used: Bilateral platform walker Transfers: Sit to/from Stand Sit to Stand: Min assist;+2 physical assistance;+2 safety/equipment         General transfer comment: 25% VC's on proper hand placement and increased time   Ambulation/Gait Ambulation/Gait assistance: Min assist;+2 physical assistance;+2 safety/equipment Ambulation Distance (Feet): 5 Feet Assistive device: Bilateral platform walker (EVA walker) Gait Pattern/deviations: Step-to pattern;Step-through pattern;Decreased step length - right;Decreased step length - left;Shuffle;Narrow base of support Gait velocity: decreased   General Gait Details: used B  Platform for increased support.  Pt c/o MAX weakness.  Recliner following closely behind.  Limited activity tolerance.  Remained on 3 lts nasal sats avg 98% however RR increased to 32.     Stairs            Wheelchair Mobility    Modified Rankin (Stroke Patients Only)       Balance                                    Cognition Arousal/Alertness: Awake/alert Behavior During Therapy: WFL for tasks assessed/performed Overall Cognitive Status: Within Functional Limits for tasks assessed                      Exercises      General Comments        Pertinent Vitals/Pain Pain Assessment: No/denies pain    Home Living                      Prior Function            PT Goals (current goals can now be found in the care plan section) Progress towards PT goals: Progressing toward goals    Frequency  Min 3X/week    PT Plan Current plan remains appropriate    Co-evaluation             End of Session Equipment Utilized During Treatment: Oxygen Activity Tolerance: Treatment limited secondary to medical complications (Comment) Patient left: in chair;with call bell/phone within reach  Time: [id number] PT Time Calculation (min) (ACUTE ONLY): 28 min  Charges:  $Gait Training: 8-22 mins $Therapeutic Activity: 8-22 mins                    G Codes:      Rica Koyanagi  PTA [facility]  Acute  Rehab Pager      [phone]

## 2015-11-20 NOTE — Progress Notes (Signed)
PROGRESS NOTE    [name]  [id number] DOB: 03/05/1932 DOA: 11/14/2015 PCP: [name], MD   Brief Narrative: [name] is a 82 y.o. gentleman with a history of esophageal cancer (diagnosed earlier this year, not a surgical candidate, active chemotherapy and radiation treatments), CAD S/P CABG, combined systolic and diastolic heart failure (EF 35-40% and grade I diastolic dysfunction on last echo), PVD, and prior DVT who was referred to the ED from oncology clinic for evaluation of weakness and adult failure to thrive.  He is S/P four cycles of carboplatin and taxol; fifth cycle has been deferred due to his deconditioned stated.  He denies fever.  He has had a chronic cough productive of clear sputum/white sputum which is unchanged.  No hemoptysis.  He denies chest pain, pressure, or tightness.  He reports intermittent heart burn.  He has chronic shortness of breath which he relates to a prior lung surgery for a benign lung mass.  He wears 3L [place] at baseline which is chronic and unchanged.  He has had intermittent light-headedness but no syncope or LOC.  No abdominal pain.  Stools have been loose since starting tube feedings.  No dysuria.  He denies blood in his urine or stool.  Of note, he has a gastrostomy tube for primary nutrition (he takes very little by mouth anymore), and he has had recurrent problems with leaking and tube malfunction.  He also reports that he has had difficulty tolerating the recommended amount of daily tube feedings to meet his nutritional needs (should be on Osmolite 1.5, 6 cans daily with 120cc of free water with each can).  He has not been getting the recommended amount of free water either.  Admitted with failure to thrive & dehydration.    Subjective: Tells me that when he tries to swallow, he regurgitates his food. No other complaints. Today.    Assessment & Plan:   Esophageal cancer- stage 3 -  actively undergoing chemotherapy and radiation treatments,  who presents with pancytopenia and dehydration.   - last chemo with Taxol and Carboplatin earlier this month- 5th cycle on hold due to FTT - continue radiation treatments  Nutrition; G tube  -G tube, evaluated by IR 8-18, working properly  -He has not been able to tolerates tube feeding and therefore formula was change to Vital.  -Reglan added by his oncologist to help with motility issues - currently on IV Reglan -  tube feeds resumed- will increase rate slowly which giving free water via pump rather than as boluses.  FTT/ severe protein calorie malnutrition - significant amount of weight loss followed by inability to ambulate for about 3 wks now   Hyponatremia - continue with IV fluids, he is not tolerating tube feeding.   COPD on 3 L O2 at baseline  - stable  Pancytopenia - improving  - No Neupogen unless spike fever  Diarrhea - monitor. Resolved.   Hypomagnesemia (borderline) - IV replacement ordered.  - Replete IV.   CAD s/p CABG - Continue aspirin, plavix, BB, statin for now.   - Agree with holding ARB due to renal insufficiency.  Combined CHF, compensated at this point --BNP at 175.  -monitor on IV fluids.   PVD with stents in lower extermities - ASA and Plavix  DVT prophylaxis: SCD Code Status: Full code.  Family Communication: care disc Disposition Plan: decline SNF   Consultants:   Dr Lindi Adie , oncology   Dr Lisbeth Renshaw    Procedures:   none  Antimicrobials:   none   Objective: Vitals:   11/19/15 0543 11/19/15 1307 11/19/15 2030 11/20/15 0430  BP: (!) 114/50 101/60 (!) 117/58 (!) 115/56  Pulse: 89 84 98 (!) 108  Resp: 18 15 16 20   Temp: 97.9 F (36.6 C) 97.1 F (36.2 C) 98.3 F (36.8 C) 98.4 F (36.9 C)  TempSrc: Oral Oral Oral Oral  SpO2: 100% 100% 100% 99%  Weight:    66.3 kg (146 lb 3.2 oz)  Height:        Intake/Output Summary (Last 24 hours) at 11/20/15 1343 Last data filed at 11/20/15 0631  Gross per 24 hour  Intake           1839.08 ml  Output              750 ml  Net          1089.08 ml   Filed Weights   11/17/15 1500 11/19/15 0500 11/20/15 0430  Weight: 67.2 kg (148 lb 2.4 oz) 67.3 kg (148 lb 4.8 oz) 66.3 kg (146 lb 3.2 oz)    Examination:  General exam: Appears calm and comfortable  Respiratory system: Clear to auscultation. Respiratory effort normal. Cardiovascular system: S1 & S2 heard, RRR. No JVD, murmurs, rubs, gallops or clicks. No pedal edema. Gastrointestinal system: Abdomen is nondistended, soft and nontender. No organomegaly or masses felt. Normal bowel sounds heard.- PEG tube intact Central nervous system: Alert and oriented. No focal neurological deficits. Extremities: Symmetric 5 x 5 power. Skin: No rashes, lesions or ulcers Psychiatry: Judgement and insight appear normal. Mood & affect appropriate.     Data Reviewed: I have personally reviewed following labs and imaging studies  CBC:  Recent Labs Lab 11/14/15 1658 11/15/15 0537 11/16/15 0540 11/17/15 0500 11/18/15 0500 11/19/15 0500  WBC 1.5* 1.0* 1.0* 2.0* 3.3* 3.5*  NEUTROABS 1.1* 0.7* 0.6* 1.4* 2.5  --   HGB 12.1* 8.8* 9.0* 9.1* 9.4* 9.3*  HCT 35.9* 26.2* 26.7* 27.1* 27.4* 27.0*  MCV 85.1 83.7 83.2 84.4 83.0 82.8  PLT 130* 110* 113* 127* 133* [id number]*   Basic Metabolic Panel:  Recent Labs Lab 11/15/15 0537 11/16/15 0540 11/17/15 0500 11/18/15 0500 11/19/15 0500 11/20/15 0500  NA 143 142 137 133* 132*  --   K 3.8 3.3* 3.8 3.6 3.9  --   CL 113* 110 106 99* 98*  --   CO2 25 27 28 29 30   --   GLUCOSE 80 114* 126* 128* 87  --   BUN 38* 26* 16 14 11   --   CREATININE 1.22 0.83 0.77 0.67 0.71  --   CALCIUM 7.9* 8.1* 7.9* 7.9* 7.9*  --   MG  --  1.3* 1.4* 1.3* 1.6* 1.5*   GFR: Estimated Creatinine Clearance: 61.9 mL/min (by C-G formula based on SCr of 0.8 mg/dL). Liver Function Tests:  Recent Labs Lab 11/14/15 1658  AST 19  ALT 15*  ALKPHOS 43  BILITOT 1.0  PROT 6.4*  ALBUMIN 3.6    Recent Labs Lab  11/14/15 1703  LIPASE 30   No results for input(s): AMMONIA in the last 168 hours. Coagulation Profile: No results for input(s): INR, PROTIME in the last 168 hours. Cardiac Enzymes: No results for input(s): CKTOTAL, CKMB, CKMBINDEX, TROPONINI in the last 168 hours. BNP (last 3 results) No results for input(s): PROBNP in the last 8760 hours. HbA1C: No results for input(s): HGBA1C in the last 72 hours. CBG:  Recent Labs Lab 11/19/15 2026 11/20/15 0054 11/20/15 0413  11/20/15 0753 11/20/15 1215  GLUCAP 98 88 85 86 104*   Lipid Profile: No results for input(s): CHOL, HDL, LDLCALC, TRIG, CHOLHDL, LDLDIRECT in the last 72 hours. Thyroid Function Tests:  Recent Labs  11/18/15 1725  TSH 3.885   Anemia Panel: No results for input(s): VITAMINB12, FOLATE, FERRITIN, TIBC, IRON, RETICCTPCT in the last 72 hours. Sepsis Labs:  Recent Labs Lab 11/14/15 1703 11/15/15 0106  LATICACIDVEN 2.1* 0.9    No results found for this or any previous visit (from the past 240 hour(s)).       Radiology Studies: Dg Abd 1 View  Result Date: 11/18/2015 CLINICAL DATA:  PEG tube possibly dislodged EXAM: ABDOMEN - 1 VIEW COMPARISON:  11/14/2015 FINDINGS: Gastrostomy tube overlies the gastric bubble in the left upper abdomen, unchanged. Nonobstructive bowel gas pattern. Mild degenerative changes of the lumbar spine. IMPRESSION: Gastrostomy tube overlies the gastric bubble in the left upper abdomen, unchanged. Electronically Signed   By: Julian Hy M.D.   On: 11/18/2015 19:43        Scheduled Meds: . aspirin  81 mg Oral Daily  . atorvastatin  40 mg Oral Daily  . clopidogrel  75 mg Oral Daily  . dronabinol  2.5 mg Oral BID AC  . folic acid  1 mg Oral Daily  . metoCLOPramide (REGLAN) injection  5 mg Intravenous Q6H  . metoprolol tartrate  25 mg Oral Daily  . ranitidine  150 mg Per Tube BID  . senna-docusate  1 tablet Oral BID   Continuous Infusions: . sodium chloride 75 mL/hr at  11/20/15 1203  . feeding supplement (VITAL 1.5 CAL)    . free water       LOS: 2 days    Time spent: 35 minutes.     Debbe Odea, MD [facility] Pager [phone]  If 7PM-7AM, please contact night-coverage [email or web address] Password [email or web address] 11/20/2015, 1:43 PM

## 2015-11-20 NOTE — Procedures (Addendum)
Objective Swallowing Evaluation: Type of Study: MBS-Modified Barium Swallow Study  Patient Details  Name: [name] MRN: [id number] Date of Birth: 1931/04/14  Today's Date: 11/20/2015 Time: SLP Start Time (ACUTE ONLY): 1330-SLP Stop Time (ACUTE ONLY): 1355 SLP Time Calculation (min) (ACUTE ONLY): 25 min  Past Medical History:  Past Medical History:  Diagnosis Date  . Anemia   . CAD (coronary artery disease)    Dr Einar Gip every 6 months  . Cardiomyopathy    EF 25% by echo 2009  . Carotid artery occlusion   . COPD (chronic obstructive pulmonary disease) ([place])    on O2 constantly  . DVT (deep venous thrombosis) ([place])   . Emphysema   . Esophageal cancer ([place])    'causes chest discomfort, liquids and solids"  . History of oxygen administration    Oxygen 3 l/m at rest / 4 l/m when active. 24/7  . Hypertension   . Keeps losing balance   . Myocardial infarction ([place]) 1986  . Peripheral vascular disease ([place])   . Pulmonary nodule    s/p wedge resction, inflammatory, +MAC  . Shortness of breath   . Vitamin B 12 deficiency    Past Surgical History:  Past Surgical History:  Procedure Laterality Date  . ARCH AORTOGRAM N/A 07/21/2011   Procedure: ARCH AORTOGRAM;  Surgeon: Laverda Page, MD;  Location: [facility] CATH LAB;  Service: Cardiovascular;  Laterality: N/A;  . CAROTID ENDARTERECTOMY  2009   triple bypass  . CAROTID-VERTEBRAL BYPASS GRAFT  08/20/2011   Procedure: BYPASS GRAFT CAROTID-VERTEBRAL;  Surgeon: Serafina Mitchell, MD;  Location: [facility] OR;  Service: Vascular;  Laterality: Right;  Right CAROTID-VERTEBRAL TRANSPOSITION  . CORONARY ARTERY BYPASS GRAFT  2009  . ESOPHAGOGASTRODUODENOSCOPY (EGD) WITH PROPOFOL N/A 09/19/2015   Procedure: ESOPHAGOGASTRODUODENOSCOPY (EGD) WITH PROPOFOL;  Surgeon: Clarene Essex, MD;  Location: [facility] ENDOSCOPY;  Service: Endoscopy;  Laterality: N/A;  . IR GENERIC HISTORICAL  11/14/2015   IR CM INJ ANY COLONIC TUBE W/FLUORO 11/14/2015 Darrell K Allred, PA-C [facility]-INTERV  RAD  . LUNG SURGERY  2008   tumor"infection"  . PR VEIN BYPASS GRAFT,AORTO-FEM-POP    . SAVORY DILATION N/A 09/19/2015   Procedure: SAVORY DILATION;  Surgeon: Clarene Essex, MD;  Location: [facility] ENDOSCOPY;  Service: Endoscopy;  Laterality: N/A;  needs carm  . VASCULAR SURGERY     HPI: This 82 year old man was admitted for fatique, weight loss and dehydration.  He has esophageal CA which was dx'd this year and is undergoing concurrent chemo and XRT.  He has had intermittent problems with feeding tube leaking and lightheadedness. PMH:  CABG, CAD, cardiomyopathy, PVD, emphysema, and he uses 3 liters of 02 at baseline  Subjective: pt awake in chair  Assessment / Plan / Recommendation  CHL IP CLINICAL IMPRESSIONS 11/20/2015  Therapy Diagnosis Mild oral phase dysphagia;Mild pharyngeal phase dysphagia  Clinical Impression Pt presents with mild oropharyngeal dysphagia suspect due to current illness/weakness - ? impact of radiation.    SLP only provided pt with liquids and puree due to known esophageal cancer and treatment. Did not test barium tablet or solid.    Delayed oral transiting/coordination noted resulting in piecemealing. No aspiration/penetration noted and overall pharyngeal swallow was strong.[name] He did demonstrate decreased laryngeal closure allowing mild pyriform sinus residuals of liquids - without awareness. Cues to conduct dry swallows effective to clear.  Pt did cough during MBS and barium was not visualized in larynx or trachea.    Upon sweep at end of test, pt  appeared with minimal residuals at distal esophagus without sensation- residual appeared to be mixed with secretions - cued dry swallows helpful to clear.  Radiologist not present.  No regurgitation nor pt complaint of dysphagia noted during MBS. Pt did report sensation of secretions in throat = and this present for six months.  SLP questions if this could contribute to his decreased sensation of residuals.   Using teach back, educated pt  to findings and reinforced effective compensation strategies.  SlP to follow up briefly for pt/family education.    Of note pt state he was to be npo for 3 hours before radiation tx - and radiation tx was scheduled at 1400.  SLP phoned RN (before pt left xray suite) re: this information - she advised she would call XRT to determine if XRT needed to be deferred.    Impact on safety and function Mild aspiration risk      CHL IP TREATMENT RECOMMENDATION 11/20/2015  Treatment Recommendations Therapy as outlined in treatment plan below     No flowsheet data found.  CHL IP DIET RECOMMENDATION 11/20/2015  SLP Diet Recommendations Other (Comment)  Liquid Administration via Cup;Straw  Medication Administration Via alternative means  Compensations Slow rate;Small sips/bites;Other (Comment)  Postural Changes Remain semi-upright after after feeds/meals (Comment);Seated upright at 90 degrees      CHL IP OTHER RECOMMENDATIONS 11/20/2015  Recommended Consults --  Oral Care Recommendations Oral care BID  Other Recommendations --      No flowsheet data found.    CHL IP FREQUENCY AND DURATION 11/20/2015  Speech Therapy Frequency (ACUTE ONLY) min 1 x/week  Treatment Duration 1 week           CHL IP ORAL PHASE 11/20/2015  Oral Phase Impaired  Oral - Pudding Teaspoon --  Oral - Pudding Cup --  Oral - Honey Teaspoon --  Oral - Honey Cup --  Oral - Nectar Teaspoon --  Oral - Nectar Cup Reduced posterior propulsion;Weak lingual manipulation;Delayed oral transit;Piecemeal swallowing;Premature spillage;Lingual/palatal residue;Holding of bolus  Oral - Nectar Straw --  Oral - Thin Teaspoon Weak lingual manipulation;Reduced posterior propulsion;Delayed oral transit;Piecemeal swallowing;Premature spillage  Oral - Thin Cup Weak lingual manipulation;Reduced posterior propulsion;Piecemeal swallowing;Premature spillage;Holding of bolus  Oral - Thin Straw Weak lingual manipulation;Reduced posterior  propulsion;Piecemeal swallowing;Premature spillage;Delayed oral transit  Oral - Puree Weak lingual manipulation;Delayed oral transit;Reduced posterior propulsion;Piecemeal swallowing  Oral - Mech Soft --  Oral - Regular --  Oral - Multi-Consistency --  Oral - Pill --  Oral Phase - Comment --    CHL IP PHARYNGEAL PHASE 11/20/2015  Pharyngeal Phase Impaired  Pharyngeal- Pudding Teaspoon --  Pharyngeal --  Pharyngeal- Pudding Cup --  Pharyngeal --  Pharyngeal- Honey Teaspoon --  Pharyngeal --  Pharyngeal- Honey Cup --  Pharyngeal --  Pharyngeal- Nectar Teaspoon --  Pharyngeal --  Pharyngeal- Nectar Cup Reduced laryngeal elevation  Pharyngeal --  Pharyngeal- Nectar Straw --  Pharyngeal --  Pharyngeal- Thin Teaspoon WFL;Reduced laryngeal elevation  Pharyngeal --  Pharyngeal- Thin Cup Delayed swallow initiation-pyriform sinuses;Pharyngeal residue - pyriform;Reduced laryngeal elevation  Pharyngeal --  Pharyngeal- Thin Straw Pharyngeal residue - pyriform;Reduced laryngeal elevation  Pharyngeal --  Pharyngeal- Puree Delayed swallow initiation-vallecula;Reduced laryngeal elevation  Pharyngeal --  Pharyngeal- Mechanical Soft --  Pharyngeal --  Pharyngeal- Regular --  Pharyngeal --  Pharyngeal- Multi-consistency --  Pharyngeal --  Pharyngeal- Pill --  Pharyngeal --  Pharyngeal Comment cued dry swallows using live video to cue pt helpful to decreased pyriform  sinus residuals     CHL IP CERVICAL ESOPHAGEAL PHASE 11/20/2015  Cervical Esophageal Phase Impaired  Pudding Teaspoon --  Pudding Cup --  Honey Teaspoon --  Honey Cup --  Nectar Teaspoon --  Nectar Cup WFL  Nectar Straw --  Thin Teaspoon WFL  Thin Cup WFL  Thin Straw WFL  Puree WFL  Mechanical Soft --  Regular --  Multi-consistency --  Pill --  Cervical Esophageal Comment upon sweep at end of test, pt appeared with minimal residuals at distal esophagus without sensation- residual appeared to be mixed with secretions  - cued dry swallows helpful to clear     No flowsheet data found.  Luanna Salk, [facility] SLP [phone]

## 2015-11-20 NOTE — Progress Notes (Signed)
Nutrition Follow-up  DOCUMENTATION CODES:   Severe malnutrition in context of chronic illness  INTERVENTION:   Monitor magnesium, potassium, and phosphorus daily for at least 3 days, MD to replete as needed, as pt is at risk for refeeding syndrome.  Advance Vital 1.5 to 20 ml/hr. Advance as tolerated by 10 ml every 8 hours to goal rate of 55 ml/hr. Per MD verbal order, RD to provide continuous free water flushes infusing at 45 ml/hr (providing 1080 ml) At 20 ml/hr, this provides 720 kcal, 32 g protein and 1446 ml H2O.  Tube feeding goal rate: Vital 1.5 @ 55 ml/hr Free water flushes of 180 ml every 4 hours This regimen provides 1980 kcal, 89g protein and 2088 ml H2O.  RD to continue to monitor for tolerance and ability to advance.  NUTRITION DIAGNOSIS:   Malnutrition related to chronic illness as evidenced by energy intake < 75% for > or equal to 1 month, severe depletion of muscle mass, severe depletion of body fat, percent weight loss.  Ongoing.  GOAL:   Patient will meet greater than or equal to 90% of their needs  Progressing.  MONITOR:   PO intake, Labs, TF tolerance, I & O's, Skin, Weight trends  ASSESSMENT:   [name] is a 82 y.o. gentleman with a history of esophageal cancer (diagnosed earlier this year, not a surgical candidate, active chemotherapy and radiation treatments), CAD S/P CABG, combined systolic and diastolic heart failure (EF 35-40% and grade I diastolic dysfunction on last echo), PVD, and prior DVT who was referred to the ED from oncology clinic for evaluation of weakness and adult failure to thrive  Per RN, patient is not tolerating medications and flushes in PEG. He states he feels very nauseous when anything is flushed into tube. Pt has been tolerating Vital 1.5 @ 10 ml/hr with no issue. Per discussion with MD, tube feeding will advance as tolerated towards goal rate. Free water flushes to be provided via continuous infusion. Rate is 45 ml/hr to  provide 1080 ml every 24 hours.  RD to continue to monitor for tolerance and advancement.  Medications: Marinol capsule BID, Folic acid tablet daily, IV Reglan every 6 hours, Zofran PRN Labs reviewed: Low Na, Mg  Diet Order:  Diet clear liquid Room service appropriate? Yes; Fluid consistency: Thin  Skin:  Reviewed, no issues  Last BM:  8/19  Height:   Ht Readings from Last 1 Encounters:  11/14/15 5\' 5"  (1.651 m)    Weight:   Wt Readings from Last 1 Encounters:  11/20/15 146 lb 3.2 oz (66.3 kg)    Ideal Body Weight:  56.81 kg  BMI:  Body mass index is 24.33 kg/m.  Estimated Nutritional Needs:   Kcal:  [id number]  Protein:  83-95 grams  Fluid:  >/= 1.9L  EDUCATION NEEDS:   No education needs identified at this time  Clayton Bibles, MS, RD, LDN Pager: [phone] After Hours Pager: [phone]

## 2015-11-21 ENCOUNTER — Other Ambulatory Visit: Payer: Self-pay | Admitting: *Deleted

## 2015-11-21 ENCOUNTER — Ambulatory Visit
Admission: RE | Admit: 2015-11-21 | Discharge: 2015-11-21 | Disposition: A | Discharge status: Home / Self Care | Payer: Medicare Other | Source: Ambulatory Visit | Attending: Radiation Oncology | Admitting: Radiation Oncology

## 2015-11-21 ENCOUNTER — Ambulatory Visit: Payer: Medicare Other

## 2015-11-21 ENCOUNTER — Other Ambulatory Visit: Payer: Self-pay | Admitting: Hematology

## 2015-11-21 ENCOUNTER — Encounter: Payer: Self-pay | Admitting: Radiation Oncology

## 2015-11-21 DIAGNOSIS — I2510 Atherosclerotic heart disease of native coronary artery without angina pectoris: Secondary | ICD-10-CM

## 2015-11-21 DIAGNOSIS — D61810 Antineoplastic chemotherapy induced pancytopenia: Secondary | ICD-10-CM

## 2015-11-21 DIAGNOSIS — T451X5A Adverse effect of antineoplastic and immunosuppressive drugs, initial encounter: Principal | ICD-10-CM

## 2015-11-21 LAB — MAGNESIUM: Magnesium: 1.6 mg/dL — ABNORMAL LOW (ref 1.7–2.4)

## 2015-11-21 LAB — CBC
HCT: 25.2 % — ABNORMAL LOW (ref 39.0–52.0)
Hemoglobin: 8.8 g/dL — ABNORMAL LOW (ref 13.0–17.0)
MCH: 28.9 pg (ref 26.0–34.0)
MCHC: 34.9 g/dL (ref 30.0–36.0)
MCV: 82.9 fL (ref 78.0–100.0)
PLATELETS: 128 10*3/uL — AB (ref 150–400)
RBC: 3.04 MIL/uL — AB (ref 4.22–5.81)
RDW: 13.6 % (ref 11.5–15.5)
WBC: 2.8 10*3/uL — ABNORMAL LOW (ref 4.0–10.5)

## 2015-11-21 LAB — GLUCOSE, CAPILLARY
GLUCOSE-CAPILLARY: 102 mg/dL — AB (ref 65–99)
GLUCOSE-CAPILLARY: 95 mg/dL (ref 65–99)
GLUCOSE-CAPILLARY: 98 mg/dL (ref 65–99)
Glucose-Capillary: 120 mg/dL — ABNORMAL HIGH (ref 65–99)
Glucose-Capillary: 127 mg/dL — ABNORMAL HIGH (ref 65–99)
Glucose-Capillary: 117 mg/dL — ABNORMAL HIGH (ref 65–99)
Glucose-Capillary: 93 mg/dL (ref 65–99)

## 2015-11-21 LAB — BASIC METABOLIC PANEL
Anion gap: 5 (ref 5–15)
BUN: 10 mg/dL (ref 6–20)
CHLORIDE: 98 mmol/L — AB (ref 101–111)
CO2: 30 mmol/L (ref 22–32)
CREATININE: 0.55 mg/dL — AB (ref 0.61–1.24)
Calcium: 7.9 mg/dL — ABNORMAL LOW (ref 8.9–10.3)
GFR calc non Af Amer: >60 mL/min (ref >60)
GLUCOSE: 123 mg/dL — AB (ref 65–99)
Potassium: 3.4 mmol/L — ABNORMAL LOW (ref 3.5–5.1)
Sodium: 133 mmol/L — ABNORMAL LOW (ref 135–145)

## 2015-11-21 MED ORDER — VITAL 1.5 CAL PO LIQD: 1000.0000 mL | ORAL | Status: DC

## 2015-11-21 MED ORDER — FREE WATER: 1080.0000 mL | Status: AC

## 2015-11-21 MED ORDER — DRONABINOL 2.5 MG PO CAPS: 2.5000 mg | ORAL_CAPSULE | Freq: Two times a day (BID) | ORAL | 0 refills | Status: DC

## 2015-11-21 NOTE — Discharge Summary (Signed)
Physician Discharge Summary  [name] [id number] DOB: 1932/03/03 DOA: 11/14/2015  PCP: [name], MD  Admit date: 11/14/2015 Discharge date: 11/21/2015  Admitted From: home Disposition:  SNF   Recommendations for Outpatient Follow-up:  1. All meds through PEG tube 2. Will go to SNF 3. Follow BP daily and hold antihypertensives if SBP < 100  Discharge Condition:  stable   CODE STATUS:  Full code  Diet recommendation:  Clear liquids Consultations:  IR    Discharge Diagnoses:  Active Problems:   Chronic respiratory failure with hypoxia (HCC)   Dehydration   Severe protein-calorie malnutrition (HCC)   PEG tube malfunction (HCC)   Adult failure to thrive   Antineoplastic chemotherapy induced pancytopenia (HCC)   Hypomagnesemia   CHF (congestive heart failure) (HCC)   CAD (coronary artery disease)   Acid reflux    Subjective: He has no complaints of nausea, vomiting or diarrhea. No pain.   Brief Summary: Roberto Csejteyis a 82 y.o.gentleman with a history of esophageal cancer (diagnosed earlier this year, not a surgical candidate, active chemotherapy and radiation treatments), CAD S/P CABG, combined systolic and diastolic heart failure (EF 35-40% and grade I diastolic dysfunction on last echo), PVD, and prior DVT who was referred to the ED from oncology clinic for evaluation of weakness and adult failure to thrive. He is S/P four cycles of carboplatin and taxol; fifth cycle has been deferred due to his deconditioned stated. He denies fever. He has had a chronic cough productive of clear sputum/white sputum which is unchanged. No hemoptysis. He denies chest pain, pressure, or tightness. He reports intermittent heart burn. He has chronic shortness of breath which he relates to a prior lung surgery for a benign lung mass. He wears 3L [place] at baseline which is chronic and unchanged. He has had intermittent light-headedness but no syncope or LOC. No abdominal pain.  Stools have been loose since starting tube feedings. No dysuria. He denies blood in his urine or stool.  Of note, he has a gastrostomy tube for primary nutrition (he takes very little by mouth anymore), and he has had recurrent problems with leaking and tube malfunction. He also reports that he has had difficulty tolerating the recommended amount of daily tube feedings to meet his nutritional needs (should be on Osmolite 1.5, 6 cans daily with 120cc of free water with each can). He has not been getting the recommended amount of free water either.  Admitted with failure to thrive & dehydration.     Hospital Course:  Esophageal cancer- stage 3 -  actively undergoing chemotherapy and radiation treatments, who presents with pancytopenia and dehydration.  - last chemo with Taxol and Carboplatin earlier this month- 5th cycle on hold due to FTT - continue radiation treatments- today is the last treatment  Dysphagia - states that when he tried to swallow liquids, he regurgitates them - MBS did not confirm major issues with his swallowing- he has mild oro-pharyngeal dysphagia and no aspiration  Nutrition; G tube malfunction??  -G tube, evaluated by IR 8-18 and noted to be working properly  -He has not been able to tolerates tube feeding and therefore formula was change to Vital.  -Reglan added by his oncologist to help with motility issues  - possibly part of his issues is psychological as he stated he did not tolerate bolus free water as it caused nausea and vomiting- vomiting was not witnessed in the hospital   -  tube feeds resumed- we have been able to increase  rate slowly to his goal rate of 55cc/hr while giving free water continuously via pump rather than as boluses- He is to received 1080 cc of free water in 24 hr period - He is tolerating this regimen quite well without leakage from tube and without vomiting.   FTT/ severe protein calorie malnutrition - significant amount of  weight loss likely due to inadequate nutrition followed by inability to ambulate for about 3 wks now  - will go to SNF for rehab  COPD on 3 L O2 at baseline  - stable  Pancytopenia -likely due to chemo- improving - has been afebrile  Diarrhea -  Resolved.   Hypomagnesemia (borderline) - IV replacement ordered.   CAD s/p CABG - Continue aspirin, plavix, BB, statin for now.  - Agree with holding ARB due to renal insufficiency.  Combined CHF - compensated at this point --BNP at 175.   - cont Losartan and Metoprolol as BP tolerates  PVD with stents in lower extermities - ASA and Plavix  Discharge Instructions  Discharge Instructions    Discharge instructions    Complete by:  As directed   Clear liquids as tolerated- all meds through PEG tube.   Increase activity slowly    Complete by:  As directed       Medication List    TAKE these medications   albuterol 108 (90 Base) MCG/ACT inhaler Commonly known as:  PROAIR HFA Inhale 2 puffs into the lungs every 6 (six) hours as needed. For shortness of breath   albuterol (2.5 MG/3ML) 0.083% nebulizer solution Commonly known as:  PROVENTIL Take 3 mLs (2.5 mg total) by nebulization 4 (four) times daily as needed. For shortness of breath   aspirin 81 MG chewable tablet Chew 81 mg by mouth daily. Reported on 10/11/2015   atorvastatin 40 MG tablet Commonly known as:  LIPITOR Take 40 mg by mouth daily. Reported on 10/11/2015   cholecalciferol 1000 units tablet Commonly known as:  VITAMIN D Take 1,000 Units by mouth daily.   clopidogrel 75 MG tablet Commonly known as:  PLAVIX Take 75 mg by mouth daily. Reported on 10/11/2015   dexamethasone 4 MG tablet Commonly known as:  DECADRON Take 2 tablets (8 mg total) by mouth daily. Start the day after chemotherapy for 2 days.   dronabinol 2.5 MG capsule Commonly known as:  MARINOL Take 1 capsule (2.5 mg total) by mouth 2 (two) times daily. What changed:  when to take  this   feeding supplement (VITAL 1.5 CAL) Liqd Place 1,000 mLs into feeding tube continuous.   folic acid 1 MG tablet Commonly known as:  FOLVITE Take 1 mg by mouth daily. Reported on 10/11/2015   free water Soln Place 1,080 mLs into feeding tube continuous.   losartan 25 MG tablet Commonly known as:  COZAAR Take 25 mg by mouth daily.   metoCLOPramide 5 MG tablet Commonly known as:  REGLAN Take 5 mg by mouth every 6 (six) hours as needed for nausea or vomiting.   metoCLOPramide 10 MG tablet Commonly known as:  REGLAN Take 1 tablet (10 mg total) by mouth 3 (three) times daily before meals.   metoprolol tartrate 25 MG tablet Commonly known as:  LOPRESSOR Take 25 mg by mouth daily.   ondansetron 8 MG tablet Commonly known as:  ZOFRAN Take 1 tablet (8 mg total) by mouth 2 (two) times daily as needed for refractory nausea / vomiting. Start on day 3 after chemo.   prochlorperazine 10 MG  tablet Commonly known as:  COMPAZINE Take 1 tablet (10 mg total) by mouth every 6 (six) hours as needed (Nausea or vomiting).   senna-docusate 8.6-50 MG tablet Commonly known as:  SENNA S Take 2 tablets by mouth 2 (two) times daily.   SONAFINE Apply 1 application topically 2 (two) times daily.   vitamin B-12 1000 MCG tablet Commonly known as:  CYANOCOBALAMIN Take 1,000 mcg by mouth daily.       Allergies  Allergen Reactions  . Symbicort [Budesonide-Formoterol Fumarate] Nausea And Vomiting  . Tetanus Toxoids Other (See Comments)    In 1958 he got the tetanus shot and "blacked out"     Procedures/Studies:   Dg Abd 1 View  Result Date: 11/18/2015 CLINICAL DATA:  PEG tube possibly dislodged EXAM: ABDOMEN - 1 VIEW COMPARISON:  11/14/2015 FINDINGS: Gastrostomy tube overlies the gastric bubble in the left upper abdomen, unchanged. Nonobstructive bowel gas pattern. Mild degenerative changes of the lumbar spine. IMPRESSION: Gastrostomy tube overlies the gastric bubble in the left upper  abdomen, unchanged. Electronically Signed   By: Julian Hy M.D.   On: 11/18/2015 19:43   Ir Cm Inj Any Colonic Tube W/fluoro  Result Date: 11/14/2015 INDICATION: Malnutrition, gastrostomy, leakage at the skin site EXAM: GI TUBE INJECTION MEDICATIONS: None. ANESTHESIA/SEDATION: None. CONTRAST:  10 cc Isovue - administered into the gastric lumen. FLUOROSCOPY TIME:  Fluoroscopy Time:  6 seconds (1 mGy). COMPLICATIONS: None immediate. PROCEDURE: Under fluoroscopy, the existing gastrostomy was injected with contrast. This confirms position in the stomach and tube patency. No leakage or extravasation. No discontinuity. Gastrostomy was secured externally. The dressing was exchange. Mild erythema at the site secondary to skin irritation. IMPRESSION: Patent gastrostomy tube within the stomach. This was re- secured externally to prevent further leakage. Feeding tube ready for use. Electronically Signed   By: Jerilynn Mages.  Shick M.D.   On: 11/14/2015 15:16   Dg Abd Acute W/chest  Result Date: 11/14/2015 CLINICAL DATA:  Esophageal cancer, weakness, shortness of breath, dehydration, former smoker, coronary artery disease post MI, hypertension, COPD/emphysema EXAM: DG ABDOMEN ACUTE W/ 1V CHEST COMPARISON:  Chest radiograph 05/17/2015 FINDINGS: RIGHT jugular Port-A-Cath with tip projecting over SVC. Minimal enlargement of cardiac silhouette post CABG. Atherosclerotic calcification aorta. Mediastinal contours normal. Emphysematous changes consistent with COPD. Postsurgical changes in the LEFT perihilar region. Bibasilar atelectasis. No definite infiltrate, pleural effusion or pneumothorax. Nonobstructive bowel gas pattern. No bowel dilatation or bowel wall thickening, or free air. Scattered radiodense tearing small bowel and colon. Probable bladder dysphagia. Diffuse osseous demineralization. IMPRESSION: Post CABG. COPD changes with bibasilar atelectasis. No acute abdominal findings. Aortic atherosclerosis. Electronically  Signed   By: Lavonia Dana M.D.   On: 11/14/2015 17:10   Dg Swallowing Func-speech Pathology  Result Date: 11/20/2015 Objective Swallowing Evaluation: Type of Study: MBS-Modified Barium Swallow Study Patient Details Name: Jaiver Deni MRN: [id number] Date of Birth: 01/29/1932 Today's Date: 11/20/2015 Time: SLP Start Time (ACUTE ONLY): 1340-SLP Stop Time (ACUTE ONLY): 1355 SLP Time Calculation (min) (ACUTE ONLY): 15 min Past Medical History: Past Medical History: Diagnosis Date . Anemia  . CAD (coronary artery disease)   Dr Einar Gip every 6 months . Cardiomyopathy   EF 25% by echo 2009 . Carotid artery occlusion  . COPD (chronic obstructive pulmonary disease) ([place])   on O2 constantly . DVT (deep venous thrombosis) ([place])  . Emphysema  . Esophageal cancer ([place])   'causes chest discomfort, liquids and solids" . History of oxygen administration   Oxygen 3 l/m at rest /  4 l/m when active. 24/7 . Hypertension  . Keeps losing balance  . Myocardial infarction ([place]) 1986 . Peripheral vascular disease ([place])  . Pulmonary nodule   s/p wedge resction, inflammatory, +MAC . Shortness of breath  . Vitamin B 12 deficiency  Past Surgical History: Past Surgical History: Procedure Laterality Date . ARCH AORTOGRAM N/A 07/21/2011  Procedure: ARCH AORTOGRAM;  Surgeon: Laverda Page, MD;  Location: [facility] CATH LAB;  Service: Cardiovascular;  Laterality: N/A; . CAROTID ENDARTERECTOMY  2009  triple bypass . CAROTID-VERTEBRAL BYPASS GRAFT  08/20/2011  Procedure: BYPASS GRAFT CAROTID-VERTEBRAL;  Surgeon: Serafina Mitchell, MD;  Location: [facility] OR;  Service: Vascular;  Laterality: Right;  Right CAROTID-VERTEBRAL TRANSPOSITION . CORONARY ARTERY BYPASS GRAFT  2009 . ESOPHAGOGASTRODUODENOSCOPY (EGD) WITH PROPOFOL N/A 09/19/2015  Procedure: ESOPHAGOGASTRODUODENOSCOPY (EGD) WITH PROPOFOL;  Surgeon: Clarene Essex, MD;  Location: [facility] ENDOSCOPY;  Service: Endoscopy;  Laterality: N/A; . IR GENERIC HISTORICAL  11/14/2015  IR CM INJ ANY COLONIC TUBE W/FLUORO 11/14/2015  Darrell K Allred, PA-C [facility]-INTERV RAD . LUNG SURGERY  2008  tumor"infection" . PR VEIN BYPASS GRAFT,AORTO-FEM-POP   . SAVORY DILATION N/A 09/19/2015  Procedure: SAVORY DILATION;  Surgeon: Clarene Essex, MD;  Location: [facility] ENDOSCOPY;  Service: Endoscopy;  Laterality: N/A;  needs carm . VASCULAR SURGERY   HPI: This 83 year old man was admitted for fatique, weight loss and dehydration.  He has esophageal CA which was dx'd this year and is undergoing concurrent chemo and XRT.  He has had intermittent problems with feeding tube leaking and lightheadedness. PMH:  CABG, CAD, cardiomyopathy, PVD, emphysema, and he uses 3 liters of 02 at baseline Subjective: pt awake in chair Assessment / Plan / Recommendation CHL IP CLINICAL IMPRESSIONS 11/20/2015 Therapy Diagnosis Mild oral phase dysphagia;Mild pharyngeal phase dysphagia Clinical Impression Pt presents with mild oropharyngeal dysphagia suspect due to current illness/weakness - ? impact of radiation.    SLP only provided pt with liquids and puree due to known esophageal cancer and treatment. Did not test barium tablet or solid.  Delayed oral transiting/coordination noted resulting in piecemealing. No aspiration/penetration noted and pt did not complain of dysphagia during testing. He did demonstrate decreased laryngeal closure allowing pyriform sinus residuals of liquids - without awareness. Cues to conduct dry swallows effective to clear.  Pt did cough during MBS and barium was not visualized in larynx or trachea.  Upon sweep at end of test, pt appeared with minimal residuals at distal esophagus without sensation- residual appeared to be mixed with secretions - cued dry swallows helpful to clear.  Radiologist not present.  Using teach back, educated pt to findings and reinforced effective compensation strategies.  SlP to follow up briefly for pt/family education.  Of note pt state he was to be npo for 3 hours before radiation tx - and radiation tx was scheduled at 1400.  SLP  phoned RN (before pt left xray suite) re: this information - she advised she would call XRT to determine if XRT needed to be deferred.      Impact on safety and function Mild aspiration risk   CHL IP TREATMENT RECOMMENDATION 11/20/2015 Treatment Recommendations Therapy as outlined in treatment plan below   No flowsheet data found. CHL IP DIET RECOMMENDATION 11/20/2015 SLP Diet Recommendations Other (Comment) Liquid Administration via Cup;Straw Medication Administration Via alternative means Compensations Slow rate;Small sips/bites;Other (Comment) Postural Changes Remain semi-upright after after feeds/meals (Comment);Seated upright at 90 degrees   CHL IP OTHER RECOMMENDATIONS 11/20/2015 Recommended Consults -- Oral Care Recommendations Oral  care BID Other Recommendations --   No flowsheet data found.  CHL IP FREQUENCY AND DURATION 11/20/2015 Speech Therapy Frequency (ACUTE ONLY) min 1 x/week Treatment Duration 1 week      CHL IP ORAL PHASE 11/20/2015 Oral Phase Impaired Oral - Pudding Teaspoon -- Oral - Pudding Cup -- Oral - Honey Teaspoon -- Oral - Honey Cup -- Oral - Nectar Teaspoon -- Oral - Nectar Cup Reduced posterior propulsion;Weak lingual manipulation;Delayed oral transit;Piecemeal swallowing;Premature spillage;Lingual/palatal residue;Holding of bolus Oral - Nectar Straw -- Oral - Thin Teaspoon Weak lingual manipulation;Reduced posterior propulsion;Delayed oral transit;Piecemeal swallowing;Premature spillage Oral - Thin Cup Weak lingual manipulation;Reduced posterior propulsion;Piecemeal swallowing;Premature spillage;Holding of bolus Oral - Thin Straw Weak lingual manipulation;Reduced posterior propulsion;Piecemeal swallowing;Premature spillage;Delayed oral transit Oral - Puree Weak lingual manipulation;Delayed oral transit;Reduced posterior propulsion;Piecemeal swallowing Oral - Mech Soft -- Oral - Regular -- Oral - Multi-Consistency -- Oral - Pill -- Oral Phase - Comment --  CHL IP PHARYNGEAL PHASE 11/20/2015  Pharyngeal Phase Impaired Pharyngeal- Pudding Teaspoon -- Pharyngeal -- Pharyngeal- Pudding Cup -- Pharyngeal -- Pharyngeal- Honey Teaspoon -- Pharyngeal -- Pharyngeal- Honey Cup -- Pharyngeal -- Pharyngeal- Nectar Teaspoon -- Pharyngeal -- Pharyngeal- Nectar Cup Reduced laryngeal elevation Pharyngeal -- Pharyngeal- Nectar Straw -- Pharyngeal -- Pharyngeal- Thin Teaspoon WFL;Reduced laryngeal elevation Pharyngeal -- Pharyngeal- Thin Cup Delayed swallow initiation-pyriform sinuses;Pharyngeal residue - pyriform;Reduced laryngeal elevation Pharyngeal -- Pharyngeal- Thin Straw Pharyngeal residue - pyriform;Reduced laryngeal elevation Pharyngeal -- Pharyngeal- Puree Delayed swallow initiation-vallecula;Reduced laryngeal elevation Pharyngeal -- Pharyngeal- Mechanical Soft -- Pharyngeal -- Pharyngeal- Regular -- Pharyngeal -- Pharyngeal- Multi-consistency -- Pharyngeal -- Pharyngeal- Pill -- Pharyngeal -- Pharyngeal Comment cued dry swallows using live video to cue pt helpful to decreased pyriform sinus residuals  CHL IP CERVICAL ESOPHAGEAL PHASE 11/20/2015 Cervical Esophageal Phase Impaired Pudding Teaspoon -- Pudding Cup -- Honey Teaspoon -- Honey Cup -- Nectar Teaspoon -- Nectar Cup WFL Nectar Straw -- Thin Teaspoon WFL Thin Cup WFL Thin Straw WFL Puree WFL Mechanical Soft -- Regular -- Multi-consistency -- Pill -- Cervical Esophageal Comment upon sweep at end of test, pt appeared with minimal residuals at distal esophagus without sensation- residual appeared to be mixed with secretions - cued dry swallows helpful to clear  No flowsheet data found. Luanna Salk, [facility] SLP [phone]                  Discharge Exam: Vitals:   11/20/15 2040 11/21/15 0500  BP: 126/63 (!) 99/55  Pulse: (!) 101 (!) 104  Resp: 16 16  Temp: 97.7 F (36.5 C) 98.3 F (36.8 C)   Vitals:   11/20/15 0430 11/20/15 1520 11/20/15 2040 11/21/15 0500  BP: (!) 115/56 124/79 126/63 (!) 99/55  Pulse: (!) 108 98 (!) 101 (!) 104  Resp: 20  18 16 16   Temp: 98.4 F (36.9 C) 98.2 F (36.8 C) 97.7 F (36.5 C) 98.3 F (36.8 C)  TempSrc: Oral Oral Oral Oral  SpO2: 99% 100% 100% 99%  Weight: 66.3 kg (146 lb 3.2 oz)   66 kg (145 lb 6.4 oz)  Height:        General: Pt is alert, awake, not in acute distress Cardiovascular: RRR, S1/S2 +, no rubs, no gallops Respiratory: CTA bilaterally, no wheezing, no rhonchi Abdominal: Soft, NT, ND, bowel sounds + Extremities: no edema, no cyanosis    The results of significant diagnostics from this hospitalization (including imaging, microbiology, ancillary and laboratory) are listed below for reference.     Microbiology: No results found  for this or any previous visit (from the past 240 hour(s)).   Labs: BNP (last 3 results)  Recent Labs  11/15/15 0537  BNP [id number]*   Basic Metabolic Panel:  Recent Labs Lab 11/16/15 0540 11/17/15 0500 11/18/15 0500 11/19/15 0500 11/20/15 0500 11/21/15 0420  NA 142 137 133* 132*  --  133*  K 3.3* 3.8 3.6 3.9  --  3.4*  CL 110 106 99* 98*  --  98*  CO2 27 28 29 30   --  30  GLUCOSE 114* 126* 128* 87  --  123*  BUN 26* 16 14 11   --  10  CREATININE 0.83 0.77 0.67 0.71  --  0.55*  CALCIUM 8.1* 7.9* 7.9* 7.9*  --  7.9*  MG 1.3* 1.4* 1.3* 1.6* 1.5* 1.6*   Liver Function Tests:  Recent Labs Lab 11/14/15 1658  AST 19  ALT 15*  ALKPHOS 43  BILITOT 1.0  PROT 6.4*  ALBUMIN 3.6    Recent Labs Lab 11/14/15 1703  LIPASE 30   No results for input(s): AMMONIA in the last 168 hours. CBC:  Recent Labs Lab 11/14/15 1658 11/15/15 0537 11/16/15 0540 11/17/15 0500 11/18/15 0500 11/19/15 0500 11/21/15 0420  WBC 1.5* 1.0* 1.0* 2.0* 3.3* 3.5* 2.8*  NEUTROABS 1.1* 0.7* 0.6* 1.4* 2.5  --   --   HGB 12.1* 8.8* 9.0* 9.1* 9.4* 9.3* 8.8*  HCT 35.9* 26.2* 26.7* 27.1* 27.4* 27.0* 25.2*  MCV 85.1 83.7 83.2 84.4 83.0 82.8 82.9  PLT 130* 110* 113* 127* 133* 130* 128*   Cardiac Enzymes: No results for input(s): CKTOTAL, CKMB, CKMBINDEX,  TROPONINI in the last 168 hours. BNP: Invalid input(s): POCBNP CBG:  Recent Labs Lab 11/20/15 2036 11/21/15 0044 11/21/15 0422 11/21/15 0746 11/21/15 1157  GLUCAP 95 127* 120* 98 117*   D-Dimer No results for input(s): DDIMER in the last 72 hours. Hgb A1c No results for input(s): HGBA1C in the last 72 hours. Lipid Profile No results for input(s): CHOL, HDL, LDLCALC, TRIG, CHOLHDL, LDLDIRECT in the last 72 hours. Thyroid function studies  Recent Labs  11/18/15 1725  TSH 3.885   Anemia work up No results for input(s): VITAMINB12, FOLATE, FERRITIN, TIBC, IRON, RETICCTPCT in the last 72 hours. Urinalysis    Component Value Date/Time   COLORURINE YELLOW 11/15/2015 2101   APPEARANCEUR CLEAR 11/15/2015 2101   LABSPEC 1.022 11/15/2015 2101   PHURINE 5.0 11/15/2015 2101   GLUCOSEU NEGATIVE 11/15/2015 2101   HGBUR NEGATIVE 11/15/2015 2101   [place] NEGATIVE 11/15/2015 2101   KETONESUR NEGATIVE 11/15/2015 2101   PROTEINUR NEGATIVE 11/15/2015 2101   UROBILINOGEN 0.2 08/18/2011 1412   NITRITE NEGATIVE 11/15/2015 2101   LEUKOCYTESUR NEGATIVE 11/15/2015 2101   Sepsis Labs Invalid input(s): PROCALCITONIN,  WBC,  LACTICIDVEN Microbiology No results found for this or any previous visit (from the past 240 hour(s)).   Time coordinating discharge: Over 30 minutes  SIGNED:   Debbe Odea, MD  [facility] 11/21/2015, 12:18 PM Pager   If 7PM-7AM, please contact night-coverage [email or web address] Password [email or web address]

## 2015-11-21 NOTE — Progress Notes (Signed)
Nutrition Follow-up  DOCUMENTATION CODES:   Severe malnutrition in context of chronic illness  INTERVENTION:   Monitor magnesium, potassium, and phosphorus daily for at least 3 days, MD to replete as needed, as pt is at risk for refeeding syndrome.  Advance Vital 1.5 at 40 ml/hr by 10 ml to 50 ml/hr. After 4 hours at 50 ml/hr, advance to 55 ml/hr. Continue continuous free water flushes infusing at 45 ml/hr (providing 1080 ml)  Tube feeding goal rate: Vital 1.5 @ 55 ml/hr Free water flushes of 180 ml every 4 hours This regimen provides 1980 kcal, 89g protein and 2088 ml H2O.  RD to continue to monitor for tolerance   NUTRITION DIAGNOSIS:   Malnutrition related to chronic illness as evidenced by energy intake < 75% for > or equal to 1 month, severe depletion of muscle mass, severe depletion of body fat, percent weight loss.  Ongoing.  GOAL:   Patient will meet greater than or equal to 90% of their needs  Progressing toward goal.  MONITOR:   PO intake, Labs, TF tolerance, I & O's, Skin, Weight trends  ASSESSMENT:   [name] is a 82 y.o. gentleman with a history of esophageal cancer (diagnosed earlier this year, not a surgical candidate, active chemotherapy and radiation treatments), CAD S/P CABG, combined systolic and diastolic heart failure (EF 35-40% and grade I diastolic dysfunction on last echo), PVD, and prior DVT who was referred to the ED from oncology clinic for evaluation of weakness and adult failure to thrive  Per MD and RN, pt is tolerating Vital 1.5 @ 40 ml/hr and continuous free water infusion @ 45 ml/hr. This is currently providing 1440 kcal (75% of needs), 65 g protein (78% of needs) and 733 ml H2O. Plan to advance to goal rate today. Per MD, anticipating discharge after radiation therapy (which is his last scheduled treatment). Pt still at risk of refeeding syndrome. Please replete as needed.  Medications: Marinol capsule BID, Folic acid tablet daily, IV  Reglan every 6 hours, Senokot tablet BID, Zofran PRN Labs reviewed: Low Na, K, Mg  Diet Order:  Diet clear liquid Room service appropriate? Yes; Fluid consistency: Thin  Skin:  Reviewed, no issues  Last BM:  8/19  Height:   Ht Readings from Last 1 Encounters:  11/14/15 5\' 5"  (1.651 m)    Weight:   Wt Readings from Last 1 Encounters:  11/21/15 145 lb 6.4 oz (66 kg)    Ideal Body Weight:  56.81 kg  BMI:  Body mass index is 24.2 kg/m.  Estimated Nutritional Needs:   Kcal:  [id number]  Protein:  83-95 grams  Fluid:  >/= 1.9L  EDUCATION NEEDS:   No education needs identified at this time  Clayton Bibles, MS, RD, LDN Pager: [phone] After Hours Pager: [phone]

## 2015-11-21 NOTE — Care Management Important Message (Signed)
Important Message  Patient Details  Name: Dontrez Vanalstine MRN: [id number] Date of Birth: 01/29/1932   Medicare Important Message Given:  Yes    Camillo Flaming 11/21/2015, 9:07 AMImportant Message  Patient Details  Name: Micha Vanvliet MRN: [id number] Date of Birth: 1931-05-22   Medicare Important Message Given:  Yes    Camillo Flaming 11/21/2015, 9:07 AM

## 2015-11-21 NOTE — Progress Notes (Addendum)
Occupational Therapy Treatment Patient Details Name: [name] MRN: [id number] DOB: 05/15/1931 Today's Date: 11/21/2015    History of present illness This 82 year old man was admitted for fatique, weight loss and dehydration.  He has esophageal CA which was dx'd this year and is undergoing concurrent chemo and XRT.  He has had intermittent problems with feeding tube leaking and lightheadedness. PMH:  CABG, CAD, cardiomyopathy, PVD, emphysema, and he uses 3 liters of 02 at baseline   OT comments  Worked on progressing theraband exercises this visit. Pt performed 3 sets of exercises (FF and horizontal abduction) but reports feeling fatigued after this minimal exercise session. Needs frequent verbal cues for how to perform exercises correctly. Will continue to follow.     Follow Up Recommendations  SNF;Supervision/Assistance - 24 hour    Equipment Recommendations  None recommended by OT    Recommendations for Other Services      Precautions / Restrictions Precautions Precautions: Fall Precaution Comments: chronic O2 3 lts at home Restrictions Weight Bearing Restrictions: No       Mobility Bed Mobility                  Transfers                      Balance                                   ADL                                                Vision                     Perception     Praxis      Cognition   Behavior During Therapy: WFL for tasks assessed/performed Overall Cognitive Status: Within Functional Limits for tasks assessed                       Extremity/Trunk Assessment               Exercises Other Exercises Other Exercises: Could not locate level one theraband in room so reissued a piece of theraband to pt. Pt educated on FF with band and pt did perform X 10 reps both R and L UE today. Additional pt performed X 10 horizontal abduction with band. Pt needs frequent verbal cues  for how to correctly perform exercises and proper hand placement. Pt performed exercises from bed level this visit. Pt reports fatigue after minimal exercise this session.   Shoulder Instructions       General Comments      Pertinent Vitals/ Pain       Pain Assessment: No/denies pain  Home Living                                          Prior Functioning/Environment              Frequency Min 2X/week     Progress Toward Goals  OT Goals(current goals can now be found in the care plan section)  Progress towards OT goals: Progressing toward goals  Plan      Co-evaluation                 End of Session     Activity Tolerance Patient limited by fatigue   Patient Left in bed;with call bell/phone within reach;with bed alarm set   Nurse Communication          Time: 1125-1140 OT Time Calculation (min): 15 min  Charges: OT General Charges $OT Visit: 1 Procedure OT Treatments $Therapeutic Exercise: 8-22 mins  [name]  [id number] 11/21/2015, 11:57 AM

## 2015-11-21 NOTE — Clinical Social Work Placement (Signed)
   CLINICAL SOCIAL WORK PLACEMENT  NOTE  Date:  11/21/2015  Patient Details  Name: [name] MRN: [id number] Date of Birth: 08/30/1931  Clinical Social Work is seeking post-discharge placement for this patient at the [place] level of care (*CSW will initial, date and re-position this form in  chart as items are completed):  Yes   Patient/family provided with [place] Work Department's list of facilities offering this level of care within the geographic area requested by the patient (or if unable, by the patient's family).  Yes   Patient/family informed of their freedom to choose among providers that offer the needed level of care, that participate in Medicare, Medicaid or managed care program needed by the patient, have an available bed and are willing to accept the patient.  Yes   Patient/family informed of [facility]'s ownership interest in [facility] and [facility], as well as of the fact that they are under no obligation to receive care at these facilities.  PASRR submitted to EDS on       PASRR number received on       Existing PASRR number confirmed on 11/19/15     FL2 transmitted to all facilities in geographic area requested by pt/family on 11/19/15     FL2 transmitted to all facilities within larger geographic area on       Patient informed that his/her managed care company has contracts with or will negotiate with certain facilities, including the following:        Yes   Patient/family informed of bed offers received.  Patient chooses bed at [place] recommends and patient chooses bed at      Patient to be transferred to [facility] on 11/21/15.  Patient to be transferred to facility by [facility]     Patient family notified on 11/21/15 of transfer.  Name of family member notified:        PHYSICIAN       Additional Comment: Pt / son are in agreement with d/c to [facility]  today. [facility] transport is required. Medical necessity form completed. Pt requires oxygen. Pt is aware out of pocket costs may be associated with [facility] transport. D/C Summary sent to SNF for review. Scripts included in d/c packet. # for report provided to nsg.   _______________________________________________ Luretha Rued, [place]   [phone] 11/21/2015, 3:04 PM

## 2015-11-21 NOTE — Progress Notes (Signed)
Report called to [facility] facility.

## 2015-11-22 ENCOUNTER — Non-Acute Institutional Stay (SKILLED_NURSING_FACILITY): Payer: Medicare Other | Admitting: Nurse Practitioner

## 2015-11-22 ENCOUNTER — Ambulatory Visit: Payer: Medicare Other

## 2015-11-22 ENCOUNTER — Encounter: Payer: Self-pay | Admitting: Nurse Practitioner

## 2015-11-22 ENCOUNTER — Encounter: Payer: Self-pay | Admitting: *Deleted

## 2015-11-22 DIAGNOSIS — I10 Essential (primary) hypertension: Secondary | ICD-10-CM | POA: Diagnosis not present

## 2015-11-22 DIAGNOSIS — E860 Dehydration: Secondary | ICD-10-CM

## 2015-11-22 DIAGNOSIS — C155 Malignant neoplasm of lower third of esophagus: Secondary | ICD-10-CM

## 2015-11-22 DIAGNOSIS — R112 Nausea with vomiting, unspecified: Secondary | ICD-10-CM | POA: Diagnosis not present

## 2015-11-22 DIAGNOSIS — R627 Adult failure to thrive: Secondary | ICD-10-CM

## 2015-11-22 DIAGNOSIS — J449 Chronic obstructive pulmonary disease, unspecified: Secondary | ICD-10-CM

## 2015-11-22 DIAGNOSIS — I2510 Atherosclerotic heart disease of native coronary artery without angina pectoris: Secondary | ICD-10-CM | POA: Diagnosis not present

## 2015-11-22 NOTE — Progress Notes (Signed)
RN called patient to inform him of follow up visit. No answer/busy. Left voicemail to call [place] back.

## 2015-11-22 NOTE — Progress Notes (Signed)
Nursing Home Location:  [facility]   Place of Service: SNF (31)  PCP: [name], MD  Allergies  Allergen Reactions  . Silver   . Symbicort [Budesonide-Formoterol Fumarate] Nausea And Vomiting  . Tetanus Toxoids Other (See Comments)    In 1958 he got the tetanus shot and "blacked out"    Chief Complaint  Patient presents with  . Hospitalization Follow-up    [facility] stay from 11/14/15 to 11/21/15    HPI:  Patient is a 82 y.o. male seen today at [facility] to follow up hospitalization. Pt with history of esophageal cancer (diagnosed earlier this year, not a surgical candidate, active chemotherapy and radiation treatments), CAD S/P CABG, combined systolic and diastolic heart failure (EF 35-40% and grade I diastolic dysfunction on last echo), PVD, and prior DVT who was referred to the ED from oncology clinic for evaluation of weakness and adult failure to thrive.  Due to cancer he has a gastrostomy tube for primary nutrition (he takes very little by mouth anymore), and he has had recurrent problems with leaking and tube malfunction. He also reported difficulty tolerating the recommended amount of daily tube feedings to meet his nutritional needs and was not getting the amount of free water either. Pt was admitted and treated for failure to thrive &dehydration. pt now at [facility] for ongoing physical and occupational therapy as well as nursing support.   Review of Systems:  Review of Systems  Constitutional: Positive for fatigue. Negative for chills and fever.  HENT: Negative for tinnitus.   Respiratory: Negative for cough and shortness of breath.   Cardiovascular: Negative for chest pain, palpitations and leg swelling.  Gastrointestinal: Positive for nausea. Negative for abdominal pain.       Had diarrhea but has not had BM in a few days   Genitourinary: Negative for dysuria, frequency and urgency.  Musculoskeletal: Negative for back pain and  myalgias.  Skin: Negative.   Neurological: Negative for dizziness and headaches.    Past Medical History:  Diagnosis Date  . Anemia   . CAD (coronary artery disease)    Dr Einar Gip every 6 months  . Cardiomyopathy    EF 25% by echo 2009  . Carotid artery occlusion   . COPD (chronic obstructive pulmonary disease) ([place])    on O2 constantly  . DVT (deep venous thrombosis) ([place])   . Emphysema   . Esophageal cancer ([place])    'causes chest discomfort, liquids and solids"  . History of oxygen administration    Oxygen 3 l/m at rest / 4 l/m when active. 24/7  . Hypertension   . Keeps losing balance   . Myocardial infarction ([place]) 1986  . Peripheral vascular disease ([place])   . Pulmonary nodule    s/p wedge resction, inflammatory, +MAC  . Shortness of breath   . Vitamin B 12 deficiency    Past Surgical History:  Procedure Laterality Date  . ARCH AORTOGRAM N/A 07/21/2011   Procedure: ARCH AORTOGRAM;  Surgeon: Laverda Page, MD;  Location: [facility] CATH LAB;  Service: Cardiovascular;  Laterality: N/A;  . CAROTID ENDARTERECTOMY  2009   triple bypass  . CAROTID-VERTEBRAL BYPASS GRAFT  08/20/2011   Procedure: BYPASS GRAFT CAROTID-VERTEBRAL;  Surgeon: Serafina Mitchell, MD;  Location: [facility] OR;  Service: Vascular;  Laterality: Right;  Right CAROTID-VERTEBRAL TRANSPOSITION  . CORONARY ARTERY BYPASS GRAFT  2009  . ESOPHAGOGASTRODUODENOSCOPY (EGD) WITH PROPOFOL N/A 09/19/2015   Procedure: ESOPHAGOGASTRODUODENOSCOPY (EGD) WITH PROPOFOL;  Surgeon:  Clarene Essex, MD;  Location: [facility];  Service: Endoscopy;  Laterality: N/A;  . IR GENERIC HISTORICAL  11/14/2015   IR CM INJ ANY COLONIC TUBE W/FLUORO 11/14/2015 Darrell K Allred, PA-C [facility]-INTERV RAD  . LUNG SURGERY  2008   tumor"infection"  . PR VEIN BYPASS GRAFT,AORTO-FEM-POP    . SAVORY DILATION N/A 09/19/2015   Procedure: SAVORY DILATION;  Surgeon: Clarene Essex, MD;  Location: [facility] ENDOSCOPY;  Service: Endoscopy;  Laterality: N/A;  needs carm  . VASCULAR SURGERY      Social History:   reports that he quit smoking about 9 years ago. His smoking use included Cigarettes. He has a 112.00 pack-year smoking history. He has never used smokeless tobacco. He reports that he does not drink alcohol or use drugs.  Family History  Problem Relation Age of Onset  . Heart disease Father   . Diabetes Sister   . Anesthesia problems Neg Hx     Medications: Patient's Medications  New Prescriptions   No medications on file  Previous Medications   ALBUTEROL (PROAIR HFA) 108 (90 BASE) MCG/ACT INHALER    Inhale 2 puffs into the lungs every 6 (six) hours as needed. For shortness of breath   ALBUTEROL (PROVENTIL) (2.5 MG/3ML) 0.083% NEBULIZER SOLUTION    Take 3 mLs (2.5 mg total) by nebulization 4 (four) times daily as needed. For shortness of breath   ASPIRIN 81 MG CHEWABLE TABLET    Chew 81 mg by mouth daily. Reported on 10/11/2015   ATORVASTATIN (LIPITOR) 40 MG TABLET    Take 40 mg by mouth daily. Reported on 10/11/2015   CHOLECALCIFEROL (VITAMIN D) 1000 UNITS TABLET    Take 1,000 Units by mouth daily.   CLOPIDOGREL (PLAVIX) 75 MG TABLET    Take 75 mg by mouth daily. Reported on 10/11/2015   DEXAMETHASONE (DECADRON) 4 MG TABLET    Take 2 tablets (8 mg total) by mouth daily. Start the day after chemotherapy for 2 days.   DRONABINOL (MARINOL) 2.5 MG CAPSULE    Take 1 capsule (2.5 mg total) by mouth 2 (two) times daily.   FOLIC ACID (FOLVITE) 1 MG TABLET    Take 1 mg by mouth daily. Reported on 10/11/2015   LOSARTAN (COZAAR) 25 MG TABLET    Take 25 mg by mouth daily.   METOCLOPRAMIDE (REGLAN) 10 MG TABLET    Take 1 tablet (10 mg total) by mouth 3 (three) times daily before meals.   METOCLOPRAMIDE (REGLAN) 5 MG TABLET    Take 5 mg by mouth every 6 (six) hours as needed for nausea or vomiting.    METOPROLOL TARTRATE (LOPRESSOR) 25 MG TABLET    Take 25 mg by mouth daily.   NUTRITIONAL SUPPLEMENTS (FEEDING SUPPLEMENT, VITAL 1.5 CAL,) LIQD    Place 1,000 mLs into feeding tube  continuous.   ONDANSETRON (ZOFRAN) 8 MG TABLET    Take 1 tablet (8 mg total) by mouth 2 (two) times daily as needed for refractory nausea / vomiting. Start on day 3 after chemo.   PROCHLORPERAZINE (COMPAZINE) 10 MG TABLET    Take 1 tablet (10 mg total) by mouth every 6 (six) hours as needed (Nausea or vomiting).   SENNA-DOCUSATE (SENNA S) 8.6-50 MG TABLET    Take 2 tablets by mouth 2 (two) times daily.   VITAMIN B-12 (CYANOCOBALAMIN) 1000 MCG TABLET    Take 1,000 mcg by mouth daily.   WATER FOR IRRIGATION, STERILE (FREE WATER) SOLN    Place 1,080 mLs into feeding  tube continuous.   WOUND DRESSINGS (SONAFINE)    Apply 1 application topically 2 (two) times daily.  Modified Medications   No medications on file  Discontinued Medications   No medications on file     Physical Exam: Vitals:   11/22/15 1150  BP: 109/63  Pulse: (!) 107  Resp: 18  Temp: 98 F (36.7 C)  SpO2: 95%  Weight: 145 lb (65.8 kg)  Height: 5\' 5"  (1.651 m)    Physical Exam  Constitutional: He is oriented to person, place, and time. No distress.  HENT:  Head: Normocephalic and atraumatic.  Mouth/Throat: Oropharynx is clear and moist. No oropharyngeal exudate.  Eyes: Conjunctivae and EOM are normal. Pupils are equal, round, and reactive to light.  Neck: Normal range of motion. Neck supple.  Cardiovascular: Normal rate, regular rhythm and normal heart sounds.   Pulmonary/Chest: Effort normal. He has decreased breath sounds.  Abdominal: Soft. Bowel sounds are normal.  PEG  Musculoskeletal: He exhibits no edema or tenderness.  Neurological: He is alert and oriented to person, place, and time.  Skin: Skin is warm and dry. He is not diaphoretic.  Psychiatric: He has a normal mood and affect.    Labs reviewed: Basic Metabolic Panel:  Recent Labs  09/26/15 1025  10/18/15 1514  10/22/15 1514  11/18/15 0500 11/19/15 0500 11/20/15 0500 11/21/15 0420  NA  --   < >  --   < >  --   < > 133* 132*  --  133*  K  --    < >  --   < >  --   < > 3.6 3.9  --  3.4*  CL  --   < >  --   --   --   < > 99* 98*  --  98*  CO2  --   < >  --   < >  --   < > 29 30  --  30  GLUCOSE  --   < >  --   < >  --   < > 128* 87  --  123*  BUN  --   < >  --   < >  --   < > 14 11  --  10  CREATININE  --   < >  --   < >  --   < > 0.67 0.71  --  0.55*  CALCIUM  --   < >  --   < >  --   < > 7.9* 7.9*  --  7.9*  MG  --   < >  --   < >  --   < > 1.3* 1.6* 1.5* 1.6*  PHOS 2.7  --  3.0  --  3.0  --   --   --   --   --   < > = values in this interval not displayed. Liver Function Tests:  Recent Labs  11/05/15 1457 11/13/15 1005 11/14/15 1658  AST 15 14 19   ALT 12 12 15*  ALKPHOS 53 50 43  BILITOT 0.60 0.86 1.0  PROT 6.8 6.3* 6.4*  ALBUMIN 3.5 3.1* 3.6    Recent Labs  11/14/15 1703  LIPASE 30   No results for input(s): AMMONIA in the last 8760 hours. CBC:  Recent Labs  11/16/15 0540 11/17/15 0500 11/18/15 0500 11/19/15 0500 11/21/15 0420  WBC 1.0* 2.0* 3.3* 3.5* 2.8*  NEUTROABS 0.6* 1.4* 2.5  --   --  HGB 9.0* 9.1* 9.4* 9.3* 8.8*  HCT 26.7* 27.1* 27.4* 27.0* 25.2*  MCV 83.2 84.4 83.0 82.8 82.9  PLT 113* 127* 133* 130* 128*   TSH:  Recent Labs  11/18/15 1725  TSH 3.885   A1C: Lab Results  Component Value Date   HGBA1C  08/15/2007    5.0 (NOTE)   The ADA recommends the following therapeutic goals for glycemic   control related to Hgb A1C measurement:   Goal of Therapy:   < 7.0% Hgb A1C   Action Suggested:  > 8.0% Hgb A1C   Ref:  Diabetes Care, 22, Suppl. 1, 1999   Lipid Panel: No results for input(s): CHOL, HDL, LDLCALC, TRIG, CHOLHDL, LDLDIRECT in the last 8760 hours.  Radiological Exams: No results found.   Assessment/Plan 1. Adult failure to thrive Weight loss due to inadequate nutrition and now with increased debility. At [facility] for rehab and nursing support  2. Dehydration Improved with hospitalization, ,cont with free water and tube feeding supplements. Will follow up BMP  3.  Non-intractable vomiting with nausea, unspecified vomiting type Stable at this time, conts reglan and zofran PRN  4. Carcinoma of lower third of esophagus (HCC) Pt has been undergoing chemotherapy and radiation treatments,  last chemo with Taxol and Carboplatin earlier this month- 5th cycle on hold due to FTT and radiation completed during hospitalization. conts to follow up with oncology   5. COPD  GOLD III  Stable, conts on O2, and nebulizers  6. Hypertension Blood pressure soft. Currently on losartan and metoprolol . To hold if sbp <100  -will follow up BMP   7. CAD  without chest pains.  Continue aspirin, plavix, BB, statin for now.  Total time 35 mins,  time greater than 50% of total time spent doing pt coordination of care with pt and staff as well as reviewing hospitalization   Jakyia Gaccione K. Harle Battiest  [facility] [phone] 8 am - 5 pm) [phone] (after hours)

## 2015-11-26 ENCOUNTER — Encounter: Payer: Self-pay | Admitting: Internal Medicine

## 2015-11-26 ENCOUNTER — Non-Acute Institutional Stay (SKILLED_NURSING_FACILITY): Payer: Medicare Other | Admitting: Internal Medicine

## 2015-11-26 DIAGNOSIS — R627 Adult failure to thrive: Secondary | ICD-10-CM

## 2015-11-26 DIAGNOSIS — J9611 Chronic respiratory failure with hypoxia: Secondary | ICD-10-CM

## 2015-11-26 DIAGNOSIS — C155 Malignant neoplasm of lower third of esophagus: Secondary | ICD-10-CM | POA: Diagnosis not present

## 2015-11-26 NOTE — Assessment & Plan Note (Signed)
Chemotherapy on hold until failure to thrive can be addressed

## 2015-11-26 NOTE — Progress Notes (Signed)
Facility Location: [facility]  Room Number: 112-A  Code Status: Full Code  PCP: LITTLE,JAMES, [place] [id number]    This is a comprehensive admission note to [facility] performed on this date less than 30 days from date of admission. Included are preadmission medical/surgical history;reconciled medication list; family history; social history and comprehensive review of systems.  Corrections and additions to the records were documented . Comprehensive physical exam was also performed. Additionally a clinical summary was entered for each active diagnosis pertinent to this admission in the Problem List to enhance continuity of care.   HPI: The patient was hospitalized 8/17-8/24/17 ;referred to the emergency room from oncology for evaluation of weakness and adult failure to thrive. He was status post 4 cycles of carboplatin and Taxol;but deconditioning and debilitation prevented the fifth cycle of the chemotherapy for esophageal cancer. It was diagnosed earlier this year. He was not felt to be a surgical candidate because of his advanced COPD, coronary artery disease, vasculopathy,and pancytopenia due to chemotherapy and radiation. Shortness of breath is chronic related to his end-stage oxygen-dependent COPD. He is on oxygen 24/7. He relates his shortness of breath to prior lung surgery for benign lung mass. He is on 3 L at baseline, 4 L with exertion. He has a PEG tube for primary nutrition and medication administration. This is been associated with recurrent issues of leaking & tube malfunction. Subjectively he's been unable to tolerate recommended tube feeding and free water recommendations due to nausea. Radiation was completed. As noted chemotherapy is on hold due to debilitation. Dysphagia is an ongoing issue associated with regurgitation. He  is felt to have at least mild oral pharyngeal dysphagia without definite aspiration. Due  to tube feeding intolerance, he was changed to Vital. Reglan was added for dysmotility. At discharge feeding was 55 mL per hour and free water was given continuously via pump due to the subjective intolerance with associated nausea and vomiting with bolus water administration. He was to receive 1080 mL of free water daily. He was discharged to skilled nursing for rehabilitation. He does have pancytopenia attributed to chemotherapy. 11/21/15 white count was 2800, hemoglobin 8.8, hematocrit 25.2, and platelet count 128,000. Magnesium had improved 1.6. Albumin was low normal at 3.6. Total protein was minimally reduced at 6.4.  Past medical and surgical history: As noted he has end-stage emphysema which is oxygen dependent. This is in the context of a 2 pack per day smoking history over multiple decades. He has vasculopathy with history of carotid vertebral bypass as well as aorto-femoral-popliteal bypass. Additionally he's had coronary artery bypass grafting.   Social history: Reviewed and discussed above  Family history: Records reviewed  Review of systems: His major symptom is weakness and chronic shortness of breath. He denies any localizing signs or symptoms related to the fever except he has been producing some clear mucus. The major issue is he is refusing medication  administration or nutrition administration through the PEG and wants to take it orally. He was observed to throw up his  Marinol  Physical exam:  Pertinent or positive findings: He is lying in the right lateral decubitus position unable to rise to a sitting position by himself. He appears frail and poorly nourished. Pattern alopecia is present. He has a small mustache. He has complete dentures. Ptosis present on the right. There is slight asymmetry of the pupils with the left minimally larger than the right. He has complete  dentures. The uvula is small. Heart rhythm is irregular with respiratory variation. Heart sounds or  distance except in the lower left parasternal area. Breath sounds are decreased. Intermittent weak,rattly, nonproductive cough present. Bowel sounds decreased without ileus. PEG tube present. Posterior tibial pulses are decreased. Dorsalis pedis is pulses are surprisingly good. Been in the nailbeds is noted. Atrophy of the limbs is present particularly the lower extremities. He has chronic hyperpigmentation of the forearms, left greater than right.  General appearance:in no distress ,no increased work of breathing is present on O2.   Lymphatic: No lymphadenopathy about the head, neck, axilla . Eyes: No conjunctival inflammation or lid edema is present. There is no scleral icterus. Ears:  External ear exam shows no significant lesions or deformities.   Nose:  External nasal examination shows no deformity or inflammation. Nasal mucosa are pink and moist without lesions ,exudates Oral exam: lips and gums are healthy appearing.There is no oropharyngeal erythema or exudate . Neck:  No thyromegaly, masses, tenderness noted.    Heart:  No gallop, murmur, click, rub .  Abdomen: Abdomen is soft and nontender with no organomegaly, hernias,masses. GU: deferred  Extremities:  No cyanosis,edema  Neurologic exam : Balance,Rhomberg,finger to nose testing could not be completed due to clinical state Skin: Warm & dry w/o tenting. No significant lesions or rash.  See clinical summary under each active problem in the Problem List with associated updated therapeutic plan

## 2015-11-26 NOTE — Assessment & Plan Note (Addendum)
11/26/15 24/7 oxygen dependence Clinical suspicion of superimposed aspiration

## 2015-11-26 NOTE — Patient Instructions (Addendum)
Oral medications and feeding will held until he is able to sit up by himself and aspiration ruled out  CBC and differential Amoxicillin 500 mg and metronidazole 500 mg 3 times a day via PEG tube DC Marinol

## 2015-11-26 NOTE — Assessment & Plan Note (Signed)
Continue tube feedings as ordered Oral supplementation will be discussed once he is able to sit up without help

## 2015-12-03 ENCOUNTER — Non-Acute Institutional Stay (SKILLED_NURSING_FACILITY): Payer: Medicare Other | Admitting: Internal Medicine

## 2015-12-03 ENCOUNTER — Encounter: Payer: Self-pay | Admitting: Internal Medicine

## 2015-12-03 DIAGNOSIS — I2510 Atherosclerotic heart disease of native coronary artery without angina pectoris: Secondary | ICD-10-CM

## 2015-12-03 DIAGNOSIS — J449 Chronic obstructive pulmonary disease, unspecified: Secondary | ICD-10-CM

## 2015-12-03 DIAGNOSIS — T451X5A Adverse effect of antineoplastic and immunosuppressive drugs, initial encounter: Secondary | ICD-10-CM

## 2015-12-03 DIAGNOSIS — D61810 Antineoplastic chemotherapy induced pancytopenia: Secondary | ICD-10-CM | POA: Diagnosis not present

## 2015-12-03 DIAGNOSIS — R627 Adult failure to thrive: Secondary | ICD-10-CM | POA: Diagnosis not present

## 2015-12-03 DIAGNOSIS — C155 Malignant neoplasm of lower third of esophagus: Secondary | ICD-10-CM | POA: Diagnosis not present

## 2015-12-03 NOTE — Assessment & Plan Note (Signed)
He was asked to continue his Plavix, aspirin, losartan, metoprolol & Lipitor Follow-up with Dr. Einar Gip, Cardiology is pending

## 2015-12-03 NOTE — Assessment & Plan Note (Signed)
He is to see Dr. Lisbeth Renshaw, Radiation Oncology, later this month. He was asked to see Dr.Kale,Oncologist concerning completing the chemotherapy

## 2015-12-03 NOTE — Progress Notes (Signed)
Facility Location:[facility]  Room Number: E9319001  Code Status: Full Code    The patient is being discharged from [name] on this date by [name] MD.  PCP: [name] M.D., [place], [place]  The medical history in this facility was reviewed and summarized and medical problem list was updated.   Summary of [name] medical records: The patient was admitted 11/21/15 after a seven-day hospitalization for profound weakness and failure to thrive in context of chemotherapy with carboplatin and Taxol.The last cycle could not be completed because of his clinical state. He was to receive a total of 5 cycles for esophageal cancer diagnosed earlier this year. Surgery was not felt to be an option because of his advanced ,oxygen dependent COPD in context of 2 packs a day for multiple decades.He is on oxygen 24 hours 7 days a week at 3 L at rest and 4 L with exertion.  He attributed his shortness of breath to prior lung surgery for a benign lung mass.  He also has coronary artery disease, vasculopathy, and recent pancytopenia related to chemotherapy and radiation. PEG tube was placed for nutrition and administration of medicines;but  subjectively he's been unable to tolerate the recommended tube feeding and free water supplementation as recommended. PEG tube administration was felt necessary because of recurrent, nausea, dysphagia and regurgitation with aspiration risk. At admission he was profoundly weak, unable to sit up w/o assistance. Despite this he initially insisted on interrupting the tube feedings and attempting to take oral nutrition. The risk of aspiration was discussed frankly with him. He was told that oral feedings could be considered once he was able to sit up with meals. On 8/24 white count was 2800, hemoglobin 8.8, hematocrit 25.2 and platelet count 128,000.  Review of systems: He states he continues to have some  nausea when certain medications are administered via the pain. He denies any new or progressive cardiopulmonary or GI symptoms except for intermittent production of "slime" with cough.  Cardiovascular: No chest pain, palpitations,paroxysmal nocturnal dyspnea, claudication, edema  Respiratory: No hemoptysis,  significant snoring,apnea  Gastrointestinal: No heartburn,abdominal pain,rectal bleeding, melena,change in bowels Genitourinary: No dysuria,hematuria, pyuria,  incontinence, nocturia Musculoskeletal: No joint stiffness, joint swelling, weakness,pain Dermatologic: No rash, pruritus, change in appearance of skin Neurologic: No dizziness,headache,syncope, seizures, numbness , tingling Psychiatric: No significant anxiety , depression, insomnia, anorexia Endocrine: No change in hair/skin/ nails, excessive thirst, excessive hunger, excessive urination  Hematologic/lymphatic: No significant lymphadenopathy,abnormal bleeding Allergy/immunology: No itchy/ watery eyes, significant sneezing, urticaria, angioedema  Physical exam:  Pertinent or positive findings: He is thin and suboptimally nourished;in  no acute distress , increased work of breathing is present.  Nasal oxygen in place. Pattern alopecia is present. He has complete dentures. Breath sounds are decreased diffusely. Heart sounds are distant and irregular, heard best in the epigastrium. PEG tube present. Pedal pulses are decreased. Limbs are atrophic. He has irregular bruising of the left forearm.  Lymphatic: No lymphadenopathy about the head, neck, axilla . Eyes: No conjunctival inflammation or lid edema is present. There is no scleral icterus. Ears:  External ear exam shows no significant lesions or deformities.   Nose:  External nasal examination shows no deformity or inflammation. Nasal mucosa are pink and moist without lesions ,exudates Oral exam: lips and gums are healthy appearing.There is no oropharyngeal erythema or exudate  . Neck:  No thyromegaly, masses, tenderness noted.    Heart:  No gallop, murmur, click, rub .  Lungs:Chest clear to auscultation without wheezes, rhonchi,rales , rubs. Abdomen:Bowel sounds are normal. Abdomen is soft and nontender with no organomegaly, hernias,masses. GU: deferred as previously addressed. Extremities:  No cyanosis, clubbing,edema  Psychiatric: He is alert and oriented. He categorically states he needs no prescriptions refilled as he has been in the hospital and SNF for 3 weeks. His son endorses the fact that he has antinausea medication. He states he plans to take nutrition and medications orally. He declined any equipment except for a lightweight wheelchair. He states he has several cases of nutritional supplement. He stated he would not use the pump  Neurologic exam : Strength equal  in upper & lower extremities but generally decreased. He was able to sit up without help. Balance,Rhomberg,finger to nose testing could not be completed due to clinical state Deep tendon reflexes are equal Skin: Warm & dry w/o tenting. No significant lesions or rash.  See clinical summary of Discharge Diagnoses in the Problem List with associated updated therapeutic plan  Discharge instructions were written and discharge instructions provided. Follow-up will be by Dr Lisbeth Renshaw and Dr Einar Gip this month.

## 2015-12-03 NOTE — Assessment & Plan Note (Signed)
As per Dr. Lucilla Edin

## 2015-12-03 NOTE — Patient Instructions (Signed)
Keep scheduled appointments with Dr.Ganji and Dr Lisbeth Renshaw. Please call Dr.Kale's office to set up follow-up appointment. They have left a voicemail for you at home.

## 2015-12-03 NOTE — Assessment & Plan Note (Signed)
Follow-up with Dr. Melvyn Novas as needed, COPD is clinically stable at this time

## 2015-12-03 NOTE — Assessment & Plan Note (Signed)
12/03/15 clinically much stronger, able to sit up without help. Caution discussed when attempting oral nutrition at home

## 2015-12-05 ENCOUNTER — Other Ambulatory Visit: Payer: Medicare Other

## 2015-12-05 ENCOUNTER — Encounter (HOSPITAL_COMMUNITY): Payer: Self-pay | Admitting: *Deleted

## 2015-12-05 ENCOUNTER — Ambulatory Visit: Payer: Medicare Other | Admitting: Hematology

## 2015-12-05 NOTE — Progress Notes (Signed)
RN spoke to patient's son about missed appointment. Son states that patient is hard to move right now and is in "bad shape". Son states that RN should call back next Tuesday to reschedule.

## 2015-12-06 NOTE — Progress Notes (Signed)
  Radiation Oncology         [phone] ________________________________  Name: [name] MRN: [id number]  Date: 11/21/2015  DOB: 1931-12-16  End of Treatment Note  Diagnosis:   Esophageal cancer     Indication for treatment::  curative       Radiation treatment dates:   10/09/2015 through 11/21/2015  Site/dose:   The patient was treated with a simultaneous integrated boost IMRT technique. The patient was treated to a dose of 50 gray to the gross tumor initially in 25 fractions, followed by a 10 gray boost in 5 fractions. The surrounding region simultaneously received 45 gray and 9 gray boost respectively. The patient's total dose was 60 gray to the gross tumor and 54 gray to the at risk continuous region.  Narrative: The patient tolerated radiation treatment and he was able to complete the prescribed course of treatment. The patient's status did decline some during treatment secondary to decreased nutritional intake.  Plan: The patient has completed radiation treatment. The patient will return to radiation oncology clinic for routine followup in one month. I advised the patient to call or return sooner if they have any questions or concerns related to their recovery or treatment. ________________________________  Jodelle Gross, M.D., Ph.D.

## 2015-12-06 NOTE — Progress Notes (Signed)
  Radiation Oncology         [phone] ________________________________  Name: [name] MRN: [id number]  Date: 11/07/2015  DOB: [birth year]  COMPLEX SIMULATION  NOTE  Diagnosis: Esophageal cancer  Narrative The patient has initially been planned to receive a course of radiation treatment to a dose of 50 gray in 25 fractions at 2 gray per fraction to the high dose region using a simultaneous integrated boost technique with 45 gray concurrently delivered to the lower risk region. The patient will now receive a boost to the high risk target volume for an additional 10 gray to the gross tumor and 9 Gray to the surrounding higher risk region. This will be delivered in 5 fractions at 2 gray per fraction and a cone down boost technique will be utilized. To accomplish this, an IMRT technique will be medically necessary. This is required to treat the gross tumor volume concurrently with a lower dose to the surrounding at risk region while continuing to adequately spare adjacent critical normal structures. The patient's final total dose therefore will be 60 gray to the gross tumor and 54 gray to the continuous at risk region.   ________________________________ ------------------------------------------------  Jodelle Gross, MD, PhD

## 2015-12-07 ENCOUNTER — Encounter (HOSPITAL_COMMUNITY): Payer: Self-pay

## 2015-12-07 ENCOUNTER — Emergency Department (HOSPITAL_COMMUNITY)
Admission: EM | Admit: 2015-12-07 | Discharge: 2015-12-07 | Disposition: A | Discharge status: Home / Self Care | Payer: Medicare Other | Attending: Emergency Medicine | Admitting: Emergency Medicine

## 2015-12-07 DIAGNOSIS — J449 Chronic obstructive pulmonary disease, unspecified: Secondary | ICD-10-CM | POA: Diagnosis not present

## 2015-12-07 DIAGNOSIS — Z79899 Other long term (current) drug therapy: Secondary | ICD-10-CM | POA: Diagnosis not present

## 2015-12-07 DIAGNOSIS — Z7982 Long term (current) use of aspirin: Secondary | ICD-10-CM | POA: Insufficient documentation

## 2015-12-07 DIAGNOSIS — R339 Retention of urine, unspecified: Secondary | ICD-10-CM | POA: Diagnosis not present

## 2015-12-07 DIAGNOSIS — I509 Heart failure, unspecified: Secondary | ICD-10-CM | POA: Insufficient documentation

## 2015-12-07 DIAGNOSIS — R7989 Other specified abnormal findings of blood chemistry: Secondary | ICD-10-CM | POA: Diagnosis not present

## 2015-12-07 DIAGNOSIS — I110 Hypertensive heart disease with heart failure: Secondary | ICD-10-CM | POA: Insufficient documentation

## 2015-12-07 DIAGNOSIS — I2510 Atherosclerotic heart disease of native coronary artery without angina pectoris: Secondary | ICD-10-CM | POA: Insufficient documentation

## 2015-12-07 DIAGNOSIS — I252 Old myocardial infarction: Secondary | ICD-10-CM | POA: Insufficient documentation

## 2015-12-07 LAB — CBC WITH DIFFERENTIAL/PLATELET
Basophils Absolute: 0 10*3/uL (ref 0.0–0.1)
Basophils Relative: 1 %
Eosinophils Absolute: 0 10*3/uL (ref 0.0–0.7)
Eosinophils Relative: 0 %
HEMATOCRIT: 36.5 % — AB (ref 39.0–52.0)
HEMOGLOBIN: 12.2 g/dL — AB (ref 13.0–17.0)
LYMPHS ABS: 1.9 10*3/uL (ref 0.7–4.0)
LYMPHS PCT: 28 %
MCH: 28.8 pg (ref 26.0–34.0)
MCHC: 33.4 g/dL (ref 30.0–36.0)
MCV: 86.1 fL (ref 78.0–100.0)
MONOS PCT: 11 %
Monocytes Absolute: 0.7 10*3/uL (ref 0.1–1.0)
NEUTROS ABS: 4 10*3/uL (ref 1.7–7.7)
NEUTROS PCT: 60 %
Platelets: 300 10*3/uL (ref 150–400)
RBC: 4.24 MIL/uL (ref 4.22–5.81)
RDW: 17.3 % — ABNORMAL HIGH (ref 11.5–15.5)
WBC: 6.6 10*3/uL (ref 4.0–10.5)

## 2015-12-07 LAB — URINALYSIS, ROUTINE W REFLEX MICROSCOPIC
BILIRUBIN URINE: NEGATIVE
Glucose, UA: NEGATIVE mg/dL
HGB URINE DIPSTICK: NEGATIVE
Ketones, ur: NEGATIVE mg/dL
Leukocytes, UA: NEGATIVE
NITRITE: NEGATIVE
PROTEIN: NEGATIVE mg/dL
Specific Gravity, Urine: 1.021 (ref 1.005–1.030)
pH: 5.5 (ref 5.0–8.0)

## 2015-12-07 LAB — COMPREHENSIVE METABOLIC PANEL
ALBUMIN: 3.4 g/dL — AB (ref 3.5–5.0)
ALT: 23 U/L (ref 17–63)
ANION GAP: 9 (ref 5–15)
AST: 29 U/L (ref 15–41)
Alkaline Phosphatase: 57 U/L (ref 38–126)
BUN: 31 mg/dL — ABNORMAL HIGH (ref 6–20)
CHLORIDE: 104 mmol/L (ref 101–111)
CO2: 29 mmol/L (ref 22–32)
Calcium: 9.5 mg/dL (ref 8.9–10.3)
Creatinine, Ser: 1.44 mg/dL — ABNORMAL HIGH (ref 0.61–1.24)
GFR calc non Af Amer: 44 mL/min — ABNORMAL LOW (ref >60)
GFR, EST AFRICAN AMERICAN: 51 mL/min — AB (ref >60)
Glucose, Bld: 111 mg/dL — ABNORMAL HIGH (ref 65–99)
Potassium: 4.4 mmol/L (ref 3.5–5.1)
SODIUM: 142 mmol/L (ref 135–145)
Total Bilirubin: 0.9 mg/dL (ref 0.3–1.2)
Total Protein: 7 g/dL (ref 6.5–8.1)

## 2015-12-07 MED ORDER — SODIUM CHLORIDE 0.9 % IV BOLUS (SEPSIS): 1000.0000 mL | Freq: Once | INTRAVENOUS | Status: DC

## 2015-12-07 NOTE — ED Provider Notes (Signed)
[facility] DEPT Provider Note   CSN: [id number] Arrival date & time: 12/07/15  1247     History   Chief Complaint Chief Complaint  Patient presents with  . Urinary Retention    HPI [name] is a 82 y.o. male.  [name] is a 82 y.o. male who presents to the ED complaining of difficulty urinating since last night. He reports difficulty in initiating a stream of urine last night and pain when urinating. He reports since last night he's had very little urine. He reports he does have the urge to urinate and pain when he palpates in his lower abdomen. He reports otherwise he is feeling "really good." He has a history of an esophageal carcinoma and has previously had surgery many years ago. He just completed his last radiation treatment about a month ago. He is due to follow-up with oncology soon. He has a PEG tube for feedings. He is on oxygen 24/7 for COPD. He reports his bowel movements are typically loose due to tube feeding. The patient denies fevers, vomiting, history of urinary obstruction, increasing cough or shortness of breath, or rashes.    The history is provided by the patient and medical records. No language interpreter was used.    Past Medical History:  Diagnosis Date  . Anemia   . CAD (coronary artery disease)    Dr Einar Gip every 6 months  . Cardiomyopathy    EF 25% by echo 2009  . Carotid artery occlusion   . COPD (chronic obstructive pulmonary disease) ([place])    on O2 constantly  . DVT (deep venous thrombosis) ([place])   . Emphysema   . Esophageal cancer ([place])    'causes chest discomfort, liquids and solids"  . History of oxygen administration    Oxygen 3 l/m at rest / 4 l/m when active. 24/7  . Hypertension   . Imbalance   . Myocardial infarction ([place]) 1986  . Peripheral vascular disease ([place])   . Pulmonary nodule    s/p wedge resction, inflammatory, +MAC  . Vitamin B 12 deficiency     Patient Active Problem List   Diagnosis Date Noted  . Nausea  with vomiting 11/14/2015  . PEG tube malfunction ([place]) 11/14/2015  . Adult failure to thrive 11/14/2015  . Antineoplastic chemotherapy induced pancytopenia ([place]) 11/14/2015  . Hypomagnesemia 11/14/2015  . CHF (congestive heart failure) ([place]) 11/14/2015  . CAD (coronary artery disease) 11/14/2015  . Acid reflux 11/14/2015  . Severe protein-calorie malnutrition ([place]) 10/09/2015  . Unintentional weight loss 10/04/2015  . Dehydration 10/04/2015  . Carcinoma of lower third of esophagus ([place]) 09/23/2015  . Occlusion and stenosis of carotid artery without mention of cerebral infarction 09/07/2011  . Chronic respiratory failure with hypoxia ([place]) 10/21/2007  . COPD  GOLD III  03/16/2007  . PULMONARY NODULE 03/16/2007    Past Surgical History:  Procedure Laterality Date  . ARCH AORTOGRAM N/A 07/21/2011   Procedure: ARCH AORTOGRAM;  Surgeon: Laverda Page, MD;  Location: [facility] CATH LAB;  Service: Cardiovascular;  Laterality: N/A;  . CAROTID ENDARTERECTOMY  2009   triple bypass  . CAROTID-VERTEBRAL BYPASS GRAFT  08/20/2011   Procedure: BYPASS GRAFT CAROTID-VERTEBRAL;  Surgeon: Serafina Mitchell, MD;  [facility] OR;  Service: Vascular;  Laterality: Right;  Right CAROTID-VERTEBRAL TRANSPOSITION  . CORONARY ARTERY BYPASS GRAFT  2009  . ESOPHAGOGASTRODUODENOSCOPY (EGD) WITH PROPOFOL N/A 09/19/2015   Procedure: ESOPHAGOGASTRODUODENOSCOPY (EGD) WITH PROPOFOL;  Surgeon: Clarene Essex, MD;  Location: [facility] ENDOSCOPY;  Service: Endoscopy;  Laterality: N/A;  . IR GENERIC HISTORICAL  11/14/2015   IR CM INJ ANY COLONIC TUBE W/FLUORO 11/14/2015 Darrell K Allred, PA-C [facility]-INTERV RAD  . LUNG SURGERY  2008   tumor"infection"  . PR VEIN BYPASS GRAFT,AORTO-FEM-POP    . SAVORY DILATION N/A 09/19/2015   Procedure: SAVORY DILATION;  Surgeon: Clarene Essex, MD;  Location: [facility] ENDOSCOPY;  Service: Endoscopy;  Laterality: N/A;  needs carm  . VASCULAR SURGERY         Home Medications    Prior to Admission medications     Medication Sig Start Date End Date Taking? Authorizing Provider  albuterol (PROVENTIL) (2.5 MG/3ML) 0.083% nebulizer solution Take 3 mLs (2.5 mg total) by nebulization 4 (four) times daily as needed. For shortness of breath 04/05/15  Yes Tanda Rockers, MD  aspirin 81 MG chewable tablet Chew 81 mg by mouth daily. Reported on 10/11/2015   Yes Historical Provider, MD  atorvastatin (LIPITOR) 40 MG tablet Take 40 mg by mouth daily. Reported on 10/11/2015   Yes Historical Provider, MD  cholecalciferol (VITAMIN D) 1000 units tablet Take 1,000 Units by mouth daily.   Yes Historical Provider, MD  clopidogrel (PLAVIX) 75 MG tablet Take 75 mg by mouth daily. Reported on 10/11/2015   Yes Historical Provider, MD  losartan (COZAAR) 25 MG tablet Take 25 mg by mouth daily.   Yes Historical Provider, MD  metoCLOPramide (REGLAN) 10 MG tablet Take 1 tablet (10 mg total) by mouth 3 (three) times daily before meals. 10/30/15  Yes Brunetta Genera, MD  metoprolol tartrate (LOPRESSOR) 25 MG tablet Take 25 mg by mouth daily. Hold if SBP < 100 08/26/15  Yes Historical Provider, MD  Nutritional Supplements (ISOSOURCE 1.5 CAL) LIQD Take 1 Can by mouth 5 (five) times daily. Continue flushes as needed   Yes Historical Provider, MD  ondansetron (ZOFRAN) 8 MG tablet Take 1 tablet (8 mg total) by mouth 2 (two) times daily as needed for refractory nausea / vomiting. Start on day 3 after chemo. 10/03/15  Yes Brunetta Genera, MD  prochlorperazine (COMPAZINE) 10 MG tablet Take 1 tablet (10 mg total) by mouth every 6 (six) hours as needed (Nausea or vomiting). 10/03/15  Yes Brunetta Genera, MD  vitamin B-12 (CYANOCOBALAMIN) 1000 MCG tablet Take 1,000 mcg by mouth daily.   Yes Historical Provider, MD  OXYGEN Inhale 3 mLs into the lungs. For Hypoxia    Historical Provider, MD  Water For Irrigation, Sterile (FREE WATER) SOLN Place 1,080 mLs into feeding tube continuous. 11/21/15   Debbe Odea, MD  Wound Dressings (SONAFINE) Apply 1  application topically 2 (two) times daily. 10/09/15   Kyung Rudd, MD    Family History Family History  Problem Relation Age of Onset  . Heart disease Father   . Diabetes Sister   . Anesthesia problems Neg Hx     Social History Social History  Substance Use Topics  . Smoking status: Former Smoker    Packs/day: 2.00    Years: 56.00    Types: Cigarettes    Quit date: 03/30/2006  . Smokeless tobacco: Never Used  . Alcohol use No     Comment: none since July 2017, wine caused heartburn     Allergies   Silver; Symbicort [budesonide-formoterol fumarate]; and Tetanus toxoids   Review of Systems Review of Systems  Constitutional: Negative for chills and fever.  HENT: Negative for congestion and sore throat.   Respiratory: Negative for cough and shortness of breath.   Cardiovascular:  Negative for chest pain.  Gastrointestinal: Positive for abdominal pain. Negative for diarrhea, nausea and vomiting.  Genitourinary: Positive for difficulty urinating and dysuria. Negative for flank pain, penile pain, penile swelling and scrotal swelling.  Musculoskeletal: Negative for back pain and neck pain.  Skin: Negative for rash.  Neurological: Negative for headaches.     Physical Exam Updated Vital Signs BP 116/90   Pulse 92   Temp (!) 96.6 F (35.9 C) (Axillary)   Resp 24   SpO2 95%   Physical Exam  Constitutional: He appears well-developed and well-nourished. No distress.  HENT:  Head: Normocephalic and atraumatic.  Mouth/Throat: Oropharynx is clear and moist.  Eyes: Conjunctivae are normal. Pupils are equal, round, and reactive to light. Right eye exhibits no discharge. Left eye exhibits no discharge.  Neck: Neck supple.  Cardiovascular: Normal rate, regular rhythm, normal heart sounds and intact distal pulses.  Exam reveals no gallop and no friction rub.   No murmur heard. Pulmonary/Chest: Effort normal and breath sounds normal. No respiratory distress. He has no wheezes. He has  no rales.  Abdominal: Soft. There is tenderness.  Abdomen soft. Patient has suprapubic tenderness to palpation. Bladder is palpable.  Genitourinary: Penis normal.  Genitourinary Comments: No GU rashes. Penis is nontender to palpation.  Musculoskeletal: He exhibits no edema.  Lymphadenopathy:    He has no cervical adenopathy.  Neurological: He is alert. Coordination normal.  Skin: Skin is warm and dry. Capillary refill takes less than 2 seconds. No rash noted. He is not diaphoretic. No erythema. No pallor.  Psychiatric: He has a normal mood and affect. His behavior is normal.  Nursing note and vitals reviewed.    ED Treatments / Results  Labs (all labs ordered are listed, but only abnormal results are displayed) Labs Reviewed  URINALYSIS, ROUTINE W REFLEX MICROSCOPIC (NOT AT [facility]) - Abnormal; Notable for the following:       Result Value   Color, Urine AMBER (*)    APPearance CLOUDY (*)    All other components within normal limits  COMPREHENSIVE METABOLIC PANEL - Abnormal; Notable for the following:    Glucose, Bld 111 (*)    BUN 31 (*)    Creatinine, Ser 1.44 (*)    Albumin 3.4 (*)    GFR calc non Af Amer 44 (*)    GFR calc Af Amer 51 (*)    All other components within normal limits  CBC WITH DIFFERENTIAL/PLATELET - Abnormal; Notable for the following:    Hemoglobin 12.2 (*)    HCT 36.5 (*)    RDW 17.3 (*)    All other components within normal limits  URINE CULTURE    EKG  EKG Interpretation None       Radiology No results found.  Procedures Procedures (including critical care time)  Medications Ordered in ED Medications - No data to display   Initial Impression / Assessment and Plan / ED Course  I have reviewed the triage vital signs and the nursing notes.  Pertinent labs & imaging results that were available during my care of the patient were reviewed by me and considered in my medical decision making (see chart for details).  Clinical Course    Patient presented complaining of urinary retention, and dysuria since last night. No history of urinary retention. No fevers. On exam patient is afebrile nontoxic appearing. His abdomen is soft and he has suprapubic tenderness to palpation with palpable bladder. Bladder scan shows more than 500 mL's of urine in  his bladder. Catheter was placed with relief of his pain.  Urinalysis shows no sign of infection. Normal white count on CBC. Creatinine is elevated on CMP with a creatinine of 1.44. This is an increase from his baseline. Likely due to the patient's inability to urinate since last night. Patient receives PEG tube feeding. No vomiting. Will send home with urinary catheter and follow-up with urologist Dr. Diona Fanti and his PCP.  I discussed catheter care and return precautions. I advised the patient to follow-up with their primary care provider this week. I advised the patient to return to the emergency department with new or worsening symptoms or new concerns. The patient and his friend who cares for him verbalized understanding and agreement with plan.    This patient was discussed with and evaluated by Dr. Maryan Rued who agrees with assessment and plan.   Final Clinical Impressions(s) / ED Diagnoses   Final diagnoses:  Urinary retention  Elevated serum creatinine    New Prescriptions New Prescriptions   No medications on file     Waynetta Pean, PA-C 12/07/15 [place], MD 12/07/15 2032

## 2015-12-07 NOTE — Discharge Instructions (Signed)
Please have your kidney function rechecked by your doctor. Your creatinine was 1.44 today.

## 2015-12-07 NOTE — ED Notes (Signed)
PA at bedside.

## 2015-12-07 NOTE — ED Triage Notes (Signed)
Pt has had urinary retention since last night.  Pt states cannot urinate and painful.  Pt is a cancer patient.  Poor circulation to hands.  Mouth dry.  Unable to get temp and pulse ox reading. Low bp. Will finish assessment in medical room. Pt is alert and oriented.

## 2015-12-08 LAB — URINE CULTURE: CULTURE: NO GROWTH

## 2015-12-13 ENCOUNTER — Telehealth: Payer: Self-pay

## 2015-12-13 NOTE — Telephone Encounter (Signed)
Dorian Pod a friend taking care of pt called. Pt is in exruciating pain, he had a foley placed in the hospital. It was draining until yesterday evening. He feels like he has to go and nothing comes out. S/w Selena Lesser NP and called Ivin Booty back. Advised pt to go to ER.

## 2015-12-24 ENCOUNTER — Ambulatory Visit: Payer: Medicare Other | Admitting: Internal Medicine

## 2015-12-26 ENCOUNTER — Ambulatory Visit: Payer: Medicare Other | Admitting: Radiation Oncology

## 2015-12-30 ENCOUNTER — Observation Stay (HOSPITAL_COMMUNITY)
Admission: EM | Admit: 2015-12-30 | Discharge: 2016-01-29 | Disposition: E | Payer: Medicare Other | Attending: Internal Medicine | Admitting: Internal Medicine

## 2015-12-30 ENCOUNTER — Emergency Department (HOSPITAL_COMMUNITY): Payer: Medicare Other

## 2015-12-30 ENCOUNTER — Encounter (HOSPITAL_COMMUNITY): Payer: Self-pay | Admitting: Emergency Medicine

## 2015-12-30 DIAGNOSIS — Z9981 Dependence on supplemental oxygen: Secondary | ICD-10-CM | POA: Diagnosis not present

## 2015-12-30 DIAGNOSIS — C159 Malignant neoplasm of esophagus, unspecified: Secondary | ICD-10-CM

## 2015-12-30 DIAGNOSIS — Z888 Allergy status to other drugs, medicaments and biological substances status: Secondary | ICD-10-CM

## 2015-12-30 DIAGNOSIS — Z7982 Long term (current) use of aspirin: Secondary | ICD-10-CM | POA: Insufficient documentation

## 2015-12-30 DIAGNOSIS — J449 Chronic obstructive pulmonary disease, unspecified: Secondary | ICD-10-CM | POA: Diagnosis present

## 2015-12-30 DIAGNOSIS — K9423 Gastrostomy malfunction: Secondary | ICD-10-CM | POA: Insufficient documentation

## 2015-12-30 DIAGNOSIS — Z9689 Presence of other specified functional implants: Secondary | ICD-10-CM

## 2015-12-30 DIAGNOSIS — R4182 Altered mental status, unspecified: Secondary | ICD-10-CM | POA: Diagnosis not present

## 2015-12-30 DIAGNOSIS — Z86718 Personal history of other venous thrombosis and embolism: Secondary | ICD-10-CM | POA: Insufficient documentation

## 2015-12-30 DIAGNOSIS — I110 Hypertensive heart disease with heart failure: Secondary | ICD-10-CM | POA: Diagnosis not present

## 2015-12-30 DIAGNOSIS — R042 Hemoptysis: Secondary | ICD-10-CM | POA: Diagnosis not present

## 2015-12-30 DIAGNOSIS — R05 Cough: Secondary | ICD-10-CM | POA: Diagnosis present

## 2015-12-30 DIAGNOSIS — I2510 Atherosclerotic heart disease of native coronary artery without angina pectoris: Secondary | ICD-10-CM | POA: Diagnosis not present

## 2015-12-30 DIAGNOSIS — Z993 Dependence on wheelchair: Secondary | ICD-10-CM | POA: Diagnosis not present

## 2015-12-30 DIAGNOSIS — I739 Peripheral vascular disease, unspecified: Secondary | ICD-10-CM | POA: Diagnosis not present

## 2015-12-30 DIAGNOSIS — Z66 Do not resuscitate: Secondary | ICD-10-CM | POA: Diagnosis not present

## 2015-12-30 DIAGNOSIS — R571 Hypovolemic shock: Secondary | ICD-10-CM

## 2015-12-30 DIAGNOSIS — J439 Emphysema, unspecified: Secondary | ICD-10-CM | POA: Diagnosis not present

## 2015-12-30 DIAGNOSIS — I959 Hypotension, unspecified: Secondary | ICD-10-CM | POA: Diagnosis not present

## 2015-12-30 DIAGNOSIS — C155 Malignant neoplasm of lower third of esophagus: Secondary | ICD-10-CM | POA: Diagnosis not present

## 2015-12-30 DIAGNOSIS — J9621 Acute and chronic respiratory failure with hypoxia: Secondary | ICD-10-CM | POA: Insufficient documentation

## 2015-12-30 DIAGNOSIS — R531 Weakness: Secondary | ICD-10-CM | POA: Diagnosis not present

## 2015-12-30 DIAGNOSIS — Z951 Presence of aortocoronary bypass graft: Secondary | ICD-10-CM | POA: Diagnosis not present

## 2015-12-30 DIAGNOSIS — Z7902 Long term (current) use of antithrombotics/antiplatelets: Secondary | ICD-10-CM | POA: Diagnosis not present

## 2015-12-30 DIAGNOSIS — R627 Adult failure to thrive: Secondary | ICD-10-CM | POA: Insufficient documentation

## 2015-12-30 DIAGNOSIS — D61810 Antineoplastic chemotherapy induced pancytopenia: Secondary | ICD-10-CM | POA: Insufficient documentation

## 2015-12-30 DIAGNOSIS — Z79899 Other long term (current) drug therapy: Secondary | ICD-10-CM | POA: Insufficient documentation

## 2015-12-30 DIAGNOSIS — I252 Old myocardial infarction: Secondary | ICD-10-CM | POA: Diagnosis not present

## 2015-12-30 DIAGNOSIS — I429 Cardiomyopathy, unspecified: Secondary | ICD-10-CM | POA: Diagnosis not present

## 2015-12-30 DIAGNOSIS — Z515 Encounter for palliative care: Secondary | ICD-10-CM | POA: Diagnosis not present

## 2015-12-30 DIAGNOSIS — I5032 Chronic diastolic (congestive) heart failure: Secondary | ICD-10-CM | POA: Diagnosis not present

## 2015-12-30 DIAGNOSIS — Z887 Allergy status to serum and vaccine status: Secondary | ICD-10-CM

## 2015-12-30 DIAGNOSIS — Z87891 Personal history of nicotine dependence: Secondary | ICD-10-CM

## 2015-12-30 DIAGNOSIS — T451X5A Adverse effect of antineoplastic and immunosuppressive drugs, initial encounter: Secondary | ICD-10-CM

## 2015-12-30 DIAGNOSIS — E861 Hypovolemia: Secondary | ICD-10-CM | POA: Diagnosis not present

## 2015-12-30 LAB — COMPREHENSIVE METABOLIC PANEL
ALBUMIN: 2.6 g/dL — AB (ref 3.5–5.0)
ALT: 25 U/L (ref 17–63)
ANION GAP: 21 — AB (ref 5–15)
AST: 48 U/L — ABNORMAL HIGH (ref 15–41)
Alkaline Phosphatase: 63 U/L (ref 38–126)
BUN: 34 mg/dL — ABNORMAL HIGH (ref 6–20)
CALCIUM: 9 mg/dL (ref 8.9–10.3)
CHLORIDE: 103 mmol/L (ref 101–111)
CO2: 16 mmol/L — AB (ref 22–32)
Creatinine, Ser: 1.8 mg/dL — ABNORMAL HIGH (ref 0.61–1.24)
GFR calc non Af Amer: 33 mL/min — ABNORMAL LOW (ref >60)
GFR, EST AFRICAN AMERICAN: 39 mL/min — AB (ref >60)
GLUCOSE: 116 mg/dL — AB (ref 65–99)
POTASSIUM: 4.3 mmol/L (ref 3.5–5.1)
SODIUM: 140 mmol/L (ref 135–145)
Total Bilirubin: 1.1 mg/dL (ref 0.3–1.2)
Total Protein: 6.1 g/dL — ABNORMAL LOW (ref 6.5–8.1)

## 2015-12-30 LAB — CBC WITH DIFFERENTIAL/PLATELET
BASOS PCT: 0 %
Basophils Absolute: 0 10*3/uL (ref 0.0–0.1)
EOS ABS: 0.1 10*3/uL (ref 0.0–0.7)
EOS PCT: 1 %
HCT: 28.5 % — ABNORMAL LOW (ref 39.0–52.0)
HEMOGLOBIN: 8.7 g/dL — AB (ref 13.0–17.0)
LYMPHS ABS: 4.3 10*3/uL — AB (ref 0.7–4.0)
Lymphocytes Relative: 41 %
MCH: 28.9 pg (ref 26.0–34.0)
MCHC: 30.5 g/dL (ref 30.0–36.0)
MCV: 94.7 fL (ref 78.0–100.0)
MONOS PCT: 5 %
Monocytes Absolute: 0.5 10*3/uL (ref 0.1–1.0)
NEUTROS PCT: 53 %
Neutro Abs: 5.8 10*3/uL (ref 1.7–7.7)
PLATELETS: 264 10*3/uL (ref 150–400)
RBC: 3.01 MIL/uL — ABNORMAL LOW (ref 4.22–5.81)
RDW: 17.7 % — AB (ref 11.5–15.5)
WBC: 10.7 10*3/uL — ABNORMAL HIGH (ref 4.0–10.5)

## 2015-12-30 LAB — I-STAT ARTERIAL BLOOD GAS, ED
ACID-BASE DEFICIT: 11 mmol/L — AB (ref 0.0–2.0)
Bicarbonate: 14 mmol/L — ABNORMAL LOW (ref 20.0–28.0)
O2 Saturation: 100 %
TCO2: 15 mmol/L (ref 0–100)
pCO2 arterial: 26.3 mmHg — ABNORMAL LOW (ref 32.0–48.0)
pH, Arterial: 7.327 — ABNORMAL LOW (ref 7.350–7.450)
pO2, Arterial: 250 mmHg — ABNORMAL HIGH (ref 83.0–108.0)

## 2015-12-30 LAB — I-STAT CHEM 8, ED
BUN: 34 mg/dL — AB (ref 6–20)
CALCIUM ION: 0.99 mmol/L — AB (ref 1.15–1.40)
CHLORIDE: 105 mmol/L (ref 101–111)
Creatinine, Ser: 1.7 mg/dL — ABNORMAL HIGH (ref 0.61–1.24)
GLUCOSE: 113 mg/dL — AB (ref 65–99)
HCT: 25 % — ABNORMAL LOW (ref 39.0–52.0)
Hemoglobin: 8.5 g/dL — ABNORMAL LOW (ref 13.0–17.0)
Potassium: 4.2 mmol/L (ref 3.5–5.1)
SODIUM: 139 mmol/L (ref 135–145)
TCO2: 18 mmol/L (ref 0–100)

## 2015-12-30 LAB — TYPE AND SCREEN
ABO/RH(D): B POS
ANTIBODY SCREEN: NEGATIVE

## 2015-12-30 LAB — I-STAT TROPONIN, ED: Troponin i, poc: 0.07 ng/mL (ref 0.00–0.08)

## 2015-12-30 LAB — PROTIME-INR
INR: 1.41
Prothrombin Time: 17.4 seconds — ABNORMAL HIGH (ref 11.4–15.2)

## 2015-12-30 MED ORDER — ONDANSETRON HCL 4 MG/2ML IJ SOLN: 4.0000 mg | Freq: Four times a day (QID) | INTRAMUSCULAR | Status: DC | PRN

## 2015-12-30 MED ORDER — SODIUM CHLORIDE 0.9 % IV SOLN: INTRAVENOUS | Status: DC

## 2015-12-30 MED ORDER — ACETAMINOPHEN 650 MG RE SUPP: 650.0000 mg | Freq: Four times a day (QID) | RECTAL | Status: DC | PRN

## 2015-12-30 MED ORDER — POLYVINYL ALCOHOL 1.4 % OP SOLN: 1.0000 [drp] | Freq: Four times a day (QID) | OPHTHALMIC | Status: DC | PRN

## 2015-12-30 MED ORDER — ALBUTEROL (5 MG/ML) CONTINUOUS INHALATION SOLN
10.0000 mg/h | INHALATION_SOLUTION | RESPIRATORY_TRACT | Status: DC
  Administered 2015-12-30: 10 mg/h via RESPIRATORY_TRACT
  Filled 2015-12-30: qty 20

## 2015-12-30 MED ORDER — ONDANSETRON 4 MG PO TBDP: 4.0000 mg | ORAL_TABLET | Freq: Four times a day (QID) | ORAL | Status: DC | PRN

## 2015-12-30 MED ORDER — ACETAMINOPHEN 325 MG PO TABS: 650.0000 mg | ORAL_TABLET | Freq: Four times a day (QID) | ORAL | Status: DC | PRN

## 2015-12-30 MED ORDER — SODIUM CHLORIDE 0.9 % IV BOLUS (SEPSIS)
1000.0000 mL | Freq: Once | INTRAVENOUS | Status: AC
  Administered 2015-12-30: 1000 mL via INTRAVENOUS

## 2015-12-30 MED ORDER — BIOTENE DRY MOUTH MT LIQD: 15.0000 mL | OROMUCOSAL | Status: DC | PRN

## 2016-01-07 ENCOUNTER — Ambulatory Visit: Payer: Medicare Other | Admitting: Internal Medicine

## 2016-01-09 ENCOUNTER — Ambulatory Visit: Payer: Medicare Other | Admitting: Radiation Oncology

## 2016-01-29 DIAGNOSIS — 419620001 Death: Secondary | SNOMED CT | POA: Diagnosis present

## 2016-01-29 NOTE — ED Notes (Signed)
Palliative care at bedside.

## 2016-01-29 NOTE — ED Notes (Signed)
TOD called by [facility] resident; listened to x 2 for 1 min each no cardiac activity noted

## 2016-01-29 NOTE — ED Notes (Signed)
EDP at bedside  

## 2016-01-29 NOTE — ED Notes (Signed)
Pt now not alert and min responsive with resp distress; 2 sons at bedside; EDP aware and in to speak with pt

## 2016-01-29 NOTE — ED Notes (Signed)
EDP aware of BP. 

## 2016-01-29 NOTE — Consult Note (Signed)
Consultation Note Date: 03-Jan-2016   Patient Name: [name]  DOB: February 04, 1932  MRN: [id number]  Age / Sex: 82 y.o., male  PCP: [name], MD Referring Physician: Sid Falcon, MD  Reason for Consultation: Terminal Care  HPI/Patient Profile: 82 y.o. male  with past medical history of esophageal cancer, COPD, CAD who presented to the ER on 01-03-2016 with bleeding from his peg tube and hematuria.   He was being cared for by his two sons at home.  He had recently refused hospice services.  He had multiple ER visits recently.  The last on e 9/9 for urinary retention.   We were called by the ED this am as the patient appeared to be actively dying.  Clinical Assessment and Goals of Care: The Palliative Medicine Team RN and I went to the ER, met the two sons, and examined the patient.  Within 10 minutes of our arrival the patient passed.  We remained to console the sons.   This is the first death they have experienced.  Their family is from [place].  They have 3 sisters up [place] - but appear to have only each other locally.  Chaplain arrived quickly.  An order was put in to request social work support as well.   SUMMARY OF RECOMMENDATIONS    Recommendations (Limitations, Scope, Preferences):  bereavement support for the sons.  Psycho-social/Spiritual:   Desire for further Chaplaincy support:yes  Additional Recommendations: Caregiving  Support/Resources      Primary Diagnoses: Present on Admission: . Hemoptysis   I have reviewed the medical record, interviewed the patient and family, and examined the patient. The following aspects are pertinent.  Past Medical History:  Diagnosis Date  . Anemia   . CAD (coronary artery disease)    Dr Einar Gip every 6 months  . Cardiomyopathy    EF 25% by echo 2009  . Carotid artery occlusion   . COPD (chronic obstructive pulmonary disease) ([place])    on O2  constantly  . DVT (deep venous thrombosis) ([place])   . Emphysema   . Esophageal cancer ([place])    'causes chest discomfort, liquids and solids"  . History of oxygen administration    Oxygen 3 l/m at rest / 4 l/m when active. 24/7  . Hypertension   . Imbalance   . Myocardial infarction 1986  . Peripheral vascular disease ([place])   . Pulmonary nodule    s/p wedge resction, inflammatory, +MAC  . Vitamin B 12 deficiency    Social History   Social History  . Marital status: Divorced    Spouse name: N/A  . Number of children: N/A  . Years of education: N/A   Occupational History  . Retired Retired    Charity fundraiser   Social History Main Topics  . Smoking status: Former Smoker    Packs/day: 2.00    Years: 56.00    Types: Cigarettes    Quit date: 03/30/2006  . Smokeless tobacco: Never Used  . Alcohol use No     Comment: none  since July 2017, wine caused heartburn  . Drug use: No  . Sexual activity: Not Currently   Other Topics Concern  . None   Social History Narrative  . None   Family History  Problem Relation Age of Onset  . Heart disease Father   . Diabetes Sister   . Anesthesia problems Neg Hx    Scheduled Meds:  Continuous Infusions: . sodium chloride    . albuterol 10 mg/hr (January 09, 2016 0542)   PRN Meds:.acetaminophen **OR** acetaminophen, antiseptic oral rinse, ondansetron **OR** ondansetron (ZOFRAN) IV, polyvinyl alcohol Medications Prior to Admission:  Prior to Admission medications   Medication Sig Start Date End Date Taking? Authorizing Provider  albuterol (PROVENTIL) (2.5 MG/3ML) 0.083% nebulizer solution Take 3 mLs (2.5 mg total) by nebulization 4 (four) times daily as needed. For shortness of breath 04/05/15   Tanda Rockers, MD  aspirin 81 MG chewable tablet Chew 81 mg by mouth daily. Reported on 10/11/2015    Historical Provider, MD  atorvastatin (LIPITOR) 40 MG tablet Take 40 mg by mouth daily. Reported on 10/11/2015    Historical Provider, MD  cholecalciferol  (VITAMIN D) 1000 units tablet Take 1,000 Units by mouth daily.    Historical Provider, MD  clopidogrel (PLAVIX) 75 MG tablet Take 75 mg by mouth daily. Reported on 10/11/2015    Historical Provider, MD  losartan (COZAAR) 25 MG tablet Take 25 mg by mouth daily.    Historical Provider, MD  metoCLOPramide (REGLAN) 10 MG tablet Take 1 tablet (10 mg total) by mouth 3 (three) times daily before meals. 10/30/15   Brunetta Genera, MD  metoprolol tartrate (LOPRESSOR) 25 MG tablet Take 25 mg by mouth daily. Hold if SBP < 100 08/26/15   Historical Provider, MD  Nutritional Supplements (ISOSOURCE 1.5 CAL) LIQD Take 1 Can by mouth 5 (five) times daily. Continue flushes as needed    Historical Provider, MD  ondansetron (ZOFRAN) 8 MG tablet Take 1 tablet (8 mg total) by mouth 2 (two) times daily as needed for refractory nausea / vomiting. Start on day 3 after chemo. 10/03/15   Brunetta Genera, MD  OXYGEN Inhale 3 mLs into the lungs. For Hypoxia    Historical Provider, MD  prochlorperazine (COMPAZINE) 10 MG tablet Take 1 tablet (10 mg total) by mouth every 6 (six) hours as needed (Nausea or vomiting). 10/03/15   Brunetta Genera, MD  vitamin B-12 (CYANOCOBALAMIN) 1000 MCG tablet Take 1,000 mcg by mouth daily.    Historical Provider, MD  Water For Irrigation, Sterile (FREE WATER) SOLN Place 1,080 mLs into feeding tube continuous. 11/21/15   Debbe Odea, MD  Wound Dressings (SONAFINE) Apply 1 application topically 2 (two) times daily. 10/09/15   Kyung Rudd, MD   Allergies  Allergen Reactions  . Silver   . Symbicort [Budesonide-Formoterol Fumarate] Nausea And Vomiting  . Tetanus Toxoids Other (See Comments)    In 1958 he got the tetanus shot and "blacked out"   Review of Systems Unable  Physical Exam  On presentation Patient had apneic breathing on non-rebreather mask, his color was bluish grey. CV brady  Resp: very course.   Vital Signs: BP (!) 62/38   Pulse 114   Temp (!) 96.8 F (36 C) (Rectal)    Resp (!) 27   Ht 5' 8"  (1.727 m)   Wt 58.1 kg (128 lb)   SpO2 100%   BMI 19.46 kg/m      Pain Score: 0-No pain   SpO2: SpO2: 100 %  O2 Device:SpO2: 100 % O2 Flow Rate: .O2 Flow Rate (L/min): 15 L/min  IO: Intake/output summary:  Intake/Output Summary (Last 24 hours) at 12-31-15 5183 Last data filed at 31-Dec-2015 3582  Gross per 24 hour  Intake             1000 ml  Output                0 ml  Net             1000 ml    LBM:   Baseline Weight: Weight: 58.1 kg (128 lb) Most recent weight: Weight: 58.1 kg (128 lb)     Palliative Assessment/Data:   Flowsheet Rows   Flowsheet Row Most Recent Value  Intake Tab  Unit at Time of Referral  ER  Palliative Care Primary Diagnosis  Cancer  Date Notified  12-31-15  Palliative Care Type  New Palliative care  Reason for referral  End of Life Care Assistance  Date first seen by Palliative Care  12/31/15  # of days Palliative referral response time  0 Day(s)  Clinical Assessment  Palliative Performance Scale Score  10%  Psychosocial & Spiritual Assessment  Palliative Care Outcomes  Patient/Family meeting held?  Yes  Who was at the meeting?  patient and two sons  Palliative Care Outcomes  Changed to focus on comfort      Time In: 9:00 Time Out: 9:30 Time Total: 30 min. Greater than 50%  of this time was spent counseling and coordinating care related to the above assessment and plan.  Signed by: Imogene Burn, PA-C Palliative Medicine Pager: [phone]   Please contact Palliative Medicine Team phone at [phone] for questions and concerns.  For individual provider: See Shea Evans

## 2016-01-29 NOTE — Progress Notes (Signed)
   10-Jan-2016 0900  Clinical Encounter Type  Visited With Family;Health care provider  Visit Type Death  Referral From Palliative care team  Spiritual Encounters  Spiritual Needs Emotional;Grief support  Stress Factors  Patient Stress Factors None identified  Family Stress Factors Family relationships;Loss    Chaplain responded to page in ED for patient that had died. Two sons present, offered prayer and information re: funeral homes.

## 2016-01-29 NOTE — Consult Note (Addendum)
WOC consult requested for bleeding PEG tube.  Pt has expired at this time, according to the EMR notes. Thank-you,  Julien Girt MSN, [place], [place], [place], [place]

## 2016-01-29 NOTE — Discharge Summary (Signed)
  Name: [name] MRN: [id number] DOB: 1932-03-16 82 y.o.  Date of Admission: 01/09/2016  5:09 AM Date of Discharge: Jan 09, 2016 Attending Physician: Dr. Daryll Drown  Discharge Diagnosis: Principal Problem:   Hemoptysis Active Problems:   COPD  GOLD III    Carcinoma of lower third of esophagus (HCC)   PEG tube malfunction ([place])   Adult failure to thrive   Antineoplastic chemotherapy induced pancytopenia (HCC)   CAD (coronary artery disease)   Death  Cause of death: Stage 3 Esophageal Cancer with Hemoptysis and Hypotension  Time of death: 9:10 am  Disposition and follow-up:   [name] was discharged from [facility] in expired condition.    Hospital Course: 82 year old male with past medical history esophageal cancer, CAD status post CABG, CHF with EF of 35-40% with grade 1 diastolic dysfunction, PVD, failure to thrive, and COPD on 3L O2 home who presented with hemoptysis. He lived with his son who stated that this morning the patient was coughing up blood clots on to his bedsheets. Patient was also had a difficulty breathing and EMS was then called. His son also noted that he had blood around his PEG tube and tube was filled with blood. On arrival to the emergency department patient's blood pressure was 62/30 and he was given 1 L of normal saline, and he required a nonrebreather. He was able to tell the ED provider that he did not want intubation or CPR. On first examination, patient was unarousable to painful stimuli but appeared to be breathing comfortably on a nonrebreather mask.  Upon further discussion with his 2 sons one of whom he lives with,  the patient had failure to thrive and was recently admitted to [facility] skilled nursing facility 2-1/2 weeks ago where he stated that he was "tired of feeling bad," and refusing to eat as well as repeatedly attempted to pull out his PEG tube. For the past month patient has been wheelchair bound secondary to increased  weakness from his chemotherapy treatments. He is dependent for all of his ADLs. His sons state that he did not like being at the SNF. Code status was discussed with his sons and they agreed that the patient would want to be DNR. He was started on morphine and NS 50 cc/hr. Patient was re-evaluated at 0900 on 10/2 still in the ED. Upon arrival to the room, patient was unresponsive, apneic. Both both myself and my attending, Dr. Daryll Drown, listened for heart sounds and breath sounds for 2 minutes without evidence of either. Time of death was 0910.   Signed: Martyn Malay, DO PGY-3 Internal Medicine Resident Pager # [phone] January 09, 2016 9:16 PM

## 2016-01-29 NOTE — H&P (Signed)
Date: 03-Jan-2016               Patient Name:  [name] MRN: [id number]  DOB: [birth year] Age / Sex: 82 y.o., male   PCP: [name], MD         Medical Service: Internal Medicine Teaching Service         Attending Physician: Dr. Sid Falcon, MD    First Contact: Dr. Reesa Chew Pager: [phone]  Second Contact: Dr. Quay Burow Pager: [phone]       After Hours (After 5p/  First Contact Pager: [phone]  weekends / holidays): Second Contact Pager: [phone]   Chief Complaint: Hemoptysis  History of Present Illness: 82 year old male with past medical history esophageal cancer, CAD status post CABG, CHF with EF of 35-40% with grade 1 diastolic dysfunction, PVD, failure to thrive, and COPD on 3L O2 home who presents with hemoptysis. He lives with his son he states that this morning the patient was coughing up blood clots on to his bedsheets. Patient was also had a difficulty breathing and EMS was then called. His son also noted that he had blood around his PEG tube and tube was filled with blood. On arrival to the emergency department patient's blood pressure was 62/30 and he was given 1 L of normal saline, and he required a nonrebreather. He was able to tell the ED provider that he did not want intubation or CPR. Currently patient is unarousable to painful stimuli but appear to be breathing comfortably on a nonrebreather mask.  Current discussion with his 2 sons one of whom he lives with  the patient has had failure to thrive and was recently admitted to [facility] skilled nursing facility 2-1/2 weeks ago where he stated that he was "tired of feeling bad," and refusing to eat as well as attempted to pull out his PEG tube. For the past month patient has been wheelchair bound secondary to increased weakness from his chemotherapy treatments. He is dependent for all of his ADLs. His sons state that he did not like being at the SNF. We discussed code status which his sons agree with and options of IVFs and  morphine for air hunger. His son feels that patient just needs nutrition as he has not been eating well due to dysgeusia and has been refusing to eat. Per chart review he has not been able to tolerate recommended tube feeding and free water supplementation. We discussed that BIPAP and pressors would not be a good option for patient which they agree with. They do not want to do anything aggressive to prolong his suffering or make him uncomfortable.    Meds: Unable to obtain outside med information 2/2 patient status.   Allergies: Allergies as of 01-03-2016 - Review Complete 2016-01-03  Allergen Reaction Noted  . Silver  11/22/2015  . Symbicort [budesonide-formoterol fumarate] Nausea And Vomiting 09/23/2015  . Tetanus toxoids Other (See Comments) 04/06/2011   Past Medical History:  Diagnosis Date  . Anemia   . CAD (coronary artery disease)    Dr Einar Gip every 6 months  . Cardiomyopathy    EF 25% by echo 2009  . Carotid artery occlusion   . COPD (chronic obstructive pulmonary disease) ([place])    on O2 constantly  . DVT (deep venous thrombosis) ([place])   . Emphysema   . Esophageal cancer ([place])    'causes chest discomfort, liquids and solids"  . History of oxygen administration    Oxygen 3 l/m at rest /  4 l/m when active. 24/7  . Hypertension   . Imbalance   . Myocardial infarction 1986  . Peripheral vascular disease ([place])   . Pulmonary nodule    s/p wedge resction, inflammatory, +MAC  . Vitamin B 12 deficiency     Family History:  Family History  Problem Relation Age of Onset  . Heart disease Father   . Diabetes Sister   . Anesthesia problems Neg Hx     Social History:  Social History   Social History  . Marital status: Divorced    Spouse name: N/A  . Number of children: N/A  . Years of education: N/A   Occupational History  . Retired Retired    Charity fundraiser   Social History Main Topics  . Smoking status: Former Smoker    Packs/day: 2.00    Years: 56.00    Types:  Cigarettes    Quit date: 03/30/2006  . Smokeless tobacco: Never Used  . Alcohol use No     Comment: none since July 2017, wine caused heartburn  . Drug use: No  . Sexual activity: Not Currently   Other Topics Concern  . Not on file   Social History Narrative  . No narrative on file     Review of Systems: Unable to obtain due to patient factors  Physical Exam: Blood pressure (!) 62/38, pulse 114, temperature (!) 96.8 F (36 C), temperature source Rectal, resp. rate (!) 27, height 5\' 8"  (1.727 m), weight 128 lb (58.1 kg), SpO2 100 %. General: unarousable to painful stimuli, thin, cachetic, on non rebreather  Lungs: clear to anterior auscultation, no wheezing Cardiac: RRR, no murmurs, rubs, gallops GI: soft, hypoactive bowel sounds, PEG tube in place with bloody dressings around tube but no active bleeding  Extremities:  Poor skin turgor, no edema  Skin: Mottled lower extremities, dusky pink color on his chest  CXR: Emphysematous changes in the lungs. No evidence of active pulmonary disease.  Assessment & Plan by Problem: Active Problems:   Hemoptysis  Hemoptysis- patient with episode of coughing up blood this morning. His hemoglobin is 8.7 down from 12.2 September 9. His baseline hemoglobin is around 11-12. He was recently admitted to the hospital in August with pancytopenia secondary to chemotherapy treatments and has last chemotherapy treatment has been held secondary to this. Likely his acute drop in hemoglobin is from a combination of hemoptysis, bleeding from his PEG tube, and anemia of chronic disease. Pt is not having any active bleeding at this time. - We will check a CBC at noon and discuss blood transfusion with his sons if hemoglobin less than 7-8. - type and screen  - NS at 50 cc/hr - palliative care consulted, appreciate their recommendations.   Hypotension-- BP 62/38 2/2 hypovolemia from poor po intake and blood loss. Pt was given 1L of NS in the ED. - NS at 50  cc/hr   AMS- 2/2 to hypotension. ABG w/ pH of 7.327, calcium wnl. CXR neg for signs of infection.   CHF- EF 35-40% on ECHO 10/11/2015. Pt appears dry   Cancer of lower third of esophagus-- follows w/ Dr. Irene Limbo w/ oncology. received 4/5 cycles of carboplatin/Taxol chemotherapy on 11/06/2015. He was unable to receive 5th tx due to pancytopenia and weakness. He is not a candidate for sx due to advanced oxygen depenedent COPD.  - will route this admission note to his oncologist.   Failure to thrive-- Per chart review pt weighed 140lbs on 11/14/15, today in the ED  he weights 128lbs. He has been refusing po intake and having difficulty tolerate tube feeds. This is also complicated by intolerability of his chemo treatments.  - palliative care consulted.  - consulted wound/ostomy nurse for PEG tube  Code- DNR. We discussed futility in agressive treatment with pressors and his AMS is a CI to BIPAP. We discussed starting morphine if patient develops air hunger but currently he looks comfortable on a non rebreather mask. He sons state that when attempting to discuss a living will he got angry and stated "im going to beat this thing." Thus they feel torn on full comfort care at the moment. I told his sons that they do not need to make an immediate decision and that we will start IVFs and touch base with them this afternoon.    Dispo: Admit patient to Observation with expected length of stay less than 2 midnights.  Signed: Norman Herrlich, MD 01/25/2016, 8:18 AM  Pager: [phone]

## 2016-01-29 NOTE — ED Provider Notes (Signed)
[facility] DEPT Provider Note   CSN: [id number] Arrival date & time: January 19, 2016  0509     History   Chief Complaint Chief Complaint  Patient presents with  . Cough  . Weakness    HPI [name] is a 82 y.o. male.  The history is provided by the patient, the EMS personnel and a relative. No language interpreter was used.   [name] is a 82 y.o. male who presents to the Emergency Department complaining of hemoptysis.  Level V caveat due to respiratory distress.  He has a hx/o stage III esophageal cancer and failure to thrive.  Since yesterday he has experienced profound weakness and this morning developed significant hemoptysis and blood from his PEG tube.  He reports sob.  No fevers, vomiting, diarrhea.   Past Medical History:  Diagnosis Date  . Anemia   . CAD (coronary artery disease)    Dr Einar Gip every 6 months  . Cardiomyopathy    EF 25% by echo 2009  . Carotid artery occlusion   . COPD (chronic obstructive pulmonary disease) ([place])    on O2 constantly  . DVT (deep venous thrombosis) ([place])   . Emphysema   . Esophageal cancer ([place])    'causes chest discomfort, liquids and solids"  . History of oxygen administration    Oxygen 3 l/m at rest / 4 l/m when active. 24/7  . Hypertension   . Imbalance   . Myocardial infarction 1986  . Peripheral vascular disease ([place])   . Pulmonary nodule    s/p wedge resction, inflammatory, +MAC  . Vitamin B 12 deficiency     Patient Active Problem List   Diagnosis Date Noted  . Hemoptysis 01-19-16  . Nausea with vomiting 11/14/2015  . PEG tube malfunction ([place]) 11/14/2015  . Adult failure to thrive 11/14/2015  . Antineoplastic chemotherapy induced pancytopenia ([place]) 11/14/2015  . Hypomagnesemia 11/14/2015  . CHF (congestive heart failure) ([place]) 11/14/2015  . CAD (coronary artery disease) 11/14/2015  . Acid reflux 11/14/2015  . Severe protein-calorie malnutrition ([place]) 10/09/2015  . Unintentional weight loss 10/04/2015  .  Dehydration 10/04/2015  . Carcinoma of lower third of esophagus ([place]) 09/23/2015  . Occlusion and stenosis of carotid artery without mention of cerebral infarction 09/07/2011  . Chronic respiratory failure with hypoxia ([place]) 10/21/2007  . COPD  GOLD III  03/16/2007  . PULMONARY NODULE 03/16/2007    Past Surgical History:  Procedure Laterality Date  . ARCH AORTOGRAM N/A 07/21/2011   Procedure: ARCH AORTOGRAM;  Surgeon: Laverda Page, MD;  Location: [facility] CATH LAB;  Service: Cardiovascular;  Laterality: N/A;  . CAROTID ENDARTERECTOMY  2009   triple bypass  . CAROTID-VERTEBRAL BYPASS GRAFT  08/20/2011   Procedure: BYPASS GRAFT CAROTID-VERTEBRAL;  Surgeon: Serafina Mitchell, MD;  Location: [facility] OR;  Service: Vascular;  Laterality: Right;  Right CAROTID-VERTEBRAL TRANSPOSITION  . CORONARY ARTERY BYPASS GRAFT  2009  . ESOPHAGOGASTRODUODENOSCOPY (EGD) WITH PROPOFOL N/A 09/19/2015   Procedure: ESOPHAGOGASTRODUODENOSCOPY (EGD) WITH PROPOFOL;  Surgeon: Clarene Essex, MD;  Location: [facility] ENDOSCOPY;  Service: Endoscopy;  Laterality: N/A;  . IR GENERIC HISTORICAL  11/14/2015   IR CM INJ ANY COLONIC TUBE W/FLUORO 11/14/2015 Darrell K Allred, PA-C [facility]-INTERV RAD  . LUNG SURGERY  2008   tumor"infection"  . PR VEIN BYPASS GRAFT,AORTO-FEM-POP    . SAVORY DILATION N/A 09/19/2015   Procedure: SAVORY DILATION;  Surgeon: Clarene Essex, MD;  Location: [facility] ENDOSCOPY;  Service: Endoscopy;  Laterality: N/A;  needs carm  . VASCULAR SURGERY  Home Medications    Prior to Admission medications   Medication Sig Start Date End Date Taking? Authorizing Provider  albuterol (PROVENTIL) (2.5 MG/3ML) 0.083% nebulizer solution Take 3 mLs (2.5 mg total) by nebulization 4 (four) times daily as needed. For shortness of breath 04/05/15   Tanda Rockers, MD  aspirin 81 MG chewable tablet Chew 81 mg by mouth daily. Reported on 10/11/2015    Historical Provider, MD  atorvastatin (LIPITOR) 40 MG tablet Take 40 mg by mouth daily. Reported  on 10/11/2015    Historical Provider, MD  cholecalciferol (VITAMIN D) 1000 units tablet Take 1,000 Units by mouth daily.    Historical Provider, MD  clopidogrel (PLAVIX) 75 MG tablet Take 75 mg by mouth daily. Reported on 10/11/2015    Historical Provider, MD  losartan (COZAAR) 25 MG tablet Take 25 mg by mouth daily.    Historical Provider, MD  metoCLOPramide (REGLAN) 10 MG tablet Take 1 tablet (10 mg total) by mouth 3 (three) times daily before meals. 10/30/15   Brunetta Genera, MD  metoprolol tartrate (LOPRESSOR) 25 MG tablet Take 25 mg by mouth daily. Hold if SBP < 100 08/26/15   Historical Provider, MD  Nutritional Supplements (ISOSOURCE 1.5 CAL) LIQD Take 1 Can by mouth 5 (five) times daily. Continue flushes as needed    Historical Provider, MD  ondansetron (ZOFRAN) 8 MG tablet Take 1 tablet (8 mg total) by mouth 2 (two) times daily as needed for refractory nausea / vomiting. Start on day 3 after chemo. 10/03/15   Brunetta Genera, MD  OXYGEN Inhale 3 mLs into the lungs. For Hypoxia    Historical Provider, MD  prochlorperazine (COMPAZINE) 10 MG tablet Take 1 tablet (10 mg total) by mouth every 6 (six) hours as needed (Nausea or vomiting). 10/03/15   Brunetta Genera, MD  vitamin B-12 (CYANOCOBALAMIN) 1000 MCG tablet Take 1,000 mcg by mouth daily.    Historical Provider, MD  Water For Irrigation, Sterile (FREE WATER) SOLN Place 1,080 mLs into feeding tube continuous. 11/21/15   Debbe Odea, MD  Wound Dressings (SONAFINE) Apply 1 application topically 2 (two) times daily. 10/09/15   Kyung Rudd, MD    Family History Family History  Problem Relation Age of Onset  . Heart disease Father   . Diabetes Sister   . Anesthesia problems Neg Hx     Social History Social History  Substance Use Topics  . Smoking status: Former Smoker    Packs/day: 2.00    Years: 56.00    Types: Cigarettes    Quit date: 03/30/2006  . Smokeless tobacco: Never Used  . Alcohol use No     Comment: none since July  2017, wine caused heartburn     Allergies   Silver; Symbicort [budesonide-formoterol fumarate]; and Tetanus toxoids   Review of Systems Review of Systems  All other systems reviewed and are negative.    Physical Exam Updated Vital Signs BP (!) 62/38   Pulse 114   Temp (!) 96.8 F (36 C) (Rectal)   Resp (!) 27   Ht 5\' 8"  (1.727 m)   Wt 128 lb (58.1 kg)   SpO2 100%   BMI 19.46 kg/m   Physical Exam  Constitutional: He appears well-developed. He appears distressed.  Frail and cachectic  HENT:  Head: Normocephalic and atraumatic.  Dried blood in OP, dry mucous membranes  Cardiovascular: Regular rhythm.   No murmur heard. tachycardic  Pulmonary/Chest: He is in respiratory distress.  Tachypnea with decreased  air movement bilaterally  Abdominal: Soft. There is no tenderness. There is no rebound and no guarding.  PEG in upper abdomen with dark blood surrounding tube, in tube.   Musculoskeletal: He exhibits no edema or tenderness.  Neurological:  Lethargic, profound generalized weakness  Skin: Capillary refill takes more than 3 seconds.  Cool extremities, mottling diffusely  Psychiatric:  Unable to assess  Nursing note and vitals reviewed.    ED Treatments / Results  Labs (all labs ordered are listed, but only abnormal results are displayed) Labs Reviewed  CBC WITH DIFFERENTIAL/PLATELET - Abnormal; Notable for the following:       Result Value   WBC 10.7 (*)    RBC 3.01 (*)    Hemoglobin 8.7 (*)    HCT 28.5 (*)    RDW 17.7 (*)    Lymphs Abs 4.3 (*)    All other components within normal limits  COMPREHENSIVE METABOLIC PANEL - Abnormal; Notable for the following:    CO2 16 (*)    Glucose, Bld 116 (*)    BUN 34 (*)    Creatinine, Ser 1.80 (*)    Total Protein 6.1 (*)    Albumin 2.6 (*)    AST 48 (*)    GFR calc non Af Amer 33 (*)    GFR calc Af Amer 39 (*)    Anion gap 21 (*)    All other components within normal limits  PROTIME-INR - Abnormal; Notable  for the following:    Prothrombin Time 17.4 (*)    All other components within normal limits  I-STAT ARTERIAL BLOOD GAS, ED - Abnormal; Notable for the following:    pH, Arterial 7.327 (*)    pCO2 arterial 26.3 (*)    pO2, Arterial 250.0 (*)    Bicarbonate 14.0 (*)    Acid-base deficit 11.0 (*)    All other components within normal limits  I-STAT CHEM 8, ED - Abnormal; Notable for the following:    BUN 34 (*)    Creatinine, Ser 1.70 (*)    Glucose, Bld 113 (*)    Calcium, Ion 0.99 (*)    Hemoglobin 8.5 (*)    HCT 25.0 (*)    All other components within normal limits  I-STAT TROPOININ, ED  TYPE AND SCREEN    EKG  EKG Interpretation None       Radiology Dg Chest Port 1 View  Result Date: Jan 10, 2016 CLINICAL DATA:  Shortness of breath and hemoptysis. History of cancer. EXAM: PORTABLE CHEST 1 VIEW COMPARISON:  11/14/2015 FINDINGS: Postoperative changes in the mediastinum. Normal heart size and pulmonary vascularity. Right power port type central venous catheter with tip over the low SVC region. There appears to be a gastrostomy tube in the left upper quadrant at the level of the esophagogastric junction. Emphysematous changes in the lungs with scattered fibrosis. Postoperative changes in the left mid lung. No airspace disease or consolidation. No blunting of costophrenic angles. No pneumothorax. Calcification of the aorta. IMPRESSION: Emphysematous changes in the lungs. No evidence of active pulmonary disease. Electronically Signed   By: Lucienne Capers M.D.   On: Jan 10, 2016 06:11    Procedures Procedures (including critical care time) CRITICAL CARE Performed by: Quintella Reichert   Total critical care time: 35 minutes  Critical care time was exclusive of separately billable procedures and treating other patients.  Critical care was necessary to treat or prevent imminent or life-threatening deterioration.  Critical care was time spent personally by me on the following  activities: development of treatment plan with patient and/or surrogate as well as nursing, discussions with consultants, evaluation of patient's response to treatment, examination of patient, obtaining history from patient or surrogate, ordering and performing treatments and interventions, ordering and review of laboratory studies, ordering and review of radiographic studies, pulse oximetry and re-evaluation of patient's condition.   Medications Ordered in ED Medications  albuterol (PROVENTIL,VENTOLIN) solution continuous neb (10 mg/hr Nebulization New Bag/Given 2016/01/02 0542)  acetaminophen (TYLENOL) tablet 650 mg (not administered)    Or  acetaminophen (TYLENOL) suppository 650 mg (not administered)  ondansetron (ZOFRAN-ODT) disintegrating tablet 4 mg (not administered)    Or  ondansetron (ZOFRAN) injection 4 mg (not administered)  antiseptic oral rinse (BIOTENE) solution 15 mL (not administered)  polyvinyl alcohol (LIQUIFILM TEARS) 1.4 % ophthalmic solution 1 drop (not administered)  sodium chloride 0.9 % bolus 1,000 mL (0 mLs Intravenous Stopped 01-02-16 0625)     Initial Impression / Assessment and Plan / ED Course  I have reviewed the triage vital signs and the nursing notes.  Pertinent labs & imaging results that were available during my care of the patient were reviewed by me and considered in my medical decision making (see chart for details).  Clinical Course    Pt with hx/o cancer and FTT here with hemoptysis and dyspnea.  Pt in shock with respiratory distress, mottling on exam.  Pt has recently declined hospice care and family state that he thought that he would get better and he did not want nurses at home caring for him.  When discussed with patient that he was actively dying he was clear that he did not want intubation. Plan to admit for comfort measures.  Discussed with IM teaching service, plan to admit for comfort measures.  D/w palliative care physician who will see the  patient in consult.     Final Clinical Impressions(s) / ED Diagnoses   Final diagnoses:  Acute on chronic respiratory failure with hypoxia [facility])    New Prescriptions New Prescriptions   No medications on file     Quintella Reichert, MD Jan 02, 2016 [phone]

## 2016-01-29 NOTE — ED Notes (Signed)
Spoke with family offered chaplain but declined at present

## 2016-01-29 NOTE — ED Triage Notes (Addendum)
Received pt from home with c/o per son pt had generalized weakness at 10 pm last night. This monring pt woke up coughing up blood. Pt has history of throat cancer. Pulse ox for EMS was 73% on 3L, pt placed on NRB mask and [place] O2 by EMS with a pulse ox of 100%.

## 2016-01-29 DEATH — deceased

## 2016-03-07 ENCOUNTER — Other Ambulatory Visit: Payer: Self-pay | Admitting: Nurse Practitioner

## 2018-03-24 IMAGING — US IR US GUIDE VASC ACCESS RIGHT
1 series · 1 of 1 positions shown · non-contrast
Comparison: PET-CT - 09/27/2015

INDICATION: History of esophageal carcinoma. In need of durable intravenous
access for chemotherapy administration.

EXAM:
IMPLANTED PORT A CATH PLACEMENT WITH ULTRASOUND AND FLUOROSCOPIC
GUIDANCE

[Series 1: ir fluoro/shunt/fist · 1 of 1 slices shown]
[im 1/1]
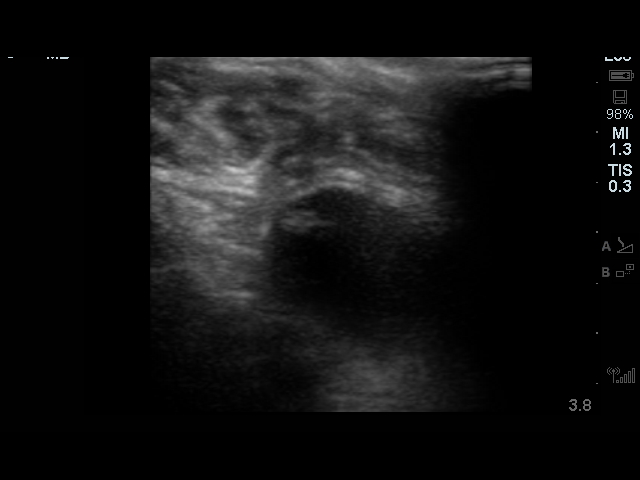

[1 of 1 positions shown; findings below may reference images not displayed]

MEDICATIONS:
Ancef 2 gm IV; The antibiotic was administered within an appropriate
time interval prior to skin puncture.

ANESTHESIA/SEDATION:
Moderate (conscious) sedation was employed during this procedure. A
total of Versed 2 mg and Fentanyl 100 mcg was administered
intravenously.

Moderate Sedation Time: 31 minutes. The patient's level of
consciousness and vital signs were monitored continuously by
radiology nursing throughout the procedure under my direct
supervision.

CONTRAST:  None

FLUOROSCOPY TIME:  24 seconds (5 mGy)

COMPLICATIONS:
None immediate.

PROCEDURE:
The procedure, risks, benefits, and alternatives were explained to
the patient. Questions regarding the procedure were encouraged and
answered. The patient understands and consents to the procedure.

The right neck and chest were prepped with chlorhexidine in a
sterile fashion, and a sterile drape was applied covering the
operative field. Maximum barrier sterile technique with sterile
gowns and gloves were used for the procedure. A timeout was
performed prior to the initiation of the procedure. Local anesthesia
was provided with 1% lidocaine with epinephrine.

After creating a small venotomy incision, a micropuncture kit was
utilized to access the internal jugular vein under direct, real-time
ultrasound guidance. Ultrasound image documentation was performed.
The microwire was kinked to measure appropriate catheter length.

A subcutaneous port pocket was then created along the upper chest
wall utilizing a combination of sharp and blunt dissection. The
pocket was irrigated with sterile saline. A single lumen ISP power
injectable port was chosen for placement. The 8 Fr catheter was
tunneled from the port pocket site to the venotomy incision. The
port was placed in the pocket. The external catheter was trimmed to
appropriate length. At the venotomy, an 8 Fr peel-away sheath was
placed over a guidewire under fluoroscopic guidance. The catheter
was then placed through the sheath and the sheath was removed. Final
catheter positioning was confirmed and documented with a
fluoroscopic spot radiograph. The port was accessed with Soonjae Varndell
needle, aspirated and flushed with heparinized saline.

The venotomy site was closed with an interrupted 4-0 Vicryl suture.
The port pocket incision was closed with interrupted 2-0 Vicryl
suture and the skin was opposed with a running subcuticular 4-0
Vicryl suture. Dermabond and Jantimir were applied to both
incisions. Dressings were placed. The patient tolerated the
procedure well without immediate post procedural complication.
FINDINGS: After catheter placement, the tip lies within the superior
cavoatrial junction. The catheter aspirates and flushes normally and
is ready for immediate use.
IMPRESSION: Successful placement of a right internal jugular approach power
injectable Port-A-Cath. The catheter is ready for immediate use.
